# Patient Record
Sex: Female | Born: 1970
Health system: Southern US, Community
[De-identification: ages and names within clinical notes are randomized; demographics above are authoritative.]

## PROBLEM LIST (undated history)

## (undated) DIAGNOSIS — B1910 Unspecified viral hepatitis B without hepatic coma: Secondary | ICD-10-CM

## (undated) DIAGNOSIS — E119 Type 2 diabetes mellitus without complications: Secondary | ICD-10-CM

## (undated) DIAGNOSIS — F419 Anxiety disorder, unspecified: Secondary | ICD-10-CM

## (undated) DIAGNOSIS — C539 Malignant neoplasm of cervix uteri, unspecified: Secondary | ICD-10-CM

## (undated) DIAGNOSIS — F410 Panic disorder [episodic paroxysmal anxiety] without agoraphobia: Secondary | ICD-10-CM

## (undated) DIAGNOSIS — F319 Bipolar disorder, unspecified: Secondary | ICD-10-CM

## (undated) DIAGNOSIS — IMO0002 Reserved for concepts with insufficient information to code with codable children: Secondary | ICD-10-CM

## (undated) DIAGNOSIS — N390 Urinary tract infection, site not specified: Secondary | ICD-10-CM

## (undated) DIAGNOSIS — N83209 Unspecified ovarian cyst, unspecified side: Secondary | ICD-10-CM

## (undated) HISTORY — PX: CERVIX SURGERY: SHX593

## (undated) HISTORY — PX: TUBAL LIGATION: SHX77

## (undated) HISTORY — PX: WISDOM TOOTH EXTRACTION: SHX21

## (undated) HISTORY — PX: MANDIBLE FRACTURE SURGERY: SHX706

---

## 1997-11-08 ENCOUNTER — Emergency Department (HOSPITAL_COMMUNITY): Admission: EM | Admit: 1997-11-08 | Discharge: 1997-11-08 | Payer: Self-pay | Admitting: Emergency Medicine

## 1998-01-11 ENCOUNTER — Encounter: Admission: RE | Admit: 1998-01-11 | Discharge: 1998-01-11 | Payer: Self-pay | Admitting: Family Medicine

## 1998-02-01 ENCOUNTER — Encounter: Admission: RE | Admit: 1998-02-01 | Discharge: 1998-02-01 | Payer: Self-pay | Admitting: Family Medicine

## 1998-05-08 ENCOUNTER — Emergency Department (HOSPITAL_COMMUNITY): Admission: EM | Admit: 1998-05-08 | Discharge: 1998-05-08 | Payer: Self-pay | Admitting: Emergency Medicine

## 1998-05-09 ENCOUNTER — Encounter: Admission: RE | Admit: 1998-05-09 | Discharge: 1998-05-09 | Payer: Self-pay | Admitting: Family Medicine

## 1998-05-17 ENCOUNTER — Encounter: Admission: RE | Admit: 1998-05-17 | Discharge: 1998-05-17 | Payer: Self-pay | Admitting: Family Medicine

## 1998-05-28 ENCOUNTER — Encounter: Admission: RE | Admit: 1998-05-28 | Discharge: 1998-05-28 | Payer: Self-pay | Admitting: Family Medicine

## 1998-07-15 ENCOUNTER — Inpatient Hospital Stay (HOSPITAL_COMMUNITY): Admission: EM | Admit: 1998-07-15 | Discharge: 1998-07-17 | Payer: Self-pay | Admitting: Emergency Medicine

## 1998-07-18 ENCOUNTER — Encounter: Admission: RE | Admit: 1998-07-18 | Discharge: 1998-07-18 | Payer: Self-pay | Admitting: Family Medicine

## 1998-08-23 ENCOUNTER — Encounter: Admission: RE | Admit: 1998-08-23 | Discharge: 1998-08-23 | Payer: Self-pay | Admitting: Family Medicine

## 1999-03-10 ENCOUNTER — Encounter: Admission: RE | Admit: 1999-03-10 | Discharge: 1999-03-10 | Payer: Self-pay | Admitting: Family Medicine

## 1999-12-27 ENCOUNTER — Emergency Department (HOSPITAL_COMMUNITY): Admission: EM | Admit: 1999-12-27 | Discharge: 1999-12-27 | Payer: Self-pay | Admitting: Emergency Medicine

## 1999-12-28 ENCOUNTER — Emergency Department (HOSPITAL_COMMUNITY): Admission: EM | Admit: 1999-12-28 | Discharge: 1999-12-28 | Payer: Self-pay | Admitting: Emergency Medicine

## 1999-12-30 ENCOUNTER — Emergency Department (HOSPITAL_COMMUNITY): Admission: EM | Admit: 1999-12-30 | Discharge: 1999-12-30 | Payer: Self-pay | Admitting: Emergency Medicine

## 1999-12-30 ENCOUNTER — Encounter: Payer: Self-pay | Admitting: Emergency Medicine

## 2000-03-15 ENCOUNTER — Encounter: Admission: RE | Admit: 2000-03-15 | Discharge: 2000-03-15 | Payer: Self-pay | Admitting: Family Medicine

## 2000-04-09 ENCOUNTER — Encounter: Admission: RE | Admit: 2000-04-09 | Discharge: 2000-04-09 | Payer: Self-pay | Admitting: Family Medicine

## 2002-08-04 ENCOUNTER — Encounter: Admission: RE | Admit: 2002-08-04 | Discharge: 2002-08-04 | Payer: Self-pay | Admitting: Family Medicine

## 2002-08-13 ENCOUNTER — Emergency Department (HOSPITAL_COMMUNITY): Admission: EM | Admit: 2002-08-13 | Discharge: 2002-08-13 | Payer: Self-pay | Admitting: Emergency Medicine

## 2002-08-13 ENCOUNTER — Encounter: Payer: Self-pay | Admitting: Emergency Medicine

## 2002-09-18 ENCOUNTER — Encounter: Admission: RE | Admit: 2002-09-18 | Discharge: 2002-09-18 | Payer: Self-pay | Admitting: Family Medicine

## 2002-10-02 ENCOUNTER — Emergency Department (HOSPITAL_COMMUNITY): Admission: EM | Admit: 2002-10-02 | Discharge: 2002-10-02 | Payer: Self-pay | Admitting: Emergency Medicine

## 2002-10-02 ENCOUNTER — Encounter: Payer: Self-pay | Admitting: Emergency Medicine

## 2003-06-18 ENCOUNTER — Encounter: Admission: RE | Admit: 2003-06-18 | Discharge: 2003-06-18 | Payer: Self-pay | Admitting: Sports Medicine

## 2003-06-28 ENCOUNTER — Encounter: Admission: RE | Admit: 2003-06-28 | Discharge: 2003-06-28 | Payer: Self-pay | Admitting: Family Medicine

## 2003-10-30 ENCOUNTER — Emergency Department (HOSPITAL_COMMUNITY): Admission: EM | Admit: 2003-10-30 | Discharge: 2003-10-30 | Payer: Self-pay | Admitting: Emergency Medicine

## 2003-12-06 ENCOUNTER — Encounter: Admission: RE | Admit: 2003-12-06 | Discharge: 2003-12-06 | Payer: Self-pay | Admitting: Family Medicine

## 2003-12-12 ENCOUNTER — Emergency Department (HOSPITAL_COMMUNITY): Admission: EM | Admit: 2003-12-12 | Discharge: 2003-12-12 | Payer: Self-pay | Admitting: *Deleted

## 2004-03-10 ENCOUNTER — Emergency Department (HOSPITAL_COMMUNITY): Admission: EM | Admit: 2004-03-10 | Discharge: 2004-03-10 | Payer: Self-pay | Admitting: Emergency Medicine

## 2005-03-02 ENCOUNTER — Emergency Department (HOSPITAL_COMMUNITY): Admission: EM | Admit: 2005-03-02 | Discharge: 2005-03-02 | Payer: Self-pay | Admitting: Emergency Medicine

## 2005-03-23 ENCOUNTER — Ambulatory Visit: Payer: Self-pay | Admitting: Family Medicine

## 2005-11-14 ENCOUNTER — Emergency Department (HOSPITAL_COMMUNITY): Admission: EM | Admit: 2005-11-14 | Discharge: 2005-11-14 | Payer: Self-pay | Admitting: Emergency Medicine

## 2005-12-11 ENCOUNTER — Emergency Department (HOSPITAL_COMMUNITY): Admission: EM | Admit: 2005-12-11 | Discharge: 2005-12-11 | Payer: Self-pay | Admitting: Emergency Medicine

## 2006-01-22 ENCOUNTER — Ambulatory Visit: Payer: Self-pay | Admitting: Family Medicine

## 2006-03-19 ENCOUNTER — Emergency Department (HOSPITAL_COMMUNITY): Admission: EM | Admit: 2006-03-19 | Discharge: 2006-03-19 | Payer: Self-pay | Admitting: Emergency Medicine

## 2006-03-22 ENCOUNTER — Ambulatory Visit: Payer: Self-pay | Admitting: Family Medicine

## 2006-05-01 ENCOUNTER — Encounter (INDEPENDENT_AMBULATORY_CARE_PROVIDER_SITE_OTHER): Payer: Self-pay | Admitting: *Deleted

## 2006-05-17 ENCOUNTER — Encounter (INDEPENDENT_AMBULATORY_CARE_PROVIDER_SITE_OTHER): Payer: Self-pay | Admitting: *Deleted

## 2006-05-17 ENCOUNTER — Ambulatory Visit: Payer: Self-pay | Admitting: Family Medicine

## 2006-07-29 DIAGNOSIS — M545 Low back pain: Secondary | ICD-10-CM

## 2006-07-29 DIAGNOSIS — F3160 Bipolar disorder, current episode mixed, unspecified: Secondary | ICD-10-CM

## 2006-07-29 DIAGNOSIS — G8929 Other chronic pain: Secondary | ICD-10-CM

## 2006-07-29 DIAGNOSIS — E669 Obesity, unspecified: Secondary | ICD-10-CM | POA: Insufficient documentation

## 2006-07-29 DIAGNOSIS — F411 Generalized anxiety disorder: Secondary | ICD-10-CM | POA: Insufficient documentation

## 2006-07-29 DIAGNOSIS — F17200 Nicotine dependence, unspecified, uncomplicated: Secondary | ICD-10-CM

## 2006-07-30 ENCOUNTER — Encounter (INDEPENDENT_AMBULATORY_CARE_PROVIDER_SITE_OTHER): Payer: Self-pay | Admitting: *Deleted

## 2006-08-21 ENCOUNTER — Emergency Department (HOSPITAL_COMMUNITY): Admission: EM | Admit: 2006-08-21 | Discharge: 2006-08-21 | Payer: Self-pay | Admitting: Emergency Medicine

## 2006-11-01 ENCOUNTER — Telehealth: Payer: Self-pay | Admitting: Family Medicine

## 2006-11-22 ENCOUNTER — Encounter (INDEPENDENT_AMBULATORY_CARE_PROVIDER_SITE_OTHER): Payer: Self-pay | Admitting: *Deleted

## 2006-11-25 ENCOUNTER — Telehealth: Payer: Self-pay | Admitting: Family Medicine

## 2006-12-27 ENCOUNTER — Ambulatory Visit: Payer: Self-pay | Admitting: Family Medicine

## 2007-02-07 ENCOUNTER — Telehealth: Payer: Self-pay | Admitting: Family Medicine

## 2007-03-11 ENCOUNTER — Telehealth (INDEPENDENT_AMBULATORY_CARE_PROVIDER_SITE_OTHER): Payer: Self-pay | Admitting: *Deleted

## 2007-04-04 ENCOUNTER — Ambulatory Visit: Payer: Self-pay | Admitting: Family Medicine

## 2007-04-04 DIAGNOSIS — F1911 Other psychoactive substance abuse, in remission: Secondary | ICD-10-CM

## 2007-05-06 ENCOUNTER — Ambulatory Visit: Payer: Self-pay | Admitting: Family Medicine

## 2007-06-17 ENCOUNTER — Ambulatory Visit: Payer: Self-pay | Admitting: Family Medicine

## 2007-07-26 ENCOUNTER — Telehealth: Payer: Self-pay | Admitting: *Deleted

## 2007-07-27 ENCOUNTER — Ambulatory Visit: Payer: Self-pay | Admitting: Family Medicine

## 2007-08-24 ENCOUNTER — Telehealth (INDEPENDENT_AMBULATORY_CARE_PROVIDER_SITE_OTHER): Payer: Self-pay | Admitting: *Deleted

## 2007-09-14 ENCOUNTER — Telehealth: Payer: Self-pay | Admitting: Family Medicine

## 2007-10-14 ENCOUNTER — Encounter: Payer: Self-pay | Admitting: *Deleted

## 2007-11-11 ENCOUNTER — Telehealth: Payer: Self-pay | Admitting: Family Medicine

## 2007-11-18 ENCOUNTER — Ambulatory Visit: Payer: Self-pay | Admitting: Family Medicine

## 2008-01-04 ENCOUNTER — Encounter: Payer: Self-pay | Admitting: Family Medicine

## 2008-01-09 ENCOUNTER — Telehealth: Payer: Self-pay | Admitting: Family Medicine

## 2008-01-20 ENCOUNTER — Ambulatory Visit: Payer: Self-pay | Admitting: Family Medicine

## 2008-01-20 LAB — CONVERTED CEMR LAB
Nitrite: POSITIVE
Protein, U semiquant: 30
Specific Gravity, Urine: 1.025
Urobilinogen, UA: >=8

## 2008-01-23 ENCOUNTER — Encounter: Payer: Self-pay | Admitting: Family Medicine

## 2008-01-23 LAB — CONVERTED CEMR LAB
ALT: 934 units/L — ABNORMAL HIGH (ref 0–35)
AST: 553 units/L — ABNORMAL HIGH (ref 0–37)
Alkaline Phosphatase: 316 units/L — ABNORMAL HIGH (ref 39–117)
Benzodiazepines.: POSITIVE — AB
Chloride: 105 meq/L (ref 96–112)
Creatinine, Ser: 0.66 mg/dL (ref 0.40–1.20)
Ethyl Alcohol: <10 mg/dL (ref <10)
HDL: 27 mg/dL — ABNORMAL LOW (ref >39)
Methadone: NEGATIVE
Phencyclidine (PCP): NEGATIVE
Propoxyphene: NEGATIVE
Total Bilirubin: 1.4 mg/dL — ABNORMAL HIGH (ref 0.3–1.2)
Total CHOL/HDL Ratio: 3.9
VLDL: 21 mg/dL (ref 0–40)

## 2008-01-24 ENCOUNTER — Telehealth: Payer: Self-pay | Admitting: *Deleted

## 2008-01-26 ENCOUNTER — Ambulatory Visit (HOSPITAL_COMMUNITY): Admission: RE | Admit: 2008-01-26 | Discharge: 2008-01-26 | Payer: Self-pay | Admitting: Family Medicine

## 2008-01-26 ENCOUNTER — Ambulatory Visit: Payer: Self-pay | Admitting: Family Medicine

## 2008-01-26 ENCOUNTER — Encounter: Payer: Self-pay | Admitting: Family Medicine

## 2008-01-26 LAB — CONVERTED CEMR LAB
Hep A Total Ab: NEGATIVE
INR: 1.1 (ref 0.0–1.5)
Prothrombin Time: 14.1 s (ref 11.6–15.2)

## 2008-01-27 ENCOUNTER — Encounter: Payer: Self-pay | Admitting: Family Medicine

## 2008-01-27 ENCOUNTER — Telehealth: Payer: Self-pay | Admitting: *Deleted

## 2008-01-27 LAB — CONVERTED CEMR LAB: Hep B E Ag: POSITIVE — AB

## 2008-01-31 ENCOUNTER — Ambulatory Visit: Payer: Self-pay | Admitting: Family Medicine

## 2008-01-31 DIAGNOSIS — B1910 Unspecified viral hepatitis B without hepatic coma: Secondary | ICD-10-CM | POA: Insufficient documentation

## 2008-02-01 LAB — CONVERTED CEMR LAB
AST: 901 units/L — ABNORMAL HIGH (ref 0–37)
Albumin: 4 g/dL (ref 3.5–5.2)
Alkaline Phosphatase: 271 units/L — ABNORMAL HIGH (ref 39–117)
Bilirubin, Direct: 1.9 mg/dL — ABNORMAL HIGH (ref 0.0–0.3)
Hep B Core Total Ab: POSITIVE — AB
Total Bilirubin: 3.7 mg/dL — ABNORMAL HIGH (ref 0.3–1.2)

## 2008-02-13 ENCOUNTER — Ambulatory Visit: Payer: Self-pay | Admitting: Family Medicine

## 2008-03-21 ENCOUNTER — Telehealth (INDEPENDENT_AMBULATORY_CARE_PROVIDER_SITE_OTHER): Payer: Self-pay | Admitting: *Deleted

## 2008-03-22 ENCOUNTER — Telehealth (INDEPENDENT_AMBULATORY_CARE_PROVIDER_SITE_OTHER): Payer: Self-pay | Admitting: *Deleted

## 2008-03-23 ENCOUNTER — Ambulatory Visit: Payer: Self-pay | Admitting: Family Medicine

## 2008-04-20 ENCOUNTER — Ambulatory Visit: Payer: Self-pay | Admitting: Family Medicine

## 2008-05-18 ENCOUNTER — Telehealth: Payer: Self-pay | Admitting: Family Medicine

## 2008-06-15 ENCOUNTER — Telehealth (INDEPENDENT_AMBULATORY_CARE_PROVIDER_SITE_OTHER): Payer: Self-pay | Admitting: *Deleted

## 2008-06-15 ENCOUNTER — Telehealth: Payer: Self-pay | Admitting: Family Medicine

## 2008-07-13 ENCOUNTER — Ambulatory Visit: Payer: Self-pay | Admitting: Family Medicine

## 2008-07-13 LAB — CONVERTED CEMR LAB
ALT: 10 units/L (ref 0–35)
Albumin: 4.3 g/dL (ref 3.5–5.2)
Alkaline Phosphatase: 49 units/L (ref 39–117)
Glucose, Bld: 88 mg/dL (ref 70–99)
Potassium: 4.6 meq/L (ref 3.5–5.3)
Sodium: 138 meq/L (ref 135–145)
Total Bilirubin: 0.5 mg/dL (ref 0.3–1.2)
Total Protein: 7 g/dL (ref 6.0–8.3)

## 2008-07-16 ENCOUNTER — Encounter: Payer: Self-pay | Admitting: Family Medicine

## 2008-08-09 ENCOUNTER — Telehealth: Payer: Self-pay | Admitting: Family Medicine

## 2008-09-07 ENCOUNTER — Ambulatory Visit: Payer: Self-pay | Admitting: Family Medicine

## 2008-10-05 ENCOUNTER — Telehealth: Payer: Self-pay | Admitting: *Deleted

## 2008-11-02 ENCOUNTER — Telehealth: Payer: Self-pay | Admitting: *Deleted

## 2008-11-29 ENCOUNTER — Telehealth: Payer: Self-pay | Admitting: Family Medicine

## 2008-12-04 ENCOUNTER — Telehealth: Payer: Self-pay | Admitting: *Deleted

## 2009-01-02 ENCOUNTER — Ambulatory Visit: Payer: Self-pay | Admitting: Family Medicine

## 2009-01-29 ENCOUNTER — Telehealth (INDEPENDENT_AMBULATORY_CARE_PROVIDER_SITE_OTHER): Payer: Self-pay | Admitting: *Deleted

## 2009-02-26 ENCOUNTER — Telehealth: Payer: Self-pay | Admitting: Family Medicine

## 2009-02-27 ENCOUNTER — Telehealth: Payer: Self-pay | Admitting: Family Medicine

## 2009-03-22 ENCOUNTER — Encounter: Payer: Self-pay | Admitting: Family Medicine

## 2009-03-22 ENCOUNTER — Ambulatory Visit (HOSPITAL_COMMUNITY): Admission: RE | Admit: 2009-03-22 | Discharge: 2009-03-22 | Payer: Self-pay | Admitting: Family Medicine

## 2009-03-22 ENCOUNTER — Ambulatory Visit: Payer: Self-pay | Admitting: Family Medicine

## 2009-03-22 LAB — CONVERTED CEMR LAB
ALT: 14 units/L (ref 0–35)
AST: 14 units/L (ref 0–37)
Albumin: 4.6 g/dL (ref 3.5–5.2)
Alkaline Phosphatase: 49 units/L (ref 39–117)
Amphetamine Screen, Ur: NEGATIVE
Barbiturate Quant, Ur: NEGATIVE
Benzodiazepines.: POSITIVE — AB
Glucose, Bld: 96 mg/dL (ref 70–99)
MCHC: 33.4 g/dL (ref 30.0–36.0)
MCV: 95.4 fL (ref 78.0–100.0)
Marijuana Metabolite: NEGATIVE
Methadone: NEGATIVE
Opiate Screen, Urine: NEGATIVE
Platelets: 289 10*3/uL (ref 150–400)
Potassium: 3.8 meq/L (ref 3.5–5.3)
Propoxyphene: NEGATIVE
RBC: 4.11 M/uL (ref 3.87–5.11)
RDW: 13.2 % (ref 11.5–15.5)
Sodium: 143 meq/L (ref 135–145)
Total Bilirubin: 0.6 mg/dL (ref 0.3–1.2)
Total Protein: 7.1 g/dL (ref 6.0–8.3)

## 2009-03-25 ENCOUNTER — Encounter: Payer: Self-pay | Admitting: Family Medicine

## 2009-03-26 ENCOUNTER — Encounter: Payer: Self-pay | Admitting: Family Medicine

## 2009-04-03 ENCOUNTER — Telehealth: Payer: Self-pay | Admitting: Family Medicine

## 2009-04-08 ENCOUNTER — Telehealth: Payer: Self-pay | Admitting: Family Medicine

## 2009-04-19 ENCOUNTER — Ambulatory Visit: Payer: Self-pay | Admitting: Family Medicine

## 2009-08-19 ENCOUNTER — Telehealth: Payer: Self-pay | Admitting: Family Medicine

## 2009-10-09 ENCOUNTER — Encounter: Payer: Self-pay | Admitting: *Deleted

## 2010-04-01 ENCOUNTER — Telehealth: Payer: Self-pay | Admitting: Family Medicine

## 2010-04-02 ENCOUNTER — Ambulatory Visit: Payer: Self-pay | Admitting: Family Medicine

## 2010-04-02 LAB — CONVERTED CEMR LAB

## 2010-04-09 ENCOUNTER — Encounter: Payer: Self-pay | Admitting: Family Medicine

## 2010-04-09 DIAGNOSIS — R87611 Atypical squamous cells cannot exclude high grade squamous intraepithelial lesion on cytologic smear of cervix (ASC-H): Secondary | ICD-10-CM

## 2010-04-14 ENCOUNTER — Telehealth (INDEPENDENT_AMBULATORY_CARE_PROVIDER_SITE_OTHER): Payer: Self-pay | Admitting: *Deleted

## 2010-04-18 ENCOUNTER — Telehealth (INDEPENDENT_AMBULATORY_CARE_PROVIDER_SITE_OTHER): Payer: Self-pay | Admitting: *Deleted

## 2010-04-19 ENCOUNTER — Emergency Department (HOSPITAL_BASED_OUTPATIENT_CLINIC_OR_DEPARTMENT_OTHER): Admission: EM | Admit: 2010-04-19 | Discharge: 2010-04-19 | Payer: Self-pay | Admitting: Emergency Medicine

## 2010-04-19 ENCOUNTER — Ambulatory Visit: Payer: Self-pay | Admitting: Interventional Radiology

## 2010-05-01 ENCOUNTER — Encounter: Payer: Self-pay | Admitting: Family Medicine

## 2010-05-01 ENCOUNTER — Ambulatory Visit: Payer: Self-pay | Admitting: Family Medicine

## 2010-05-05 ENCOUNTER — Telehealth: Payer: Self-pay | Admitting: *Deleted

## 2010-05-07 ENCOUNTER — Encounter: Payer: Self-pay | Admitting: Family Medicine

## 2010-06-06 ENCOUNTER — Emergency Department (HOSPITAL_BASED_OUTPATIENT_CLINIC_OR_DEPARTMENT_OTHER)
Admission: EM | Admit: 2010-06-06 | Discharge: 2010-06-07 | Payer: Self-pay | Source: Home / Self Care | Admitting: Emergency Medicine

## 2010-06-16 ENCOUNTER — Telehealth: Payer: Self-pay | Admitting: *Deleted

## 2010-06-16 ENCOUNTER — Telehealth: Payer: Self-pay | Admitting: Family Medicine

## 2010-06-22 ENCOUNTER — Encounter: Payer: Self-pay | Admitting: Family Medicine

## 2010-06-24 ENCOUNTER — Telehealth: Payer: Self-pay | Admitting: *Deleted

## 2010-07-01 ENCOUNTER — Emergency Department (INDEPENDENT_AMBULATORY_CARE_PROVIDER_SITE_OTHER)
Admission: EM | Admit: 2010-07-01 | Discharge: 2010-07-01 | Payer: Self-pay | Source: Home / Self Care | Admitting: Emergency Medicine

## 2010-07-01 DIAGNOSIS — H5710 Ocular pain, unspecified eye: Secondary | ICD-10-CM

## 2010-07-01 DIAGNOSIS — R51 Headache: Secondary | ICD-10-CM

## 2010-07-01 NOTE — Progress Notes (Signed)
Summary: [facility] hospital  Phone Note From Other Clinic   Caller: PA from [facility] Summary of Call: Called to let Dr. Leveda Anna know the pt has been seen at [facility] multiple times in the past few weeks for abdominal pain.  She has been given numerous scripts for percocet and multiple imagining studies have proven to be unremarkable.  Pt told the PA at [facility] that Dr. Leveda Anna was out of town for several weeks and could not get an appt with him, thereofre she needed more percocet from the ED tonight.  Review of Centricity showes appt scheduled already for tomorrow 04-02-10 @ 1:45pm with Dr. Leveda Anna.  Informed PA of this appt.  He appreciated the information and stated he would not give the pt more narcotics, but inform her of her appt tomorrow to follow up with PCP.  Will forward note to PCP. Initial call taken by: Alvia Grove DO,  April 01, 2010 9:59 PM

## 2010-07-01 NOTE — Progress Notes (Signed)
Summary: triage  Phone Note Call from Patient Call back at [phone]   Caller: Patient Summary of Call: navel is red and is leaking brownish/red stuff - "looks like snot" Initial call taken by: De Nurse,  April 18, 2010 11:55 AM  Follow-up for Phone Call        patient states naval area  is sore to touch and red and draining. advised to go to urgent care. states she has no insurance and they will not see her..advised to go to ER to be checked out if urgent care will not see her. Follow-up by: Theresia Lo RN,  April 18, 2010 12:20 PM

## 2010-07-01 NOTE — Letter (Signed)
Summary: Results Follow-up Letter abnormal pap  [facility]  [address]   [place], [place] [address]   Phone: [phone]  Fax: [phone]    04/09/2010  [address] [place], [place]  [address]  Dear Ms. Daus,   The following are the results of your recent test(s):  Test     Result     Pap Smear    Normal_______  Not Normal_x____       Comments: Joyice Faster, I tried the phone # we have in our system and it was disconnected.  Your pap smear was not normal and you need to be seen in our [facility] clinic by Dr. Jennette Kettle for further testing (a colposcopy).  We need to make sure you are not developing cervical cancer.       _________________________________________________________  Please call for an appointment Call [phone] and ask for an appointment with Dr. Jennette Kettle in [facility].  This is important.  Make sure you follow up   Sincerely,     Doralee Albino MD [facility]           Appended Document: Results Follow-up Letter abnormal pap Will send letter via certified mail.  Phone number does not work.   Clinical Lists Changes  Problems: Removed problem of SCREENING FOR MALIGNANT NEOPLASM OF THE CERVIX (ICD-V76.2) Added new problem of PAP SMER W/ATYPCL SQAUMOUS CELL NOT EXCLD HI GRD (ICD-795.02)
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
## 2010-07-01 NOTE — Progress Notes (Signed)
----   Converted from flag ---- ---- 04/14/2010 1:08 PM, Denny Levy MD wrote: Please schedule her in Dec 1st [facility]. Please call her and tell her I have LOOKED at her chart and there is NO HURRY--she is worried about her abnormal pap--she will NOT lose anything by waiting until Decemeber---or even Fevruary for that matter---it does need to be checked buyt it is not emergent. Try to relax her fears Thanks! PS phone number she left at teh admin office is [phone]  {USER.REALNAME}  {DATETIMESTAMP()} ------------------------------

## 2010-07-01 NOTE — Letter (Signed)
Summary: Generic Letter  [facility]  [address]   [place], [place] [address]   Phone: [phone]  Fax: [phone]    05/07/2010  Kylie [name] [address] [place], [place]  [address]  Dear Kylie [name],  The colposcopy biopsy confirmed the presence of abnormal cells / changes in your cervix. This is a PRE-cancerous condition. YOu do NOT have cervical cancer, but we do need to treat this. My nurse will be calling you with an appointment with a gynecologist.   Likely they will consider a procedure such as LEEP or cone biopsy and I have included some information on both of those procedures.  There is NO acute urgency to be treated---so I would not be concerned if you do not see the gynecologist rigt away. I would ideally want you seen in the next 2 months.  Please call me if you have questions.         Sincerely,   Denny Levy MD  Appended Document: Generic Letter [facility] handouts leep / cone
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
## 2010-07-01 NOTE — Assessment & Plan Note (Signed)
Summary: f/u abnormal pap/bmc   Vital Signs:  Patient profile:   40 year old female Weight:      190 pounds Temp:     98.4 degrees F oral Pulse rate:   91 / minute Pulse rhythm:   regular BP sitting:   111 / 73  (left arm) Cuff size:   regular  Vitals Entered By: Loralee Pacas CMA (May 01, 2010 9:02 AM) CC: abnormal pap Is Patient Diabetic? No   CC:  abnormal pap.  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 Take 1 Tablet By Mouth At Bedtime 2)  Ibuprofen 800 Mg Tabs (Ibuprofen) .... Take 1 Tablet By Mouth Four Times A Day  Allergies: 1)  ! Betadine  Review of Systems       no amenorrhea  Physical Exam  Genitalia:  normal introitus, no external lesions, and no vaginal discharge.   Additional Exam:  Patient given informed consent, signed copy in the chart. Placed in lithotomy position. Cervix viewed with speculum and colposcope. Was the entire squamocolumnar junction seen?yes Any acetowhite lesions noted?yes mild at 12:00--most notabley some abnl vessels Any abnormalities seen with green filter?not done Any abnormalities seen with application of Lugol's solution?not done Was the endocervical canal sampled?no Were any cervical biopsies taken?ues at 12:00 Were there any complications?no COMMENTS: Patient was given post procedure instructions. We will notify her of any results.    Impression & Recommendations:  Problem # 1:  PAP SMER W/ATYPCL SQAUMOUS CELL NOT EXCLD HI GRD (ICD-795.02)  path pending  Orders: Colposcopy w/ biopsy - [facility] [phone]  Complete Medication List: 1)  Cyclobenzaprine Hcl 10 Mg Tabs (Cyclobenzaprine hcl) .... Take 1 tablet by  mouth at bedtime 2)  Ibuprofen 800 Mg Tabs (Ibuprofen) .... Take 1 tablet by mouth four times a day   Orders Added: 1)  Colposcopy w/ biopsy - [facility] [[id number]]

## 2010-07-01 NOTE — Letter (Signed)
Summary: Probation Letter  [facility]  [address]   [place], [place] [address]   Phone: [phone]  Fax: [phone]    10/09/2009  ZAREA DIESING [address] [place], [place]  [address]  Dear Ms. Torrens,  With the goal of better serving all our patients the [facility] is following each patient's missed appointments.  You have missed at least 3 appointments with our practice.If you cannot keep your appointment, we expect you to call at least 24 hours before your appointment time.  Missing appointments prevents other patients from seeing [place] and makes it difficult to provide you with the best possible medical care.      1.   If you miss one more appointment, we will only give you limited medical services. This means we will not call in medication refills, complete a form, or make a referral for you except when you are here for a scheduled office visit.    2.   If you miss 2 or more appointments in the next year, we will dismiss you from our practice.    Our office staff can be reached at [phone] Monday through Friday from 8:30 a.m.-5:00 p.m. and will be glad to schedule your appointment as necessary.    Thank you.   [facility]  Appended Document: Probation Letter cert mailed

## 2010-07-03 NOTE — Miscellaneous (Signed)
Summary: Consent: Colposcopy  Consent: Colposcopy   Imported By: Knox Royalty 06/03/2010 16:03:41  _____________________________________________________________________  External Attachment:    Type:   Image     Comment:   External Document

## 2010-07-03 NOTE — Progress Notes (Signed)
Summary: results  Phone Note Call from Patient Call back at Home Phone [phone]   Caller: Patient Summary of Call: pt is wanting results of test from last week Initial call taken by: De Nurse,  May 05, 2010 12:31 PM  Follow-up for Phone Call        DEAR WHITE TEAM please tell her the colposcopy CONFIRMED taht she does have some abnormal changes in her cervix---as we discussed at her ov this will require follow up at [facility] clinic. Please make appt--referral is in. Her changes are PRE cancerous--she does NOT have cervical cancer but we need to get this treated--she will likely need a procedure called LEEP or cone biopsy. I am sendingher a leter to this effect and will include some information on bothof those procedures. If she has questions, plz get a phone number and best time(s0 for me to call her. Thanks!  Denny Levy MD  May 07, 2010 7:24 AM   Additional Follow-up for Phone Call Additional follow up Details #1::        lvm for pt to return call Additional Follow-up by: Loralee Pacas CMA,  May 08, 2010 5:40 PM    Additional Follow-up for Phone Call Additional follow up Details #2::    left message for pt to return call Follow-up by: Loralee Pacas CMA,  May 09, 2010 10:19 AM  Additional Follow-up for Phone Call Additional follow up Details #3:: Details for Additional Follow-up Action Taken: left message to return call called pt today 12.13.2011 @ 1135am and was asked to call pt back.Loralee Pacas CMA  May 13, 2010 11:38 AM  Additional Follow-up by: Jimmy Footman, CMA,  May 12, 2010 11:02 AM  Spoke with patient and gave info. she states that she did receive letter with info and she will be making an appt.Jimmy Footman, CMA  May 13, 2010 11:45 AM

## 2010-07-03 NOTE — Progress Notes (Signed)
Summary: referral/ LEEP eval not done here  Phone Note Call from Patient   Caller: Patient Call For: [phone]  or [phone] Summary of Call: Kylie [name] calling regarding procedure she need to have per letter she received.  Please let her know if this can be done here at [facility].   She is quite anxious about the entire thing.  Want to have done as quickly as possible.   Initial call taken by: Abundio Miu,  June 16, 2010 1:36 PM  Follow-up for Phone Call        DEAR WHITE TEAM did we ever get this appt set up--I see the order but not the appt date please call her and tell her NO we cannot do the eval for LEEP etc---it has to be done at [facility] see if we can get her set up for that.t  Denny Levy MD  June 16, 2010 3:04 PM   Additional Follow-up for Phone Call Additional follow up Details #1::        referral refaxed to [facility] clinic pt notified that wh will have to set up appt and we will call her with appt information Additional Follow-up by: Loralee Pacas CMA,  June 18, 2010 5:38 PM

## 2010-07-03 NOTE — Progress Notes (Addendum)
  Phone Note Call from Patient   Caller: Patient Call For: [phone]  or [phone] Summary of Call: Baxter Hire wanted to know if you will see her fiance for chest pain.  He is not established here yet. Initial call taken by: Abundio Miu,  June 16, 2010 1:36 PM  Follow-up for Phone Call        Called and LM that I would be happy to see - but am leaving the country for 3 weeks and his chest pain should be evaluated more promptly. Follow-up by: Doralee Albino MD,  June 16, 2010 2:16 PM     Appended Document:  called and informed pt of her appt at [facility]

## 2010-07-10 ENCOUNTER — Telehealth: Payer: Self-pay | Admitting: Family Medicine

## 2010-07-10 NOTE — Telephone Encounter (Signed)
Called and discussed.  She will call for appointment.

## 2010-07-17 NOTE — Progress Notes (Signed)
Summary: Procedure  Phone Note Call from Patient Call back at Home Phone [phone]   Reason for Call: Talk to Nurse Summary of Call: pt wants to speak with RN about procedure [facility], pt does not want the procedure unless they can give her pain meds.  Initial call taken by: Knox Royalty,  June 24, 2010 3:54 PM  Follow-up for Phone Call        lvm for pt to return call Follow-up by: Loralee Pacas CMA,  June 26, 2010 12:32 PM  Additional Follow-up for Phone Call Additional follow up Details #1::        lvm for pt to return call Additional Follow-up by: Loralee Pacas CMA,  June 27, 2010 10:20 AM    Additional Follow-up for Phone Call Additional follow up Details #2::    pt not availible lm with boyfriend Follow-up by: Loralee Pacas CMA,  June 27, 2010 5:21 PM  Additional Follow-up for Phone Call Additional follow up Details #3:: Details for Additional Follow-up Action Taken: lvm for pt to return call Additional Follow-up by: Loralee Pacas CMA,  July 01, 2010 10:37 AM

## 2010-07-23 ENCOUNTER — Encounter: Payer: Self-pay | Admitting: Family Medicine

## 2010-07-23 DIAGNOSIS — R87613 High grade squamous intraepithelial lesion on cytologic smear of cervix (HGSIL): Secondary | ICD-10-CM

## 2010-07-23 LAB — CONVERTED CEMR LAB: Chlamydia, DNA Probe: NEGATIVE

## 2010-07-23 NOTE — Telephone Encounter (Signed)
This encounter was created in error - please disregard.

## 2010-07-24 ENCOUNTER — Encounter: Payer: Self-pay | Admitting: Family Medicine

## 2010-07-24 LAB — CONVERTED CEMR LAB: Clue Cells Wet Prep HPF POC: NONE SEEN

## 2010-08-08 ENCOUNTER — Ambulatory Visit: Payer: Self-pay | Admitting: Family Medicine

## 2010-08-12 LAB — BASIC METABOLIC PANEL
CO2: 27 mEq/L (ref 19–32)
Chloride: 106 mEq/L (ref 96–112)
GFR calc Af Amer: >60 mL/min (ref >60)
Potassium: 4.1 mEq/L (ref 3.5–5.1)

## 2010-08-12 LAB — URINALYSIS, ROUTINE W REFLEX MICROSCOPIC
Bilirubin Urine: NEGATIVE
Glucose, UA: NEGATIVE mg/dL
Ketones, ur: NEGATIVE mg/dL
Nitrite: POSITIVE — AB
Protein, ur: NEGATIVE mg/dL
pH: 5.5 (ref 5.0–8.0)

## 2010-08-12 LAB — CBC
MCH: 31.8 pg (ref 26.0–34.0)
MCHC: 34 g/dL (ref 30.0–36.0)
Platelets: 212 10*3/uL (ref 150–400)
RBC: 3.96 MIL/uL (ref 3.87–5.11)
RDW: 12.6 % (ref 11.5–15.5)

## 2010-08-12 LAB — DIFFERENTIAL
Basophils Absolute: 0.1 10*3/uL (ref 0.0–0.1)
Basophils Relative: 1 % (ref 0–1)
Eosinophils Absolute: 0.1 10*3/uL (ref 0.0–0.7)
Lymphs Abs: 2.1 10*3/uL (ref 0.7–4.0)
Neutrophils Relative %: 48 % (ref 43–77)

## 2010-08-12 LAB — URINE MICROSCOPIC-ADD ON

## 2010-08-12 LAB — PREGNANCY, URINE: Preg Test, Ur: NEGATIVE

## 2010-08-15 ENCOUNTER — Other Ambulatory Visit: Payer: Self-pay | Admitting: Obstetrics and Gynecology

## 2010-08-15 ENCOUNTER — Other Ambulatory Visit (HOSPITAL_COMMUNITY)
Admission: RE | Admit: 2010-08-15 | Discharge: 2010-08-15 | Disposition: A | Discharge status: Home / Self Care | Payer: Self-pay | Source: Ambulatory Visit | Attending: Family Medicine | Admitting: Family Medicine

## 2010-08-15 ENCOUNTER — Encounter (INDEPENDENT_AMBULATORY_CARE_PROVIDER_SITE_OTHER): Payer: Self-pay | Admitting: Obstetrics and Gynecology

## 2010-08-15 DIAGNOSIS — R87613 High grade squamous intraepithelial lesion on cytologic smear of cervix (HGSIL): Secondary | ICD-10-CM

## 2010-08-15 DIAGNOSIS — R6889 Other general symptoms and signs: Secondary | ICD-10-CM | POA: Insufficient documentation

## 2010-08-15 LAB — POCT PREGNANCY, URINE: Preg Test, Ur: NEGATIVE

## 2010-08-21 ENCOUNTER — Emergency Department (HOSPITAL_BASED_OUTPATIENT_CLINIC_OR_DEPARTMENT_OTHER)
Admission: EM | Admit: 2010-08-21 | Discharge: 2010-08-21 | Disposition: A | Discharge status: Home / Self Care | Payer: Self-pay | Attending: Emergency Medicine | Admitting: Emergency Medicine

## 2010-08-21 DIAGNOSIS — R509 Fever, unspecified: Secondary | ICD-10-CM | POA: Insufficient documentation

## 2010-08-21 DIAGNOSIS — N12 Tubulo-interstitial nephritis, not specified as acute or chronic: Secondary | ICD-10-CM | POA: Insufficient documentation

## 2010-08-21 DIAGNOSIS — G8929 Other chronic pain: Secondary | ICD-10-CM | POA: Insufficient documentation

## 2010-08-21 LAB — PREGNANCY, URINE: Preg Test, Ur: NEGATIVE

## 2010-08-21 LAB — URINALYSIS, ROUTINE W REFLEX MICROSCOPIC
Ketones, ur: NEGATIVE mg/dL
Nitrite: POSITIVE — AB
Protein, ur: NEGATIVE mg/dL
Urobilinogen, UA: 0.2 mg/dL (ref 0.0–1.0)
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
## 2010-08-25 ENCOUNTER — Emergency Department (HOSPITAL_BASED_OUTPATIENT_CLINIC_OR_DEPARTMENT_OTHER)
Admission: EM | Admit: 2010-08-25 | Discharge: 2010-08-25 | Disposition: A | Discharge status: Home / Self Care | Payer: Self-pay | Attending: Emergency Medicine | Admitting: Emergency Medicine

## 2010-08-25 ENCOUNTER — Emergency Department (INDEPENDENT_AMBULATORY_CARE_PROVIDER_SITE_OTHER): Payer: Self-pay

## 2010-08-25 ENCOUNTER — Emergency Department (HOSPITAL_BASED_OUTPATIENT_CLINIC_OR_DEPARTMENT_OTHER): Payer: Self-pay

## 2010-08-25 DIAGNOSIS — Z79899 Other long term (current) drug therapy: Secondary | ICD-10-CM | POA: Insufficient documentation

## 2010-08-25 DIAGNOSIS — F341 Dysthymic disorder: Secondary | ICD-10-CM | POA: Insufficient documentation

## 2010-08-25 DIAGNOSIS — N12 Tubulo-interstitial nephritis, not specified as acute or chronic: Secondary | ICD-10-CM | POA: Insufficient documentation

## 2010-08-25 DIAGNOSIS — R319 Hematuria, unspecified: Secondary | ICD-10-CM | POA: Insufficient documentation

## 2010-08-25 DIAGNOSIS — IMO0002 Reserved for concepts with insufficient information to code with codable children: Secondary | ICD-10-CM | POA: Insufficient documentation

## 2010-08-25 DIAGNOSIS — R109 Unspecified abdominal pain: Secondary | ICD-10-CM

## 2010-08-25 DIAGNOSIS — G8929 Other chronic pain: Secondary | ICD-10-CM | POA: Insufficient documentation

## 2010-08-25 LAB — URINE MICROSCOPIC-ADD ON

## 2010-08-25 LAB — PREGNANCY, URINE: Preg Test, Ur: NEGATIVE

## 2010-08-25 LAB — URINALYSIS, ROUTINE W REFLEX MICROSCOPIC
Glucose, UA: NEGATIVE mg/dL
Nitrite: NEGATIVE
pH: 6 (ref 5.0–8.0)

## 2010-09-03 ENCOUNTER — Other Ambulatory Visit: Payer: Self-pay | Admitting: Obstetrics and Gynecology

## 2010-09-03 ENCOUNTER — Ambulatory Visit: Payer: Self-pay | Admitting: Obstetrics and Gynecology

## 2010-09-03 DIAGNOSIS — R87613 High grade squamous intraepithelial lesion on cytologic smear of cervix (HGSIL): Secondary | ICD-10-CM

## 2010-09-03 LAB — POCT URINALYSIS DIP (DEVICE)
Ketones, ur: NEGATIVE mg/dL
Protein, ur: NEGATIVE mg/dL
Specific Gravity, Urine: >=1.03 (ref 1.005–1.030)
Urobilinogen, UA: 0.2 mg/dL (ref 0.0–1.0)
pH: 5.5 (ref 5.0–8.0)

## 2010-09-04 NOTE — Progress Notes (Signed)
NAMEJAYLIANNA, Kylie [name] NO.:  [id number]  MEDICAL RECORD NO.:  [id number]           PATIENT TYPE:  A  [facility]ATION[facility]                   FACILITY:  [facility]  PHYSICIAN:  Catalina Antigua, MD     DATE OF BIRTH:  09/07/1970  DATE OF SERVICE:  09/03/2010                                 CLINIC NOTE  A 40 year old G4, P2-0-2-2 with history of SH on Pap on April 02, 2010, followed by HGSIL and colposcopic biopsies performed in December 2011, who presents today for results of her LEEP performed on August 15, 2010, which demonstrated HGSIL with negative surgical margins.  All results were explained to the patient.  The patient verbalized understanding.  All questions were answered.  The patient also reports being recently treated for right pyelonephritis and requested a clean catch urine which was negative for nitrates and negative for leukocyte esterase today.  The patient also gives a history of inability to lose weight despite changing in diet and exercising.  The patient requests thyroid to be tested.  TSH was ordered.  I was also explained to the patient that with age, metabolism changes and it may be a lot harder to lose weight than it was in her younger years that combination of good diet and exercise in a more rigid form may be necessary.  The patient verbalized understanding.  All questions were answered.  Plan is to return in 6 months for a repeat Pap smear and also the patient will be contacted with any abnormal results.          ______________________________ Catalina Antigua, MD    PC/MEDQ  D:  09/03/2010  T:  09/04/2010  Job:  [id number]

## 2010-09-10 ENCOUNTER — Ambulatory Visit: Payer: Self-pay | Admitting: Family Medicine

## 2010-09-12 ENCOUNTER — Emergency Department (INDEPENDENT_AMBULATORY_CARE_PROVIDER_SITE_OTHER): Payer: Self-pay

## 2010-09-12 ENCOUNTER — Ambulatory Visit: Payer: Self-pay | Admitting: Family Medicine

## 2010-09-12 ENCOUNTER — Emergency Department (HOSPITAL_BASED_OUTPATIENT_CLINIC_OR_DEPARTMENT_OTHER)
Admission: EM | Admit: 2010-09-12 | Discharge: 2010-09-12 | Disposition: A | Discharge status: Home / Self Care | Payer: Self-pay | Attending: Emergency Medicine | Admitting: Emergency Medicine

## 2010-09-12 ENCOUNTER — Telehealth: Payer: Self-pay | Admitting: Family Medicine

## 2010-09-12 DIAGNOSIS — W19XXXA Unspecified fall, initial encounter: Secondary | ICD-10-CM | POA: Insufficient documentation

## 2010-09-12 DIAGNOSIS — G8929 Other chronic pain: Secondary | ICD-10-CM | POA: Insufficient documentation

## 2010-09-12 DIAGNOSIS — S5000XA Contusion of unspecified elbow, initial encounter: Secondary | ICD-10-CM | POA: Insufficient documentation

## 2010-09-12 DIAGNOSIS — Y92009 Unspecified place in unspecified non-institutional (private) residence as the place of occurrence of the external cause: Secondary | ICD-10-CM | POA: Insufficient documentation

## 2010-09-12 DIAGNOSIS — IMO0002 Reserved for concepts with insufficient information to code with codable children: Secondary | ICD-10-CM | POA: Insufficient documentation

## 2010-09-12 NOTE — Telephone Encounter (Signed)
Pt couldn't keep her appt because her ride didn't show.  Need you to call her at your earliest convenience

## 2010-09-12 NOTE — Telephone Encounter (Signed)
Called and discussed.  Provided letter for her upcoming Medicaid application.

## 2010-09-26 ENCOUNTER — Ambulatory Visit: Payer: Self-pay | Admitting: Family Medicine

## 2010-10-03 ENCOUNTER — Ambulatory Visit: Payer: Self-pay | Admitting: Family Medicine

## 2010-10-31 ENCOUNTER — Telehealth: Payer: Self-pay | Admitting: Family Medicine

## 2010-10-31 NOTE — Telephone Encounter (Signed)
Ms Landry need to see you Tuesday morning to discuss her depression issues.  She have a court hearing that afternoon and want to see you first before going.  Don't have her own transportation, so need to make one trip to [place].  She want you to call her when you receive message

## 2010-11-03 NOTE — Telephone Encounter (Signed)
Attempted to return call.  No answer.  LM.

## 2010-11-04 ENCOUNTER — Telehealth: Payer: Self-pay | Admitting: Family Medicine

## 2010-11-04 NOTE — Telephone Encounter (Signed)
Patient is having discharge from breasts and wants to have an appt with you.  I told her that you don't have any openings for this month, but she insists on only seeing you.  She wanted [place] to check and see if you could squeeze her in sometime soon.

## 2010-11-05 NOTE — Telephone Encounter (Signed)
Called and discussed.  She has been having non bloody breast discharge for years.  Also overdue for mammogram.  Told to come in on 6/27 and I would see.

## 2010-11-08 ENCOUNTER — Emergency Department (HOSPITAL_BASED_OUTPATIENT_CLINIC_OR_DEPARTMENT_OTHER)
Admission: EM | Admit: 2010-11-08 | Discharge: 2010-11-08 | Disposition: A | Discharge status: Home / Self Care | Payer: Self-pay | Attending: Emergency Medicine | Admitting: Emergency Medicine

## 2010-11-08 ENCOUNTER — Emergency Department (INDEPENDENT_AMBULATORY_CARE_PROVIDER_SITE_OTHER): Payer: Self-pay

## 2010-11-08 DIAGNOSIS — R109 Unspecified abdominal pain: Secondary | ICD-10-CM | POA: Insufficient documentation

## 2010-11-08 DIAGNOSIS — G8929 Other chronic pain: Secondary | ICD-10-CM | POA: Insufficient documentation

## 2010-11-08 DIAGNOSIS — N200 Calculus of kidney: Secondary | ICD-10-CM

## 2010-11-08 DIAGNOSIS — M549 Dorsalgia, unspecified: Secondary | ICD-10-CM | POA: Insufficient documentation

## 2010-11-08 DIAGNOSIS — IMO0002 Reserved for concepts with insufficient information to code with codable children: Secondary | ICD-10-CM | POA: Insufficient documentation

## 2010-11-08 LAB — URINE MICROSCOPIC-ADD ON

## 2010-11-08 LAB — URINALYSIS, ROUTINE W REFLEX MICROSCOPIC
Bilirubin Urine: NEGATIVE
Glucose, UA: NEGATIVE mg/dL
Ketones, ur: NEGATIVE mg/dL
Leukocytes, UA: NEGATIVE
Protein, ur: NEGATIVE mg/dL
pH: 6 (ref 5.0–8.0)

## 2010-11-26 ENCOUNTER — Encounter: Payer: Self-pay | Admitting: Family Medicine

## 2010-11-26 ENCOUNTER — Ambulatory Visit (INDEPENDENT_AMBULATORY_CARE_PROVIDER_SITE_OTHER): Payer: Self-pay | Admitting: Family Medicine

## 2010-11-26 VITALS — BP 125/80 | HR 94 | Wt 201.1 lb

## 2010-11-26 DIAGNOSIS — M545 Low back pain: Secondary | ICD-10-CM

## 2010-11-26 DIAGNOSIS — F411 Generalized anxiety disorder: Secondary | ICD-10-CM

## 2010-11-26 DIAGNOSIS — B1910 Unspecified viral hepatitis B without hepatic coma: Secondary | ICD-10-CM

## 2010-11-26 DIAGNOSIS — Z9189 Other specified personal risk factors, not elsewhere classified: Secondary | ICD-10-CM

## 2010-11-26 DIAGNOSIS — N643 Galactorrhea not associated with childbirth: Secondary | ICD-10-CM | POA: Insufficient documentation

## 2010-11-26 LAB — COMPREHENSIVE METABOLIC PANEL
ALT: 15 U/L (ref 0–35)
AST: 13 U/L (ref 0–37)
Albumin: 4.3 g/dL (ref 3.5–5.2)
CO2: 26 mEq/L (ref 19–32)
Calcium: 9.1 mg/dL (ref 8.4–10.5)
Chloride: 109 mEq/L (ref 96–112)
Creat: 0.59 mg/dL (ref 0.50–1.10)
Potassium: 4 mEq/L (ref 3.5–5.3)
Sodium: 139 mEq/L (ref 135–145)
Total Protein: 6.6 g/dL (ref 6.0–8.3)

## 2010-11-26 LAB — PROLACTIN: Prolactin: 5.5 ng/mL

## 2010-11-26 MED ORDER — ALPRAZOLAM 0.5 MG PO TABS: 0.5000 mg | ORAL_TABLET | Freq: Two times a day (BID) | ORAL | Status: DC

## 2010-11-26 MED ORDER — CITALOPRAM HYDROBROMIDE 20 MG PO TABS: 20.0000 mg | ORAL_TABLET | Freq: Every day | ORAL | Status: DC
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
## 2010-11-27 ENCOUNTER — Encounter: Payer: Self-pay | Admitting: Family Medicine

## 2010-11-27 NOTE — Assessment & Plan Note (Addendum)
Stable, continue no narcotic policy.  Will check [place] data base before next visit.

## 2010-11-27 NOTE — Assessment & Plan Note (Signed)
Chronic and poor control.  Restart SSRI.  Also benzo in low dose.  Monitor carefully given hx of drug abuse.

## 2010-11-27 NOTE — Assessment & Plan Note (Signed)
Needs LFT check

## 2010-11-27 NOTE — Assessment & Plan Note (Signed)
Bilateral makes neoplasm unlikely.  Check prolactin.  Will order mammogram when [facility] qualified.

## 2010-11-27 NOTE — Assessment & Plan Note (Signed)
States clean and sober for almost 2 years.

## 2010-12-17 ENCOUNTER — Ambulatory Visit (INDEPENDENT_AMBULATORY_CARE_PROVIDER_SITE_OTHER): Payer: Self-pay | Admitting: Family Medicine

## 2010-12-17 ENCOUNTER — Encounter: Payer: Self-pay | Admitting: Family Medicine

## 2010-12-17 VITALS — BP 123/83 | HR 88 | Temp 97.9°F | Ht 66.0 in | Wt 201.0 lb

## 2010-12-17 DIAGNOSIS — F329 Major depressive disorder, single episode, unspecified: Secondary | ICD-10-CM

## 2010-12-17 DIAGNOSIS — M545 Low back pain, unspecified: Secondary | ICD-10-CM

## 2010-12-17 DIAGNOSIS — F17200 Nicotine dependence, unspecified, uncomplicated: Secondary | ICD-10-CM

## 2010-12-17 DIAGNOSIS — F3289 Other specified depressive episodes: Secondary | ICD-10-CM

## 2010-12-17 DIAGNOSIS — F411 Generalized anxiety disorder: Secondary | ICD-10-CM

## 2010-12-17 MED ORDER — ALPRAZOLAM 1 MG PO TABS: 1.0000 mg | ORAL_TABLET | Freq: Two times a day (BID) | ORAL | Status: DC

## 2010-12-17 MED ORDER — DULOXETINE HCL 60 MG PO CPEP: 60.0000 mg | ORAL_CAPSULE | Freq: Every day | ORAL | Status: DC

## 2010-12-18 ENCOUNTER — Telehealth: Payer: Self-pay | Admitting: Family Medicine

## 2010-12-18 NOTE — Telephone Encounter (Signed)
Pt was seen yesterday and forgot to tell Dr Leveda Anna something and is requesting to speak to him.  Says it's personal.

## 2010-12-19 ENCOUNTER — Encounter (HOSPITAL_BASED_OUTPATIENT_CLINIC_OR_DEPARTMENT_OTHER): Payer: Self-pay | Admitting: *Deleted

## 2010-12-19 ENCOUNTER — Emergency Department (HOSPITAL_BASED_OUTPATIENT_CLINIC_OR_DEPARTMENT_OTHER)
Admission: EM | Admit: 2010-12-19 | Discharge: 2010-12-19 | Disposition: A | Discharge status: Home / Self Care | Payer: Self-pay | Attending: Emergency Medicine | Admitting: Emergency Medicine

## 2010-12-19 DIAGNOSIS — K0889 Other specified disorders of teeth and supporting structures: Secondary | ICD-10-CM

## 2010-12-19 DIAGNOSIS — K089 Disorder of teeth and supporting structures, unspecified: Secondary | ICD-10-CM | POA: Insufficient documentation

## 2010-12-19 HISTORY — DX: Malignant neoplasm of cervix uteri, unspecified: C53.9

## 2010-12-19 HISTORY — DX: Unspecified viral hepatitis B without hepatic coma: B19.10

## 2010-12-19 MED ORDER — HYDROCODONE-ACETAMINOPHEN 5-325 MG PO TABS: 2.0000 | ORAL_TABLET | ORAL | Status: AC | PRN

## 2010-12-19 MED ORDER — PERMETHRIN 5 % EX CREA: TOPICAL_CREAM | CUTANEOUS | Status: DC

## 2010-12-19 MED ORDER — AMOXICILLIN 500 MG PO CAPS: 500.0000 mg | ORAL_CAPSULE | Freq: Three times a day (TID) | ORAL | Status: AC

## 2010-12-19 MED ORDER — HYDROCODONE-ACETAMINOPHEN 5-325 MG PO TABS
2.0000 | ORAL_TABLET | Freq: Once | ORAL | Status: AC
  Administered 2010-12-19: 2 via ORAL
  Filled 2010-12-19: qty 2

## 2010-12-19 NOTE — Telephone Encounter (Signed)
Child with lice. She needs treatment.  Also needs letter re: inability to work for child support court.  States Rx too expensive.  In addition to the letter she requested, I mailed her a handwritten Rx for permethrin to take to the health department.

## 2010-12-19 NOTE — Assessment & Plan Note (Signed)
Chronic, stable add cymbalta

## 2010-12-19 NOTE — ED Provider Notes (Addendum)
History     Chief Complaint  Patient presents with  . Dental Pain   Patient is a 40 y.o. female presenting with tooth pain. The history is provided by the patient.  Dental PainThe primary symptoms include mouth pain, dental injury and oral lesions. The symptoms began 1 to 2 hours ago. The symptoms are worsening. The symptoms occur constantly.  Mouth pain occurs constantly. Mouth pain is worsening. Affected locations include: teeth.  Additional symptoms include: gum tenderness.    Past Medical History  Diagnosis Date  . Hepatitis B infection   . Cervical cancer     Stage 2    Past Surgical History  Procedure Date  . Tubal ligation   . Mandible fracture surgery     History reviewed. No pertinent family history.  History  Substance Use Topics  . Smoking status: Current Everyday Smoker -- 0.5 packs/day    Types: Cigarettes  . Smokeless tobacco: Never Used  . Alcohol Use: No    OB History    Grav Para Term Preterm Abortions TAB SAB Ect Mult Living                  Review of Systems  HENT: Positive for dental problem.   All other systems reviewed and are negative.    Physical Exam  BP 124/82  Pulse 70  Temp(Src) 98 F (36.7 C) (Oral)  Resp 22  Ht 5\' 6"  (1.676 m)  Wt 201 lb (91.173 kg)  BMI 32.44 kg/m2  SpO2 100%  LMP 12/15/2010  Physical Exam  Constitutional: She appears well-developed and well-nourished.  HENT:  Head: Normocephalic and atraumatic.       No sign of infection,  No swelling oral   Eyes: Conjunctivae are normal. Pupils are equal, round, and reactive to light.  Neck: Normal range of motion. Neck supple.  Cardiovascular: Normal rate.   Pulmonary/Chest: Effort normal.  Musculoskeletal: Normal range of motion.  Neurological: She is alert.  Skin: Skin is warm and dry.  Psychiatric: She has a normal mood and affect.    ED Course  Procedures  MDM Pt advised of need to see dentist.  Pt advised she needs to discuss any further narcotic  management with Dr. Leveda Anna.      Langston Masker, [place] 12/19/10 1642  [place], [place] 01/21/11 [phone]

## 2010-12-19 NOTE — Telephone Encounter (Signed)
Kylie [name] called back and said she could not afford the meds called to pharmacy for head lice.  Cost $60.00.  Need to have something else called in.  Let her know when ready.

## 2010-12-19 NOTE — Progress Notes (Signed)
  Subjective:    Patient ID: Kylie [name], female    DOB: [birth year], 40 y.o.   MRN: [id number]  HPI Joyice Faster continues to have what she considers disabling anxiety and back pain.  She has not done well on SSRIs in the past.  She has responded to cymbalta.  She did see Rudell Cobb and now has the orange card. Continues to ask for narcotics and benzos and I remain reluctant due to previous history of cocaine abuse and breaking narcotic contract.      Review of Systems     Objective:   Physical Exam Anxious in office. Low back paraspinus muscle tenderness Nl DTRs       Assessment & Plan:

## 2010-12-19 NOTE — ED Notes (Signed)
Pt states she has had a toothache for the last 4 days from an infected wisdom tooth.  States she took Ibuprofen and percocet 10mg  today with no relief.  States she has an appointment with Dr. Luz Brazen in 2 weeks.

## 2010-12-19 NOTE — Assessment & Plan Note (Signed)
Chronic and poorly controled.  Cymbalta plus increase alprazolam.

## 2010-12-19 NOTE — Assessment & Plan Note (Signed)
States she is too stressed to consider quitting.

## 2010-12-22 ENCOUNTER — Telehealth: Payer: Self-pay | Admitting: Family Medicine

## 2010-12-22 NOTE — Telephone Encounter (Signed)
Informed pt that I will send in a dental referral for her however she will be placed on a waiting list and I am unsure as to how long it will be before she is seen. Pt understood.Marland KitchenMarland KitchenLoralee Pacas [place]

## 2010-12-22 NOTE — Telephone Encounter (Signed)
Was told by Jaynee Eagles that with the [facility] she will need a referral from Dr. Leveda Anna to [facility] # [phone] in order to be seen about a painful Wisdom Tooth.  The ER has put her on Amoxicillan.

## 2010-12-24 ENCOUNTER — Telehealth: Payer: Self-pay | Admitting: Family Medicine

## 2010-12-24 NOTE — Telephone Encounter (Addendum)
Kylie [name] wanted to have something for pain for her tooth.  Already have been taking the antibiotic.  Only have 2 left, but need at least 10 hydrocodone tabs called to pharmacy.  Have an appt @ 1:00 with [facility].  Call and let her know when rx is ready.  Also want to know if papers were sent off for child support.

## 2010-12-25 NOTE — Telephone Encounter (Signed)
Letter is up front for pt to p/u. pcp is not here to obtain more pain meds. Pt informed and understood.Laureen Ochs, Viann Shove

## 2011-01-01 ENCOUNTER — Telehealth: Payer: Self-pay | Admitting: Family Medicine

## 2011-01-01 ENCOUNTER — Emergency Department (HOSPITAL_BASED_OUTPATIENT_CLINIC_OR_DEPARTMENT_OTHER)
Admission: EM | Admit: 2011-01-01 | Discharge: 2011-01-01 | Disposition: A | Discharge status: Home / Self Care | Payer: Self-pay | Attending: Emergency Medicine | Admitting: Emergency Medicine

## 2011-01-01 ENCOUNTER — Encounter (HOSPITAL_BASED_OUTPATIENT_CLINIC_OR_DEPARTMENT_OTHER): Payer: Self-pay | Admitting: *Deleted

## 2011-01-01 DIAGNOSIS — K029 Dental caries, unspecified: Secondary | ICD-10-CM | POA: Insufficient documentation

## 2011-01-01 DIAGNOSIS — F17200 Nicotine dependence, unspecified, uncomplicated: Secondary | ICD-10-CM | POA: Insufficient documentation

## 2011-01-01 DIAGNOSIS — K089 Disorder of teeth and supporting structures, unspecified: Secondary | ICD-10-CM | POA: Insufficient documentation

## 2011-01-01 MED ORDER — HYDROCODONE-ACETAMINOPHEN 5-325 MG PO TABS: 2.0000 | ORAL_TABLET | ORAL | Status: AC | PRN

## 2011-01-01 NOTE — ED Provider Notes (Signed)
Medical screening examination/treatment/procedure(s) were conducted as a shared visit with non-physician practitioner(s) and myself.  I personally evaluated the patient during the encounter  Celene Kras, MD 01/01/11 1445

## 2011-01-01 NOTE — ED Provider Notes (Signed)
History     CSN: [id number] Arrival date & time: 01/01/2011  2:16 PM  Chief Complaint  Patient presents with  . Dental Pain   Patient is a 40 y.o. female presenting with tooth pain. The history is provided by the patient.  Dental PainThe primary symptoms include mouth pain. The symptoms began yesterday. The symptoms are worsening.  Mouth pain began 3 - 5 days ago. Mouth pain occurs constantly. Affected locations include: teeth. At its highest the mouth pain was at 8/10. The mouth pain is currently at 8/10.  Additional symptoms include: gum swelling, gum tenderness and jaw pain.    Past Medical History  Diagnosis Date  . Hepatitis B infection   . Cervical cancer     Stage 2    Past Surgical History  Procedure Date  . Tubal ligation   . Mandible fracture surgery     No family history on file.  History  Substance Use Topics  . Smoking status: Current Everyday Smoker -- 0.5 packs/day    Types: Cigarettes  . Smokeless tobacco: Never Used  . Alcohol Use: No    OB History    Grav Para Term Preterm Abortions TAB SAB Ect Mult Living                  Review of Systems  HENT: Positive for dental problem.   All other systems reviewed and are negative.    Physical Exam  BP 114/80  Pulse 83  Temp(Src) 98.7 F (37.1 C) (Oral)  Resp 20  SpO2 98%  LMP 12/15/2010  Physical Exam  Nursing note and vitals reviewed. Constitutional: She appears well-developed and well-nourished.  HENT:  Head: Normocephalic.       Impacted wisdom tooth,  Eyes: Pupils are equal, round, and reactive to light.  Neck: Normal range of motion.  Cardiovascular: Normal rate.   Pulmonary/Chest: Effort normal.  Abdominal: Soft.  Musculoskeletal: Normal range of motion.  Neurological: She is alert.  Skin: Skin is warm.    ED Course  Procedures  MDM Pt advised she needs to discuss on going pain management with Dr. Leveda Anna.       Langston Masker, [place] 01/01/11 1434

## 2011-01-01 NOTE — Telephone Encounter (Signed)
Dr. Leveda Anna, I know what you probably are gonna say. She is going to have to come in for a visit correct? Plus if she is wanting a referral she will have to come in anyway. So should i call her and tell her that she does? ---[place]

## 2011-01-01 NOTE — ED Notes (Signed)
3 weeks toothache. Referral to oral surgeon on her dental visit last week. Here for pain control

## 2011-01-01 NOTE — Telephone Encounter (Signed)
Has an impacted Wisdom tooth and has to go to the Transport planner.  She is wanting some pain medicine for the tooth until she gets her appt.  I attempted to make an apptointment, but she did not want to make one.  I told her that the doctor would not prescribe pain medicine without an appt.  She wants Dr. Leveda Anna to call her tomorrow morning.

## 2011-01-02 NOTE — Telephone Encounter (Signed)
Did [place] drug database search.  Kylie [name] has gotten narcotics from ER and other providers 4 or 5 times in the last 6 months.  Called and left message.  No narcotics.  I would be willing to prescribe ketoralac and/or pen VK for her dental problems.  She should return call if needs these meds.

## 2011-01-31 NOTE — ED Provider Notes (Signed)
Medical screening examination/treatment/procedure(s) were performed by non-physician practitioner and as supervising physician I was immediately available for consultation/collaboration.  Nat Christen, MD 01/31/11 [phone]

## 2011-02-13 ENCOUNTER — Ambulatory Visit (INDEPENDENT_AMBULATORY_CARE_PROVIDER_SITE_OTHER): Payer: Self-pay | Admitting: Family Medicine

## 2011-02-13 ENCOUNTER — Telehealth: Payer: Self-pay | Admitting: Family Medicine

## 2011-02-13 ENCOUNTER — Encounter: Payer: Self-pay | Admitting: Family Medicine

## 2011-02-13 VITALS — BP 142/73 | HR 91 | Temp 97.4°F | Ht 66.0 in | Wt 202.0 lb

## 2011-02-13 DIAGNOSIS — Z23 Encounter for immunization: Secondary | ICD-10-CM

## 2011-02-13 DIAGNOSIS — R87611 Atypical squamous cells cannot exclude high grade squamous intraepithelial lesion on cytologic smear of cervix (ASC-H): Secondary | ICD-10-CM

## 2011-02-13 DIAGNOSIS — M545 Low back pain: Secondary | ICD-10-CM

## 2011-02-13 MED ORDER — CYCLOBENZAPRINE HCL 10 MG PO TABS: 20.0000 mg | ORAL_TABLET | Freq: Every day | ORAL | Status: DC

## 2011-02-13 NOTE — Progress Notes (Signed)
Addended by: Deno Etienne on: 02/13/2011 05:46 PM   Modules accepted: Orders

## 2011-02-13 NOTE — Progress Notes (Signed)
  Subjective:    Patient ID: Kylie [name], female    DOB: 07-12-1970, 40 y.o.   MRN: [id number]  HPI Due for Pap.  Had one on 04/02/10 which was abnormal.  Had colpo hear on 12/11 then LEEP at [facility] 1/12.  Has not had follow up pap and is overdue.  Unfortunately on menstrual period now.  Promises to come back.  Chronic pain - low back.  She is active and stable.  She still desires me to restart narcotics.      Review of Systems     Objective:   Physical Exam Gen- alert and mildly anxious.  Visably upset when I told no narcotics Lungs clear Abd benign.        Assessment & Plan:

## 2011-02-13 NOTE — Telephone Encounter (Signed)
Pt is asking to speak with Dr Leveda Anna - wants to talk about Tramodol
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
## 2011-02-13 NOTE — Assessment & Plan Note (Signed)
Due for repeat PAP after LEEP but on menses.  She promises to return.

## 2011-02-16 NOTE — Telephone Encounter (Signed)
Called and lvm for pt to call back concerning Tramadol.Loralee Pacas [place]

## 2011-02-19 MED ORDER — TRAMADOL HCL 50 MG PO TABS: 50.0000 mg | ORAL_TABLET | Freq: Two times a day (BID) | ORAL | Status: AC | PRN

## 2011-02-19 NOTE — Telephone Encounter (Signed)
Interested in getting tramadol for her pain. Pt stated that she was "very sorry for being a butt hole and I was acting that way because I couldn't get what I wanted."  She told me that she was in a a lot of pain and could not tolerate it any longer. I told her that I would forward this to Dr. Leveda Anna and give her a call back.Loralee Pacas [place]

## 2011-02-19 NOTE — Telephone Encounter (Signed)
As we discussed at last visit, I am willing to prescribe tramadol but no narcotics.  Rx sent

## 2011-03-16 ENCOUNTER — Telehealth: Payer: Self-pay | Admitting: Family Medicine

## 2011-03-16 NOTE — Telephone Encounter (Signed)
Will forward to dr Leveda Anna.

## 2011-03-16 NOTE — Telephone Encounter (Signed)
Ms. Wiltsie says she needs Dr. Leveda Anna to call her back as soon as possible about her filing for disability being denied.  She wants to talk to him about her [employer] situation, the lawyer will be back in touch with her within 24 hours.

## 2011-03-16 NOTE — Telephone Encounter (Signed)
Called and discussed.  She also has an appointment with me on 10/31.

## 2011-03-17 ENCOUNTER — Emergency Department (HOSPITAL_BASED_OUTPATIENT_CLINIC_OR_DEPARTMENT_OTHER)
Admission: EM | Admit: 2011-03-17 | Discharge: 2011-03-17 | Disposition: A | Discharge status: Home / Self Care | Payer: Self-pay | Attending: Emergency Medicine | Admitting: Emergency Medicine

## 2011-03-17 ENCOUNTER — Encounter (HOSPITAL_BASED_OUTPATIENT_CLINIC_OR_DEPARTMENT_OTHER): Payer: Self-pay | Admitting: *Deleted

## 2011-03-17 DIAGNOSIS — IMO0002 Reserved for concepts with insufficient information to code with codable children: Secondary | ICD-10-CM | POA: Insufficient documentation

## 2011-03-17 DIAGNOSIS — N390 Urinary tract infection, site not specified: Secondary | ICD-10-CM | POA: Insufficient documentation

## 2011-03-17 DIAGNOSIS — M549 Dorsalgia, unspecified: Secondary | ICD-10-CM | POA: Insufficient documentation

## 2011-03-17 DIAGNOSIS — G8929 Other chronic pain: Secondary | ICD-10-CM | POA: Insufficient documentation

## 2011-03-17 DIAGNOSIS — R1032 Left lower quadrant pain: Secondary | ICD-10-CM | POA: Insufficient documentation

## 2011-03-17 HISTORY — DX: Anxiety disorder, unspecified: F41.9

## 2011-03-17 HISTORY — DX: Reserved for concepts with insufficient information to code with codable children: IMO0002

## 2011-03-17 HISTORY — DX: Unspecified ovarian cyst, unspecified side: N83.209

## 2011-03-17 HISTORY — DX: Urinary tract infection, site not specified: N39.0

## 2011-03-17 LAB — BASIC METABOLIC PANEL
BUN: 8 mg/dL (ref 6–23)
GFR calc Af Amer: >90 mL/min (ref >90)
GFR calc non Af Amer: >90 mL/min (ref >90)
Potassium: 3.8 mEq/L (ref 3.5–5.1)

## 2011-03-17 LAB — URINALYSIS, ROUTINE W REFLEX MICROSCOPIC
Bilirubin Urine: NEGATIVE
Glucose, UA: NEGATIVE mg/dL
Ketones, ur: NEGATIVE mg/dL
pH: 6.5 (ref 5.0–8.0)

## 2011-03-17 LAB — URINE MICROSCOPIC-ADD ON

## 2011-03-17 MED ORDER — HYDROCODONE-ACETAMINOPHEN 5-325 MG PO TABS: 1.0000 | ORAL_TABLET | ORAL | Status: AC | PRN

## 2011-03-17 MED ORDER — PHENAZOPYRIDINE HCL 200 MG PO TABS: 200.0000 mg | ORAL_TABLET | Freq: Three times a day (TID) | ORAL | Status: AC

## 2011-03-17 MED ORDER — HYDROCODONE-ACETAMINOPHEN 5-500 MG PO TABS: 1.0000 | ORAL_TABLET | Freq: Four times a day (QID) | ORAL | Status: DC | PRN

## 2011-03-17 MED ORDER — OXYCODONE-ACETAMINOPHEN 5-325 MG PO TABS
1.0000 | ORAL_TABLET | Freq: Once | ORAL | Status: AC
  Administered 2011-03-17: 1 via ORAL
  Filled 2011-03-17: qty 1

## 2011-03-17 MED ORDER — CIPROFLOXACIN HCL 500 MG PO TABS: 500.0000 mg | ORAL_TABLET | Freq: Two times a day (BID) | ORAL | Status: AC

## 2011-03-17 NOTE — ED Notes (Signed)
Patient states she has had uti symptoms for the last 3 weeks, with burning and back pain.  States she fell last night in the shower and now her back pain is worse and left hip pain.

## 2011-03-17 NOTE — ED Provider Notes (Signed)
History     CSN: [id number] Arrival date & time: 03/17/2011  9:47 AM  Chief Complaint  Patient presents with  . Back Pain    s/p fall 03/16/11     (Consider location/radiation/quality/duration/timing/severity/associated sxs/prior treatment) HPI Comments: Pt with chronic low back pain from degenerative disc disease that has been exacerbated by 3 weeks of dysuria and slip and fall last night in the shower.  Reports 2-3 episodes of fever over the past 3 weeks.  Has not previously been seen for this complaint.  Denies abdominal pain, abnormal vaginal discharge or bleeding, change in bowel habits, weakness or numbness of the extremities.  Has taken ibuprofen and occasional percocet without improvement.    Patient is a 40 y.o. female presenting with back pain. The history is provided by the patient. No language interpreter was used.  Back Pain  The current episode started more than 1 week ago. The problem occurs constantly. The pain is present in the lumbar spine. The quality of the pain is described as aching. Associated symptoms include dysuria (Patient has had burning with urination x 3 weeks with decreased urinary output). Pertinent negatives include no abdominal pain, no bowel incontinence, no bladder incontinence, no pelvic pain, no leg pain, no paresthesias, no tingling and no weakness.    Past Medical History  Diagnosis Date  . Hepatitis B infection   . Cervical cancer     Stage 2  . Degenerative disk disease   . Ovarian cyst   . Chronic UTI   . Anxiety     Past Surgical History  Procedure Date  . Tubal ligation   . Mandible fracture surgery   . Wisdom tooth extraction     No family history on file.  History  Substance Use Topics  . Smoking status: Current Everyday Smoker -- 0.5 packs/day for 25 years    Types: Cigarettes  . Smokeless tobacco: Never Used  . Alcohol Use: No    OB History    Grav Para Term Preterm Abortions TAB SAB Ect Mult Living                   Review of Systems  Gastrointestinal: Negative for abdominal pain and bowel incontinence.  Genitourinary: Positive for dysuria (Patient has had burning with urination x 3 weeks with decreased urinary output). Negative for bladder incontinence and pelvic pain.  Musculoskeletal: Positive for back pain. Negative for gait problem.  Neurological: Negative for tingling, weakness and paresthesias.  All other systems reviewed and are negative.    Allergies  Citalopram and Povidone-iodine  Home Medications   Current Outpatient Rx  Name Route Sig Dispense Refill  . ALPRAZOLAM 1 MG PO TABS Oral Take 1 mg by mouth 2 (two) times daily. No early refills     . CYCLOBENZAPRINE HCL 10 MG PO TABS Oral Take 20 mg by mouth at bedtime.      . IBUPROFEN 200 MG PO TABS Oral Take 200 mg by mouth daily. For back pain     . DULOXETINE HCL 60 MG PO CPEP Oral Take 60 mg by mouth daily.        BP 120/85  Pulse 104  Temp(Src) 98.1 F (36.7 C) (Oral)  Resp 22  Ht 5\' 5"  (1.651 m)  Wt 199 lb (90.266 kg)  BMI 33.12 kg/m2  SpO2 100%  LMP 03/03/2011  Physical Exam  Constitutional: She is oriented to person, place, and time. She appears well-developed and well-nourished.  HENT:  Head: Normocephalic and  atraumatic.  Neck: Normal range of motion. Neck supple.  Cardiovascular: Normal rate and regular rhythm.   Pulmonary/Chest: Effort normal and breath sounds normal.  Abdominal: Soft. She exhibits no distension and no mass. There is tenderness in the right lower quadrant, suprapubic area and left lower quadrant. There is CVA tenderness (left). There is no rebound and no guarding.  Musculoskeletal: Normal range of motion. She exhibits no tenderness.       Cervical back: Normal.       Thoracic back: Normal.       Lumbar back: Normal.  Neurological: She is alert and oriented to person, place, and time. She has normal strength. No sensory deficit.    ED Course  Procedures (including critical care  time)  Labs Reviewed  URINALYSIS, ROUTINE W REFLEX MICROSCOPIC - Abnormal; Notable for the following:    Color, Urine AMBER (*) BIOCHEMICALS MAY BE AFFECTED BY COLOR   Appearance CLOUDY (*)    Hgb urine dipstick LARGE (*)    Nitrite POSITIVE (*)    Leukocytes, UA MODERATE (*)    All other components within normal limits  URINE MICROSCOPIC-ADD ON - Abnormal; Notable for the following:    Squamous Epithelial / LPF MANY (*)    Bacteria, UA MANY (*)    All other components within normal limits  BASIC METABOLIC PANEL - Abnormal; Notable for the following:    Glucose, Bld 107 (*)    All other components within normal limits  PREGNANCY, URINE   No results found.   1. Urinary tract infection   2. Chronic back pain       MDM  Pt with chronic back pain and urinary tract infection.  Denies any vaginal symptoms.  Lower abdominal tenderness is diffuse, no localized tenderness.  Spine is nontender, hip is nontender.  Pain is described as aching and unchanged from normal character of chronic back pain.  Pt with UTI symptoms x 3 weeks and recurrent UTIs this year.  Pt has follow up scheduled with PCP.  Have discussed with patient that she may discuss the recurrence of UTIs with PCP and consider urology follow up for further investigation.  Pt verbalizes understanding.          Rise Patience, [place] 03/17/11 [phone]

## 2011-03-17 NOTE — ED Provider Notes (Signed)
History/physical exam/procedure(s) were performed by non-physician practitioner and as supervising physician I was immediately available for consultation/collaboration. I have reviewed all notes and am in agreement with care and plan.   Hilario Quarry, MD 03/17/11 [phone]

## 2011-04-01 ENCOUNTER — Ambulatory Visit (INDEPENDENT_AMBULATORY_CARE_PROVIDER_SITE_OTHER): Payer: Self-pay | Admitting: Family Medicine

## 2011-04-01 ENCOUNTER — Other Ambulatory Visit: Payer: Self-pay | Admitting: Family Medicine

## 2011-04-01 ENCOUNTER — Other Ambulatory Visit (HOSPITAL_COMMUNITY)
Admission: RE | Admit: 2011-04-01 | Discharge: 2011-04-01 | Disposition: A | Discharge status: Home / Self Care | Payer: Self-pay | Source: Ambulatory Visit | Attending: Family Medicine | Admitting: Family Medicine

## 2011-04-01 ENCOUNTER — Encounter: Payer: Self-pay | Admitting: Family Medicine

## 2011-04-01 VITALS — BP 113/68 | HR 80 | Ht 65.0 in | Wt 199.0 lb

## 2011-04-01 DIAGNOSIS — F411 Generalized anxiety disorder: Secondary | ICD-10-CM

## 2011-04-01 DIAGNOSIS — Z124 Encounter for screening for malignant neoplasm of cervix: Secondary | ICD-10-CM

## 2011-04-01 DIAGNOSIS — Z01419 Encounter for gynecological examination (general) (routine) without abnormal findings: Secondary | ICD-10-CM | POA: Insufficient documentation

## 2011-04-01 MED ORDER — ALPRAZOLAM 1 MG PO TABS: 1.0000 mg | ORAL_TABLET | Freq: Three times a day (TID) | ORAL | Status: DC | PRN

## 2011-04-01 NOTE — Telephone Encounter (Signed)
Will forward to Dr Hensel 

## 2011-04-01 NOTE — Telephone Encounter (Signed)
Authorized early refill. 

## 2011-04-01 NOTE — Telephone Encounter (Signed)
Kylie [name] is calling because the Pharmacy won't refill her medication early.  She uses [facility] and they are requesting that Dr. Leveda Anna call to give authorization for early refill.

## 2011-04-02 DIAGNOSIS — Z01419 Encounter for gynecological examination (general) (routine) without abnormal findings: Secondary | ICD-10-CM | POA: Insufficient documentation

## 2011-04-02 NOTE — Assessment & Plan Note (Signed)
Done, does have hx of abnormal pap

## 2011-04-02 NOTE — Assessment & Plan Note (Addendum)
Worsened, after much discussion, will increase alprazolam to 80 per month.  Also encouraged to try cymbalta both for pain and anxiety

## 2011-04-02 NOTE — Patient Instructions (Signed)
Please try to find a way to afford your cymbalta I will call with pap results.

## 2011-04-02 NOTE — Progress Notes (Signed)
  Subjective:    Patient ID: Kylie [name], female    DOB: [birth year], 40 y.o.   MRN: [id number]  HPI Several issues Primarily hear for her annual exam.  Monogamous.  No gyn complaints Remains clean and sober  Anxiety level still very high.  States agoraphobia - doesn't go out much due to panic Has not filled cymbalta due to cost.  Would still like to try if she can find a way to afford. Back pain is constant.  Tramadol is of little help.  As previously stated, no narcotics for back pain.    Review of Systems     Objective:   Physical Exam Lungs, clear Cardiac RRR Breast benign Abd benign Pelvic WNL, Pap done        Assessment & Plan:

## 2011-04-06 ENCOUNTER — Encounter: Payer: Self-pay | Admitting: Family Medicine

## 2011-04-14 ENCOUNTER — Telehealth: Payer: Self-pay | Admitting: Family Medicine

## 2011-04-14 NOTE — Telephone Encounter (Signed)
Samaiyah need to s peak with you about seeing Brandon's dad.  Need to see asap due to having lump to breast

## 2011-04-14 NOTE — Telephone Encounter (Signed)
Called.  This adult female who I have seen in the remote past has a growing axillary lump present for 8 months.  Absolutely needs to be evaluated for possible cancer.  I will see.

## 2011-05-09 ENCOUNTER — Emergency Department (HOSPITAL_BASED_OUTPATIENT_CLINIC_OR_DEPARTMENT_OTHER)
Admission: EM | Admit: 2011-05-09 | Discharge: 2011-05-09 | Disposition: A | Discharge status: Home / Self Care | Payer: Self-pay | Attending: Emergency Medicine | Admitting: Emergency Medicine

## 2011-05-09 ENCOUNTER — Emergency Department (INDEPENDENT_AMBULATORY_CARE_PROVIDER_SITE_OTHER): Payer: Self-pay

## 2011-05-09 ENCOUNTER — Encounter (HOSPITAL_BASED_OUTPATIENT_CLINIC_OR_DEPARTMENT_OTHER): Payer: Self-pay | Admitting: *Deleted

## 2011-05-09 DIAGNOSIS — M545 Low back pain, unspecified: Secondary | ICD-10-CM

## 2011-05-09 DIAGNOSIS — G8929 Other chronic pain: Secondary | ICD-10-CM | POA: Insufficient documentation

## 2011-05-09 DIAGNOSIS — M25559 Pain in unspecified hip: Secondary | ICD-10-CM | POA: Insufficient documentation

## 2011-05-09 DIAGNOSIS — F17200 Nicotine dependence, unspecified, uncomplicated: Secondary | ICD-10-CM | POA: Insufficient documentation

## 2011-05-09 DIAGNOSIS — IMO0002 Reserved for concepts with insufficient information to code with codable children: Secondary | ICD-10-CM | POA: Insufficient documentation

## 2011-05-09 MED ORDER — OXYCODONE-ACETAMINOPHEN 5-325 MG PO TABS
2.0000 | ORAL_TABLET | Freq: Once | ORAL | Status: AC
  Administered 2011-05-09: 2 via ORAL
  Filled 2011-05-09: qty 2

## 2011-05-09 MED ORDER — DICLOFENAC SODIUM 75 MG PO TBEC: 75.0000 mg | DELAYED_RELEASE_TABLET | Freq: Two times a day (BID) | ORAL | Status: DC

## 2011-05-09 NOTE — ED Provider Notes (Signed)
Medical screening examination/treatment/procedure(s) were performed by non-physician practitioner and as supervising physician I was immediately available for consultation/collaboration.   Forbes Cellar, MD 05/09/11 [phone]
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
## 2011-05-09 NOTE — ED Provider Notes (Signed)
History     CSN: [id number] Arrival date & time: 05/09/2011  7:49 PM   First MD Initiated Contact with Patient 05/09/11 2131      Chief Complaint  Patient presents with  . Hip Pain    (Consider location/radiation/quality/duration/timing/severity/associated sxs/prior treatment) Patient is a 40 y.o. female presenting with hip pain. The history is provided by the patient. No language interpreter was used.  Hip Pain This is a new problem. The current episode started 1 to 4 weeks ago. The problem occurs constantly. The problem has been gradually worsening. Pertinent negatives include no numbness or weakness. The symptoms are aggravated by twisting and walking. She has tried acetaminophen and relaxation for the symptoms. The treatment provided no relief.  Pt reports she took her last percocet today.  Pt reports she has chronic back pain.  Pt reports she is trying to get disability.  Pt is followed by Dr. Leveda Anna at [facility].    Past Medical History  Diagnosis Date  . Hepatitis B infection   . Cervical cancer     Stage 2  . Degenerative disk disease   . Ovarian cyst   . Chronic UTI   . Anxiety     Past Surgical History  Procedure Date  . Tubal ligation   . Mandible fracture surgery   . Wisdom tooth extraction     No family history on file.  History  Substance Use Topics  . Smoking status: Current Everyday Smoker -- 0.5 packs/day for 25 years    Types: Cigarettes  . Smokeless tobacco: Never Used  . Alcohol Use: No    OB History    Grav Para Term Preterm Abortions TAB SAB Ect Mult Living                  Review of Systems  Neurological: Negative for weakness and numbness.  All other systems reviewed and are negative.    Allergies  Citalopram and Povidone-iodine  Home Medications   Current Outpatient Rx  Name Route Sig Dispense Refill  . ALPRAZOLAM 1 MG PO TABS Oral Take 1 tablet (1 mg total) by mouth 3 (three) times daily as needed for sleep.  No early refills 80 tablet 4  . CYCLOBENZAPRINE HCL 10 MG PO TABS Oral Take 20 mg by mouth at bedtime.      . OXYCODONE-ACETAMINOPHEN 10-325 MG PO TABS Oral Take 1 tablet by mouth every 4 (four) hours as needed. For pain     . DULOXETINE HCL 60 MG PO CPEP Oral Take 60 mg by mouth daily.        BP 124/75  Pulse 114  Temp(Src) 98.2 F (36.8 C) (Oral)  Resp 20  Ht 5\' 6"  (1.676 m)  Wt 200 lb (90.719 kg)  BMI 32.28 kg/m2  SpO2 100%  Physical Exam  Nursing note and vitals reviewed. Constitutional: She appears well-developed and well-nourished.  HENT:  Head: Normocephalic.  Neck: Normal range of motion.  Cardiovascular: Normal rate.   Pulmonary/Chest: Effort normal.  Abdominal: Soft.  Musculoskeletal: She exhibits tenderness.       Tender left hip,  Pain with movement.    Neurological: She is alert.  Skin: Skin is warm and dry.  Psychiatric: She has a normal mood and affect.    ED Course  Procedures (including critical care time)  Labs Reviewed - No data to display No results found.   No diagnosis found.    MDM  Hip xrays normal.  Pt advised  she needs to see Dr. Leveda Anna for pain mangement and recheck. I offered pt nonnarcotics pain management.  I did not prescribe percocet.  Pt left ED, ambulating briskly and cussing ED and staff.          Langston Masker, [place] 05/09/11 2243  Langston Masker, [place] 05/09/11 2257

## 2011-06-30 ENCOUNTER — Other Ambulatory Visit: Payer: Self-pay | Admitting: Family Medicine

## 2011-06-30 ENCOUNTER — Encounter: Payer: Self-pay | Admitting: Family Medicine

## 2011-08-12 ENCOUNTER — Ambulatory Visit (INDEPENDENT_AMBULATORY_CARE_PROVIDER_SITE_OTHER): Payer: Self-pay | Admitting: Family Medicine

## 2011-08-12 ENCOUNTER — Encounter: Payer: Self-pay | Admitting: Family Medicine

## 2011-08-12 VITALS — BP 110/80 | HR 88 | Temp 98.2°F | Ht 66.0 in | Wt 220.0 lb

## 2011-08-12 DIAGNOSIS — M25552 Pain in left hip: Secondary | ICD-10-CM

## 2011-08-12 DIAGNOSIS — E669 Obesity, unspecified: Secondary | ICD-10-CM

## 2011-08-12 DIAGNOSIS — F329 Major depressive disorder, single episode, unspecified: Secondary | ICD-10-CM

## 2011-08-12 DIAGNOSIS — F411 Generalized anxiety disorder: Secondary | ICD-10-CM

## 2011-08-12 DIAGNOSIS — M25559 Pain in unspecified hip: Secondary | ICD-10-CM

## 2011-08-12 DIAGNOSIS — M545 Low back pain: Secondary | ICD-10-CM

## 2011-08-12 MED ORDER — ALPRAZOLAM 1 MG PO TABS: 1.0000 mg | ORAL_TABLET | Freq: Three times a day (TID) | ORAL | Status: DC | PRN

## 2011-08-12 MED ORDER — GABAPENTIN 300 MG PO CAPS: 300.0000 mg | ORAL_CAPSULE | Freq: Three times a day (TID) | ORAL | Status: DC

## 2011-08-12 NOTE — Progress Notes (Signed)
  Subjective:    Patient ID: Kylie [name], female    DOB: 10-29-1970, 41 y.o.   MRN: [id number]  HPI Left hip pain.  Some catching.  Also complains of chronic severe back pain.  Asking for narcotics again.  States unable to work.  Cannot stand except for short periods.  Turned down for disability.  She is appealing.      Review of Systems     Objective:   Physical Exam Pain on external rotation of left hip.  Pain in paraspinous muscles of back.  Nl DTRs        Assessment & Plan:

## 2011-08-14 DIAGNOSIS — M25552 Pain in left hip: Secondary | ICD-10-CM | POA: Insufficient documentation

## 2011-08-14 NOTE — Assessment & Plan Note (Signed)
New complaint.  Likely some arthritic componant.  Also worse with weight and with her back pain.  Rec stretching exercises.

## 2011-08-14 NOTE — Assessment & Plan Note (Signed)
Worsening which will make both back and hip pain worse.

## 2011-08-14 NOTE — Assessment & Plan Note (Signed)
Worse pain. Reiterated unwilling to give narcotics.  Did start gabapentin for her chronic pain, which likely has a neuropathic componant.

## 2011-08-14 NOTE — Patient Instructions (Signed)
The neurotin will hopefully help your back and hip Please work on stretching exercises for your hip.

## 2011-08-27 ENCOUNTER — Encounter (HOSPITAL_BASED_OUTPATIENT_CLINIC_OR_DEPARTMENT_OTHER): Payer: Self-pay | Admitting: *Deleted

## 2011-08-27 ENCOUNTER — Emergency Department (HOSPITAL_BASED_OUTPATIENT_CLINIC_OR_DEPARTMENT_OTHER)
Admission: EM | Admit: 2011-08-27 | Discharge: 2011-08-28 | Disposition: A | Discharge status: Home / Self Care | Payer: Self-pay | Attending: Emergency Medicine | Admitting: Emergency Medicine

## 2011-08-27 DIAGNOSIS — S1093XA Contusion of unspecified part of neck, initial encounter: Secondary | ICD-10-CM | POA: Insufficient documentation

## 2011-08-27 DIAGNOSIS — Z8619 Personal history of other infectious and parasitic diseases: Secondary | ICD-10-CM | POA: Insufficient documentation

## 2011-08-27 DIAGNOSIS — S0083XA Contusion of other part of head, initial encounter: Secondary | ICD-10-CM

## 2011-08-27 DIAGNOSIS — F17200 Nicotine dependence, unspecified, uncomplicated: Secondary | ICD-10-CM | POA: Insufficient documentation

## 2011-08-27 DIAGNOSIS — S0003XA Contusion of scalp, initial encounter: Secondary | ICD-10-CM | POA: Insufficient documentation
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
## 2011-08-28 ENCOUNTER — Emergency Department (INDEPENDENT_AMBULATORY_CARE_PROVIDER_SITE_OTHER): Payer: Self-pay

## 2011-08-28 DIAGNOSIS — Z0489 Encounter for examination and observation for other specified reasons: Secondary | ICD-10-CM

## 2011-08-28 MED ORDER — OXYCODONE-ACETAMINOPHEN 5-325 MG PO TABS
2.0000 | ORAL_TABLET | Freq: Once | ORAL | Status: AC
  Administered 2011-08-28: 2 via ORAL
  Filled 2011-08-28: qty 2

## 2011-08-28 MED ORDER — NAPROXEN 500 MG PO TABS: 500.0000 mg | ORAL_TABLET | Freq: Two times a day (BID) | ORAL | Status: DC

## 2011-08-28 NOTE — ED Notes (Signed)
Bobbie RN in charge knows Pt. History and will be with Pt. When RN Earlene Plater signs off on pt.
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
## 2011-09-08 ENCOUNTER — Telehealth: Payer: Self-pay | Admitting: Family Medicine

## 2011-09-08 DIAGNOSIS — M25552 Pain in left hip: Secondary | ICD-10-CM

## 2011-09-08 NOTE — Telephone Encounter (Signed)
Kylie [name] is calling to say she's having extreme pain to her left hip.  Need to speak to you asap about referral to orthopedic.  Having a lot of difficulty walking.

## 2011-09-09 NOTE — Assessment & Plan Note (Signed)
See documentation.  Will get sports med referral

## 2011-09-09 NOTE — Telephone Encounter (Signed)
Spoke with [place].  She complains of severe Left hip pain - so bad that she has trouble walking.  Reviewed normal left hip X-rays done 12/12.  She states that she is not seeking pain medications, just a diagnosis.  Offered to see her or to refer her to our sports med clinic.  She desires sports med referral.  Order entered.

## 2011-09-15 ENCOUNTER — Telehealth: Payer: Self-pay | Admitting: *Deleted

## 2011-09-15 NOTE — Telephone Encounter (Signed)
Received a fax today from [facility] stating that patient had no showed her appointment on 09/07/2011. The fax states: "The patient broke their appointment. No additional appointments will be given"

## 2011-09-21 ENCOUNTER — Encounter: Payer: Self-pay | Admitting: Family Medicine

## 2011-09-21 ENCOUNTER — Ambulatory Visit (INDEPENDENT_AMBULATORY_CARE_PROVIDER_SITE_OTHER): Payer: Self-pay | Admitting: Family Medicine

## 2011-09-21 VITALS — BP 104/71 | HR 90

## 2011-09-21 DIAGNOSIS — M25559 Pain in unspecified hip: Secondary | ICD-10-CM

## 2011-09-21 DIAGNOSIS — M25552 Pain in left hip: Secondary | ICD-10-CM
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
## 2011-09-21 NOTE — Patient Instructions (Addendum)
You have been scheduled for an appointment for a MRI at [facility] in [facility] on Saturday 09/25/11 at 1pm.

## 2011-09-24 ENCOUNTER — Other Ambulatory Visit (HOSPITAL_COMMUNITY): Payer: Self-pay

## 2011-09-26 ENCOUNTER — Other Ambulatory Visit (HOSPITAL_BASED_OUTPATIENT_CLINIC_OR_DEPARTMENT_OTHER): Payer: Self-pay

## 2011-10-03 ENCOUNTER — Ambulatory Visit (HOSPITAL_BASED_OUTPATIENT_CLINIC_OR_DEPARTMENT_OTHER)
Admission: RE | Admit: 2011-10-03 | Discharge: 2011-10-03 | Disposition: A | Discharge status: Home / Self Care | Payer: Self-pay | Source: Ambulatory Visit | Attending: Family Medicine | Admitting: Family Medicine

## 2011-10-03 DIAGNOSIS — M25559 Pain in unspecified hip: Secondary | ICD-10-CM

## 2011-10-03 DIAGNOSIS — M25519 Pain in unspecified shoulder: Secondary | ICD-10-CM | POA: Insufficient documentation

## 2011-10-03 DIAGNOSIS — M25552 Pain in left hip: Secondary | ICD-10-CM

## 2011-10-03 DIAGNOSIS — N72 Inflammatory disease of cervix uteri: Secondary | ICD-10-CM | POA: Insufficient documentation

## 2011-10-06 ENCOUNTER — Encounter: Payer: Self-pay | Admitting: Family Medicine

## 2011-10-15 ENCOUNTER — Telehealth: Payer: Self-pay | Admitting: Family Medicine

## 2011-10-15 NOTE — Telephone Encounter (Signed)
Patient wants to speak to either Dr. Leveda Anna or Jennette Kettle about the results of her MRI.

## 2011-10-16 ENCOUNTER — Encounter: Payer: Self-pay | Admitting: Family Medicine

## 2011-10-20 ENCOUNTER — Telehealth: Payer: Self-pay | Admitting: Family Medicine

## 2011-10-20 NOTE — Telephone Encounter (Signed)
Kylie [name] need you to call her to give better interpretation of MRI results and to advise what she can do about it to help alleviate the pain.  Please contact her asap.  Also need to found out about the cyst that was shown as well.  Call her at [phone].

## 2011-10-27 NOTE — Telephone Encounter (Signed)
Spoke with pt- gave her MRI results per Dr. Jennette Kettle.  Advised her to f/u with Dr. Leveda Anna.

## 2011-10-27 NOTE — Telephone Encounter (Signed)
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 The best treatment for that is acetaminophen---2 ES Tylenol 3 times a day. The cyst on her ovary appears benign and is also common and nomral in pre meniopausal women. Please let her know I have reviewed her results with her PCp Dr Leveda Anna and she should follow up with him. THANKS! Denny Levy

## 2011-10-27 NOTE — Telephone Encounter (Signed)
Spoke with pt- advised her of results per Dr. Jennette Kettle- advised her to f/u with Dr. Leveda Anna.

## 2012-01-04 ENCOUNTER — Other Ambulatory Visit: Payer: Self-pay | Admitting: Family Medicine

## 2012-01-04 DIAGNOSIS — F411 Generalized anxiety disorder: Secondary | ICD-10-CM

## 2012-01-04 MED ORDER — ALPRAZOLAM 1 MG PO TABS: 1.0000 mg | ORAL_TABLET | Freq: Three times a day (TID) | ORAL | Status: DC

## 2012-01-13 ENCOUNTER — Emergency Department (HOSPITAL_BASED_OUTPATIENT_CLINIC_OR_DEPARTMENT_OTHER)
Admission: EM | Admit: 2012-01-13 | Discharge: 2012-01-13 | Disposition: A | Discharge status: Home / Self Care | Payer: Self-pay | Attending: Emergency Medicine | Admitting: Emergency Medicine

## 2012-01-13 ENCOUNTER — Emergency Department (HOSPITAL_BASED_OUTPATIENT_CLINIC_OR_DEPARTMENT_OTHER): Payer: Self-pay

## 2012-01-13 ENCOUNTER — Encounter (HOSPITAL_BASED_OUTPATIENT_CLINIC_OR_DEPARTMENT_OTHER): Payer: Self-pay | Admitting: Emergency Medicine

## 2012-01-13 DIAGNOSIS — S93402A Sprain of unspecified ligament of left ankle, initial encounter: Secondary | ICD-10-CM

## 2012-01-13 DIAGNOSIS — S93409A Sprain of unspecified ligament of unspecified ankle, initial encounter: Secondary | ICD-10-CM | POA: Insufficient documentation

## 2012-01-13 DIAGNOSIS — W19XXXA Unspecified fall, initial encounter: Secondary | ICD-10-CM | POA: Insufficient documentation

## 2012-01-13 MED ORDER — HYDROCODONE-ACETAMINOPHEN 5-325 MG PO TABS: 2.0000 | ORAL_TABLET | Freq: Once | ORAL | Status: DC

## 2012-01-13 MED ORDER — ONDANSETRON 4 MG PO TBDP
ORAL_TABLET | ORAL | Status: AC
  Administered 2012-01-13: 4 mg
  Filled 2012-01-13: qty 1

## 2012-01-13 MED ORDER — PROMETHAZINE HCL 25 MG PO TABS
25.0000 mg | ORAL_TABLET | Freq: Once | ORAL | Status: AC
  Administered 2012-01-13: 25 mg via ORAL
  Filled 2012-01-13: qty 1

## 2012-01-13 MED ORDER — HYDROMORPHONE HCL PF 1 MG/ML IJ SOLN
1.0000 mg | Freq: Once | INTRAMUSCULAR | Status: AC
  Administered 2012-01-13: 1 mg via INTRAMUSCULAR
  Filled 2012-01-13: qty 1

## 2012-01-13 MED ORDER — HYDROCODONE-ACETAMINOPHEN 5-325 MG PO TABS
ORAL_TABLET | ORAL | Status: AC
  Administered 2012-01-13: 2
  Filled 2012-01-13: qty 2

## 2012-01-13 MED ORDER — HYDROCODONE-ACETAMINOPHEN 7.5-325 MG PO TABS: 1.0000 | ORAL_TABLET | ORAL | Status: AC | PRN
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
## 2012-01-18 ENCOUNTER — Encounter (HOSPITAL_BASED_OUTPATIENT_CLINIC_OR_DEPARTMENT_OTHER): Payer: Self-pay | Admitting: Family Medicine

## 2012-01-18 ENCOUNTER — Emergency Department (HOSPITAL_BASED_OUTPATIENT_CLINIC_OR_DEPARTMENT_OTHER)
Admission: EM | Admit: 2012-01-18 | Discharge: 2012-01-18 | Disposition: A | Discharge status: Home / Self Care | Payer: Self-pay | Attending: Emergency Medicine | Admitting: Emergency Medicine

## 2012-01-18 DIAGNOSIS — Z09 Encounter for follow-up examination after completed treatment for conditions other than malignant neoplasm: Secondary | ICD-10-CM | POA: Insufficient documentation

## 2012-01-18 DIAGNOSIS — IMO0002 Reserved for concepts with insufficient information to code with codable children: Secondary | ICD-10-CM | POA: Insufficient documentation

## 2012-01-18 DIAGNOSIS — C539 Malignant neoplasm of cervix uteri, unspecified: Secondary | ICD-10-CM | POA: Insufficient documentation

## 2012-01-18 DIAGNOSIS — S93409A Sprain of unspecified ligament of unspecified ankle, initial encounter: Secondary | ICD-10-CM | POA: Insufficient documentation

## 2012-01-18 DIAGNOSIS — Z888 Allergy status to other drugs, medicaments and biological substances status: Secondary | ICD-10-CM | POA: Insufficient documentation

## 2012-01-18 DIAGNOSIS — F411 Generalized anxiety disorder: Secondary | ICD-10-CM | POA: Insufficient documentation

## 2012-01-18 DIAGNOSIS — B1910 Unspecified viral hepatitis B without hepatic coma: Secondary | ICD-10-CM | POA: Insufficient documentation

## 2012-01-18 DIAGNOSIS — F17200 Nicotine dependence, unspecified, uncomplicated: Secondary | ICD-10-CM | POA: Insufficient documentation

## 2012-01-18 DIAGNOSIS — X58XXXA Exposure to other specified factors, initial encounter: Secondary | ICD-10-CM | POA: Insufficient documentation

## 2012-01-18 NOTE — ED Provider Notes (Signed)
History     CSN: [id number]  Arrival date & time 01/18/12  1022   First MD Initiated Contact with Patient 01/18/12 1037      Chief Complaint  Patient presents with  . Follow-up    (Consider location/radiation/quality/duration/timing/severity/associated sxs/prior treatment) HPI Twisted ankle five days ago. Seen here with negative x-Nishika Parkhurst.  Splint placed and patient on crutches.  Patient told to follow up with ortho but states she was told to return here if continued pain and unable to follow up with ortho.  Swelling continues but improved.  Pain 8/10, patient using ibuprofen for pain.  Past Medical History  Diagnosis Date  . Hepatitis B infection   . Cervical cancer     Stage 2  . Degenerative disk disease   . Ovarian cyst   . Chronic UTI   . Anxiety     Past Surgical History  Procedure Date  . Tubal ligation   . Mandible fracture surgery   . Wisdom tooth extraction     No family history on file.  History  Substance Use Topics  . Smoking status: Current Everyday Smoker -- 0.5 packs/day for 25 years    Types: Cigarettes  . Smokeless tobacco: Never Used  . Alcohol Use: No    OB History    Grav Para Term Preterm Abortions TAB SAB Ect Mult Living                  Review of Systems  All other systems reviewed and are negative.    Allergies  Citalopram and Povidone  Home Medications   Current Outpatient Rx  Name Route Sig Dispense Refill  . ALPRAZOLAM 1 MG PO TABS Oral Take 1 tablet (1 mg total) by mouth 3 (three) times daily. No early refills 90 tablet 5  . HYDROCODONE-ACETAMINOPHEN 7.5-325 MG PO TABS Oral Take 1 tablet by mouth every 4 (four) hours as needed for pain. 20 tablet 0    BP 125/61  Pulse 84  Temp 98.1 F (36.7 C) (Oral)  Resp 16  SpO2 98%  LMP 12/13/2011  Physical Exam  Nursing note and vitals reviewed. Constitutional: She is oriented to person, place, and time. She appears well-developed.  Musculoskeletal:       lle with lateral ssw  and tenderness to palpation.  dp and sensation intact. Toes pink with good cap refill, knee nontender.  No ligament laxity noted.   Neurological: She is alert and oriented to person, place, and time.  Skin: Skin is warm and dry.  Psychiatric: She has a normal mood and affect.    ED Course  Procedures (including critical care time)  Labs Reviewed - No data to display No results found.   No diagnosis found.    MDM  Will d/c splint.  Continue weight bearing as tolerated.  F/u ortho.        Hilario Quarry, MD 01/18/12 1045

## 2012-01-18 NOTE — ED Notes (Addendum)
Pt sts she is here for f/u for left ankle injury. Pt had posterior splint intact upon arrival and using crutches. Pt sts swelling has improved but pain remains 8/10. Pt sts she is out of pain medication. Swelling and bruising noted to left lateral ankle, cms intact.

## 2012-01-29 NOTE — ED Provider Notes (Signed)
History     CSN: [id number]  Arrival date & time 01/13/12  1904   First MD Initiated Contact with Patient 01/13/12 2120      Chief Complaint  Patient presents with  . Fall  . Ankle Pain    (Consider location/radiation/quality/duration/timing/severity/associated sxs/prior treatment) Patient is a 41 y.o. female presenting with ankle pain. The history is provided by the patient.  Ankle Pain  The incident occurred 1 to 2 hours ago. The incident occurred in the street. The injury mechanism was a fall. The pain is present in the left ankle. The quality of the pain is described as aching. The pain is moderate. The pain has been constant since onset. Pertinent negatives include no inability to bear weight and no loss of sensation. She reports no foreign bodies present. The symptoms are aggravated by bearing weight. She has tried ice for the symptoms. The treatment provided no relief.    Past Medical History  Diagnosis Date  . Hepatitis B infection   . Cervical cancer     Stage 2  . Degenerative disk disease   . Ovarian cyst   . Chronic UTI   . Anxiety     Past Surgical History  Procedure Date  . Tubal ligation   . Mandible fracture surgery   . Wisdom tooth extraction     No family history on file.  History  Substance Use Topics  . Smoking status: Current Everyday Smoker -- 0.5 packs/day for 25 years    Types: Cigarettes  . Smokeless tobacco: Never Used  . Alcohol Use: No    OB History    Grav Para Term Preterm Abortions TAB SAB Ect Mult Living                  Review of Systems  Constitutional: Negative for activity change.       All ROS Neg except as noted in HPI  HENT: Negative for nosebleeds and neck pain.   Eyes: Negative for photophobia and discharge.  Respiratory: Negative for cough, shortness of breath and wheezing.   Cardiovascular: Negative for chest pain and palpitations.  Gastrointestinal: Negative for abdominal pain and blood in stool.    Genitourinary: Negative for dysuria, frequency and hematuria.  Musculoskeletal: Positive for back pain. Negative for arthralgias.  Skin: Negative.   Neurological: Negative for dizziness, seizures and speech difficulty.  Psychiatric/Behavioral: Negative for hallucinations and confusion. The patient is nervous/anxious.     Allergies  Citalopram and Povidone  Home Medications   Current Outpatient Rx  Name Route Sig Dispense Refill  . ALPRAZOLAM 1 MG PO TABS Oral Take 1 tablet (1 mg total) by mouth 3 (three) times daily. No early refills 90 tablet 5    BP 133/68  Pulse 94  Temp 98.3 F (36.8 C) (Oral)  Resp 20  Ht 5\' 6"  (1.676 m)  Wt 185 lb (83.915 kg)  BMI 29.86 kg/m2  SpO2 99%  LMP 12/13/2011  Physical Exam  Nursing note and vitals reviewed. Constitutional: She is oriented to person, place, and time. She appears well-developed and well-nourished.  Non-toxic appearance.  HENT:  Head: Normocephalic.  Right Ear: Tympanic membrane and external ear normal.  Left Ear: Tympanic membrane and external ear normal.  Eyes: EOM and lids are normal. Pupils are equal, round, and reactive to light.  Neck: Normal range of motion. Neck supple. Carotid bruit is not present.  Cardiovascular: Normal rate, regular rhythm, normal heart sounds, intact distal pulses and normal pulses.  Pulmonary/Chest: Breath sounds normal. No respiratory distress.  Abdominal: Soft. Bowel sounds are normal. There is no tenderness. There is no guarding.  Musculoskeletal: Normal range of motion.       There is swelling of the left ankle with bruising at the base of the ankle. Toes are moderately swollen. DP pulse is 2+. Good cap refill and sensory. Achilles intact.  Lymphadenopathy:       Head (right side): No submandibular adenopathy present.       Head (left side): No submandibular adenopathy present.    She has no cervical adenopathy.  Neurological: She is alert and oriented to person, place, and time. She has  normal strength. No cranial nerve deficit or sensory deficit.  Skin: Skin is warm and dry.  Psychiatric: She has a normal mood and affect. Her speech is normal.    ED Course  Procedures (including critical care time)  Labs Reviewed - No data to display No results found.   1. Left ankle sprain       MDM  I have reviewed nursing notes, vital signs, and all appropriate lab and imaging results for this patient. Pt fell and injured the left foot and ankle. Xrays are negative. Pt placed in splint and crutches. ICE applied. Pt advised to set up appointment with orthopedics. She is to return to the Emergency Dept in 2 days for reheck.       Kathie Dike, PA 01/29/12 [address] [place], [place] 02/03/12 1625  Kathie Dike, PA 02/12/12 1835  Kathie Dike, [place] 02/18/12 [phone]

## 2012-02-24 NOTE — ED Provider Notes (Signed)
Medical screening examination/treatment/procedure(s) were performed by non-physician practitioner and as supervising physician I was immediately available for consultation/collaboration.   Shelda Jakes, MD 02/24/12 [phone]

## 2012-03-11 ENCOUNTER — Ambulatory Visit: Payer: Self-pay | Admitting: Family Medicine

## 2012-04-07 ENCOUNTER — Ambulatory Visit (INDEPENDENT_AMBULATORY_CARE_PROVIDER_SITE_OTHER): Payer: Self-pay | Admitting: Family Medicine

## 2012-04-07 ENCOUNTER — Encounter: Payer: Self-pay | Admitting: Family Medicine

## 2012-04-07 VITALS — BP 122/74 | HR 86 | Ht 66.0 in | Wt 185.0 lb

## 2012-04-07 DIAGNOSIS — F411 Generalized anxiety disorder: Secondary | ICD-10-CM

## 2012-04-07 DIAGNOSIS — F329 Major depressive disorder, single episode, unspecified: Secondary | ICD-10-CM

## 2012-04-07 DIAGNOSIS — M545 Low back pain: Secondary | ICD-10-CM

## 2012-04-07 DIAGNOSIS — Z23 Encounter for immunization: Secondary | ICD-10-CM

## 2012-04-11 NOTE — Assessment & Plan Note (Signed)
Seems worse today.  I suspect there is an element of financial desperation as finances and life become more constricted by her mental illness.  Unfortunately, she is unwilling/unable to take the steps needed to improve her situation.  ("Antidepressants don't work on me.) Unwilling to commit to counseling.
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
## 2012-04-11 NOTE — Assessment & Plan Note (Signed)
Chronic and stable.   

## 2012-04-18 ENCOUNTER — Encounter (HOSPITAL_BASED_OUTPATIENT_CLINIC_OR_DEPARTMENT_OTHER): Payer: Self-pay | Admitting: Emergency Medicine

## 2012-04-18 ENCOUNTER — Emergency Department (HOSPITAL_BASED_OUTPATIENT_CLINIC_OR_DEPARTMENT_OTHER)
Admission: EM | Admit: 2012-04-18 | Discharge: 2012-04-18 | Disposition: A | Discharge status: Home / Self Care | Payer: Self-pay | Attending: Emergency Medicine | Admitting: Emergency Medicine

## 2012-04-18 DIAGNOSIS — Z8619 Personal history of other infectious and parasitic diseases: Secondary | ICD-10-CM | POA: Insufficient documentation

## 2012-04-18 DIAGNOSIS — Z87448 Personal history of other diseases of urinary system: Secondary | ICD-10-CM | POA: Insufficient documentation

## 2012-04-18 DIAGNOSIS — Z79899 Other long term (current) drug therapy: Secondary | ICD-10-CM | POA: Insufficient documentation

## 2012-04-18 DIAGNOSIS — Y9239 Other specified sports and athletic area as the place of occurrence of the external cause: Secondary | ICD-10-CM | POA: Insufficient documentation

## 2012-04-18 DIAGNOSIS — Z8742 Personal history of other diseases of the female genital tract: Secondary | ICD-10-CM | POA: Insufficient documentation

## 2012-04-18 DIAGNOSIS — S01112A Laceration without foreign body of left eyelid and periocular area, initial encounter: Secondary | ICD-10-CM

## 2012-04-18 DIAGNOSIS — F411 Generalized anxiety disorder: Secondary | ICD-10-CM | POA: Insufficient documentation

## 2012-04-18 DIAGNOSIS — F17200 Nicotine dependence, unspecified, uncomplicated: Secondary | ICD-10-CM | POA: Insufficient documentation

## 2012-04-18 DIAGNOSIS — W2209XA Striking against other stationary object, initial encounter: Secondary | ICD-10-CM | POA: Insufficient documentation

## 2012-04-18 DIAGNOSIS — Z8541 Personal history of malignant neoplasm of cervix uteri: Secondary | ICD-10-CM | POA: Insufficient documentation

## 2012-04-18 DIAGNOSIS — S058X9A Other injuries of unspecified eye and orbit, initial encounter: Secondary | ICD-10-CM | POA: Insufficient documentation

## 2012-04-18 DIAGNOSIS — Y9389 Activity, other specified: Secondary | ICD-10-CM | POA: Insufficient documentation

## 2012-04-18 MED ORDER — LIDOCAINE-EPINEPHRINE-TETRACAINE (LET) SOLUTION: 3.0000 mL | Freq: Once | NASAL | Status: DC

## 2012-04-18 MED ORDER — HYDROCODONE-ACETAMINOPHEN 5-325 MG PO TABS
2.0000 | ORAL_TABLET | Freq: Once | ORAL | Status: AC
  Administered 2012-04-18: 2 via ORAL
  Filled 2012-04-18: qty 2

## 2012-04-18 MED ORDER — HYDROCODONE-ACETAMINOPHEN 5-325 MG PO TABS: 2.0000 | ORAL_TABLET | ORAL | Status: AC | PRN
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
## 2012-04-18 NOTE — ED Provider Notes (Signed)
Medical screening examination/treatment/procedure(s) were performed by non-physician practitioner and as supervising physician I was immediately available for consultation/collaboration.  Jasmine Awe, MD 04/18/12 [phone]

## 2012-04-18 NOTE — ED Notes (Signed)
Pt has laceration to left eye brow from a pool ball.

## 2012-05-06 ENCOUNTER — Ambulatory Visit: Payer: Self-pay | Admitting: Family Medicine

## 2012-06-22 ENCOUNTER — Other Ambulatory Visit: Payer: Self-pay | Admitting: *Deleted

## 2012-06-22 DIAGNOSIS — F411 Generalized anxiety disorder: Secondary | ICD-10-CM

## 2012-06-22 NOTE — Telephone Encounter (Signed)
Returned call to pharmacy and informed that patient cannot have early refill per Dr. Cyndia Skeeters notes.  Earliest pharmacy can refill med is Friday (06/24/12).  Patient does not have any refills.  Will check with Dr. Jennette Kettle for authorization to refill.  Gaylene Brooks, RN

## 2012-06-22 NOTE — Telephone Encounter (Signed)
Kylie [name] at [facility].  Patient calling repeatedly to request refill of Alprazolam.  Requesting refill 3 days early--last filled on 05/27/13.  Dr. Leveda Anna not in office this week.  Refill  Request was faxed to our office and is in Dr. Donnetta Hail box.  Will route request to Dr. Jennette Kettle and call pharmacy back.  Gaylene Brooks, RN

## 2012-06-22 NOTE — Telephone Encounter (Signed)
Kylie [name] On her med list it says : NO EARLY REFILLS". THANKS! Denny Levy

## 2012-06-24 MED ORDER — ALPRAZOLAM 1 MG PO TABS: 1.0000 mg | ORAL_TABLET | Freq: Three times a day (TID) | ORAL | Status: DC

## 2012-06-24 NOTE — Telephone Encounter (Signed)
Spoke with pharmacy and gave below rx information

## 2012-06-24 NOTE — Telephone Encounter (Signed)
Dear Kylie [name] she can have a (one) refill as of today 06/24/2012. THANKS! Denny Levy

## 2012-07-15 ENCOUNTER — Ambulatory Visit: Payer: Self-pay | Admitting: Family Medicine

## 2012-07-22 ENCOUNTER — Ambulatory Visit (INDEPENDENT_AMBULATORY_CARE_PROVIDER_SITE_OTHER): Payer: Self-pay | Admitting: Family Medicine

## 2012-07-22 ENCOUNTER — Encounter: Payer: Self-pay | Admitting: Family Medicine

## 2012-07-22 VITALS — BP 116/72 | HR 85 | Temp 97.7°F | Wt 190.7 lb

## 2012-07-22 DIAGNOSIS — F3289 Other specified depressive episodes: Secondary | ICD-10-CM

## 2012-07-22 DIAGNOSIS — F411 Generalized anxiety disorder: Secondary | ICD-10-CM

## 2012-07-22 DIAGNOSIS — F329 Major depressive disorder, single episode, unspecified: Secondary | ICD-10-CM

## 2012-07-22 MED ORDER — ALPRAZOLAM 1 MG PO TABS: 1.0000 mg | ORAL_TABLET | Freq: Three times a day (TID) | ORAL | Status: DC

## 2012-07-22 NOTE — Patient Instructions (Signed)
I will have your paperwork by the middle of next week. Watch your weight, it is creeping up.   See me in the next 2 months for a pap smear

## 2012-07-22 NOTE — Assessment & Plan Note (Signed)
Refill meds

## 2012-07-22 NOTE — Progress Notes (Signed)
  Subjective:    Patient ID: Kylie [name], female    DOB: [birth year], 42 y.o.   MRN: [id number]  HPI In for refill of meds. Needs disability paperwork completed.  Main reason is psych/anxiety/agoraphobia and depression/bipolar. Minor reason is chronic back pain. Wants to consider bipolar med i.e. Mood stabilizer has not responded well to straight antidepressants in the past.  Mom had bipolar and did well on lithium.  No SI or HI.  States clean and sober    Review of Systems     Objective:   Physical Exam Affect labile.  Teary at one point.  No cognitive impairment.        Assessment & Plan:

## 2012-07-22 NOTE — Assessment & Plan Note (Signed)
FU one month.  Consider lithium

## 2012-07-27 ENCOUNTER — Telehealth: Payer: Self-pay | Admitting: Family Medicine

## 2012-07-27 NOTE — Telephone Encounter (Signed)
Patient is asking to speak to Dr. Leveda Anna about a doctors note for yesterday and today for her boyfriend, Claudie Fisherman.  Her boyfriend isn't a patient here, but she says that Dr. Leveda Anna has written a note for him before and she doesn't want her boyfriend to lose his job.

## 2012-07-28 NOTE — Telephone Encounter (Signed)
Called and LM that I cannot provide such a note for someone not in this practice.

## 2012-08-12 ENCOUNTER — Ambulatory Visit (INDEPENDENT_AMBULATORY_CARE_PROVIDER_SITE_OTHER): Payer: Self-pay | Admitting: Family Medicine

## 2012-08-12 DIAGNOSIS — Z23 Encounter for immunization: Secondary | ICD-10-CM

## 2012-08-15 NOTE — Progress Notes (Signed)
Patient ID: Kylie [name], female   DOB: Feb 23, 1971, 42 y.o.   MRN: [id number] Encounter created in error

## 2012-08-26 ENCOUNTER — Encounter (HOSPITAL_BASED_OUTPATIENT_CLINIC_OR_DEPARTMENT_OTHER): Payer: Self-pay | Admitting: Emergency Medicine

## 2012-08-26 ENCOUNTER — Emergency Department (HOSPITAL_BASED_OUTPATIENT_CLINIC_OR_DEPARTMENT_OTHER): Payer: Medicaid Other

## 2012-08-26 ENCOUNTER — Emergency Department (HOSPITAL_BASED_OUTPATIENT_CLINIC_OR_DEPARTMENT_OTHER)
Admission: EM | Admit: 2012-08-26 | Discharge: 2012-08-26 | Disposition: A | Discharge status: Home / Self Care | Payer: Medicaid Other | Attending: Emergency Medicine | Admitting: Emergency Medicine

## 2012-08-26 DIAGNOSIS — Z87440 Personal history of urinary (tract) infections: Secondary | ICD-10-CM | POA: Insufficient documentation

## 2012-08-26 DIAGNOSIS — Z8541 Personal history of malignant neoplasm of cervix uteri: Secondary | ICD-10-CM | POA: Insufficient documentation

## 2012-08-26 DIAGNOSIS — J449 Chronic obstructive pulmonary disease, unspecified: Secondary | ICD-10-CM | POA: Insufficient documentation

## 2012-08-26 DIAGNOSIS — J4489 Other specified chronic obstructive pulmonary disease: Secondary | ICD-10-CM | POA: Insufficient documentation

## 2012-08-26 DIAGNOSIS — Z79899 Other long term (current) drug therapy: Secondary | ICD-10-CM | POA: Insufficient documentation

## 2012-08-26 DIAGNOSIS — J189 Pneumonia, unspecified organism: Secondary | ICD-10-CM

## 2012-08-26 DIAGNOSIS — R079 Chest pain, unspecified: Secondary | ICD-10-CM | POA: Insufficient documentation

## 2012-08-26 DIAGNOSIS — F17200 Nicotine dependence, unspecified, uncomplicated: Secondary | ICD-10-CM | POA: Insufficient documentation

## 2012-08-26 DIAGNOSIS — Z8742 Personal history of other diseases of the female genital tract: Secondary | ICD-10-CM | POA: Insufficient documentation

## 2012-08-26 DIAGNOSIS — Z8739 Personal history of other diseases of the musculoskeletal system and connective tissue: Secondary | ICD-10-CM | POA: Insufficient documentation

## 2012-08-26 DIAGNOSIS — F411 Generalized anxiety disorder: Secondary | ICD-10-CM | POA: Insufficient documentation

## 2012-08-26 DIAGNOSIS — Z8619 Personal history of other infectious and parasitic diseases: Secondary | ICD-10-CM | POA: Insufficient documentation

## 2012-08-26 LAB — COMPREHENSIVE METABOLIC PANEL
ALT: 80 U/L — ABNORMAL HIGH (ref 0–35)
AST: 29 U/L (ref 0–37)
Albumin: 3.3 g/dL — ABNORMAL LOW (ref 3.5–5.2)
Alkaline Phosphatase: 59 U/L (ref 39–117)
Calcium: 9.3 mg/dL (ref 8.4–10.5)
GFR calc Af Amer: >90 mL/min (ref >90)
Glucose, Bld: 126 mg/dL — ABNORMAL HIGH (ref 70–99)
Potassium: 3.5 mEq/L (ref 3.5–5.1)
Sodium: 137 mEq/L (ref 135–145)
Total Protein: 7.3 g/dL (ref 6.0–8.3)

## 2012-08-26 LAB — CBC WITH DIFFERENTIAL/PLATELET
Basophils Absolute: 0 10*3/uL (ref 0.0–0.1)
Eosinophils Absolute: 0.2 10*3/uL (ref 0.0–0.7)
Lymphocytes Relative: 26 % (ref 12–46)
Lymphs Abs: 1.8 10*3/uL (ref 0.7–4.0)
MCH: 31.4 pg (ref 26.0–34.0)
Neutrophils Relative %: 65 % (ref 43–77)
Platelets: 172 10*3/uL (ref 150–400)
RBC: 3.89 MIL/uL (ref 3.87–5.11)
RDW: 13.7 % (ref 11.5–15.5)
WBC: 6.9 10*3/uL (ref 4.0–10.5)

## 2012-08-26 LAB — POCT I-STAT 3, ART BLOOD GAS (G3+)
Acid-base deficit: 4 mmol/L — ABNORMAL HIGH (ref 0.0–2.0)
Bicarbonate: 23 mEq/L (ref 20.0–24.0)
O2 Saturation: 94 %
TCO2: 24 mmol/L (ref 0–100)
pCO2 arterial: 47.3 mmHg — ABNORMAL HIGH (ref 35.0–45.0)
pO2, Arterial: 80 mmHg (ref 80.0–100.0)

## 2012-08-26 MED ORDER — ONDANSETRON HCL 4 MG/2ML IJ SOLN
4.0000 mg | Freq: Once | INTRAMUSCULAR | Status: AC
  Administered 2012-08-26: 4 mg via INTRAVENOUS
  Filled 2012-08-26: qty 2

## 2012-08-26 MED ORDER — ALBUTEROL SULFATE (5 MG/ML) 0.5% IN NEBU
INHALATION_SOLUTION | RESPIRATORY_TRACT | Status: AC
  Filled 2012-08-26: qty 0.5

## 2012-08-26 MED ORDER — ALBUTEROL SULFATE (5 MG/ML) 0.5% IN NEBU
5.0000 mg | INHALATION_SOLUTION | Freq: Once | RESPIRATORY_TRACT | Status: AC
  Administered 2012-08-26: 5 mg via RESPIRATORY_TRACT
  Filled 2012-08-26: qty 0.5

## 2012-08-26 MED ORDER — HYDROCODONE-ACETAMINOPHEN 5-325 MG PO TABS: 2.0000 | ORAL_TABLET | ORAL | Status: DC | PRN

## 2012-08-26 MED ORDER — ALBUTEROL SULFATE HFA 108 (90 BASE) MCG/ACT IN AERS: 1.0000 | INHALATION_SPRAY | Freq: Four times a day (QID) | RESPIRATORY_TRACT | Status: DC | PRN

## 2012-08-26 MED ORDER — DIPHENHYDRAMINE HCL 50 MG/ML IJ SOLN
12.5000 mg | Freq: Once | INTRAMUSCULAR | Status: AC
  Administered 2012-08-26: 12.5 mg via INTRAVENOUS
  Filled 2012-08-26: qty 1

## 2012-08-26 MED ORDER — SODIUM CHLORIDE 0.9 % IV SOLN
Freq: Once | INTRAVENOUS | Status: AC
  Administered 2012-08-26: 13:00:00 via INTRAVENOUS

## 2012-08-26 MED ORDER — METOCLOPRAMIDE HCL 5 MG/ML IJ SOLN
10.0000 mg | Freq: Once | INTRAMUSCULAR | Status: AC
  Administered 2012-08-26: 10 mg via INTRAVENOUS
  Filled 2012-08-26: qty 2

## 2012-08-26 MED ORDER — AZITHROMYCIN 250 MG PO TABS: ORAL_TABLET | ORAL | Status: DC

## 2012-08-26 MED ORDER — KETOROLAC TROMETHAMINE 30 MG/ML IJ SOLN
30.0000 mg | Freq: Once | INTRAMUSCULAR | Status: AC
  Administered 2012-08-26: 30 mg via INTRAVENOUS
  Filled 2012-08-26: qty 1

## 2012-08-26 MED ORDER — HYDROMORPHONE HCL PF 1 MG/ML IJ SOLN
1.0000 mg | Freq: Once | INTRAMUSCULAR | Status: AC
  Administered 2012-08-26: 1 mg via INTRAVENOUS
  Filled 2012-08-26: qty 1

## 2012-08-26 MED ORDER — DEXTROSE 5 % IV SOLN
1.0000 g | INTRAVENOUS | Status: DC
  Administered 2012-08-26: 1 g via INTRAVENOUS
  Filled 2012-08-26: qty 10

## 2012-08-26 MED ORDER — AZITHROMYCIN 250 MG PO TABS
500.0000 mg | ORAL_TABLET | Freq: Once | ORAL | Status: AC
  Administered 2012-08-26: 500 mg via ORAL
  Filled 2012-08-26: qty 2

## 2012-08-26 NOTE — ED Notes (Signed)
Patient transported to X-ray 

## 2012-08-26 NOTE — ED Notes (Addendum)
Cough and central CP without radiation x2-3 days.  Dizziness, lightheadedness, weakness, vomiting.  Also c/o "migraine" - has never been diagnosed with migraines.

## 2012-08-26 NOTE — ED Notes (Signed)
Pt repeatedly calling out for pain med for her HA.  Pt aware that she must wait for the MD to receive pain med.

## 2012-08-26 NOTE — ED Provider Notes (Signed)
History     CSN: [id number]  Arrival date & time 08/26/12  1138   First MD Initiated Contact with Patient 08/26/12 1256      Chief Complaint  Patient presents with  . Cough  . Chest Pain    (Consider location/radiation/quality/duration/timing/severity/associated sxs/prior treatment) Patient is a 42 y.o. female presenting with cough. The history is provided by the patient. No language interpreter was used.  Cough Cough characteristics:  Productive Sputum characteristics:  Nondescript Severity:  Moderate Timing:  Constant Progression:  Worsening Chronicity:  New Relieved by:  Nothing Worsened by:  Nothing tried   Past Medical History  Diagnosis Date  . Hepatitis B infection   . Cervical cancer     Stage 2  . Degenerative disk disease   . Ovarian cyst   . Chronic UTI   . Anxiety     Past Surgical History  Procedure Laterality Date  . Tubal ligation    . Mandible fracture surgery    . Wisdom tooth extraction      No family history on file.  History  Substance Use Topics  . Smoking status: Current Every Day Smoker -- 0.50 packs/day for 25 years    Types: Cigarettes  . Smokeless tobacco: Never Used  . Alcohol Use: No    OB History   Grav Para Term Preterm Abortions TAB SAB Ect Mult Living                  Review of Systems  Respiratory: Positive for cough.   All other systems reviewed and are negative.    Allergies  Citalopram and Povidone  Home Medications   Current Outpatient Rx  Name  Route  Sig  Dispense  Refill  . ALPRAZolam (XANAX) 1 MG tablet   Oral   Take 1 tablet (1 mg total) by mouth 3 (three) times daily. No early refills   90 tablet   2   . cyclobenzaprine (FLEXERIL) 10 MG tablet   Oral   Take 10 mg by mouth 3 (three) times daily as needed for muscle spasms.         Marland Kitchen gabapentin (NEURONTIN) 300 MG capsule   Oral   Take 300 mg by mouth 3 (three) times daily.         . traMADol (ULTRAM) 50 MG tablet   Oral   Take 50 mg  by mouth every 6 (six) hours as needed for pain.           BP 126/72  Pulse 74  Temp(Src) 98.5 F (36.9 C) (Oral)  Resp 24  Ht 5\' 6"  (1.676 m)  Wt 175 lb (79.379 kg)  BMI 28.26 kg/m2  SpO2 91%  LMP 08/05/2012  Physical Exam  Nursing note and vitals reviewed. Constitutional: She appears well-developed and well-nourished.  HENT:  Head: Normocephalic.  Right Ear: External ear normal.  Left Ear: External ear normal.  Nose: Nose normal.  Mouth/Throat: Oropharynx is clear and moist.  Eyes: Conjunctivae are normal. Pupils are equal, round, and reactive to light.  Neck: Normal range of motion. Neck supple.  Cardiovascular: Normal rate.   Pulmonary/Chest: Effort normal and breath sounds normal.  Abdominal: Soft.  Neurological: She is alert.  Skin: Skin is warm.  Psychiatric: She has a normal mood and affect.    ED Course  Procedures (including critical care time)  Labs Reviewed  CBC WITH DIFFERENTIAL - Abnormal; Notable for the following:    HCT 35.9 (*)    All other  components within normal limits  COMPREHENSIVE METABOLIC PANEL - Abnormal; Notable for the following:    Glucose, Bld 126 (*)    Albumin 3.3 (*)    ALT 80 (*)    All other components within normal limits  TROPONIN I   Dg Chest 2 View  08/26/2012  *RADIOLOGY REPORT*  Clinical Data: Cough, chest pain, dizziness  CHEST - 2 VIEW  Comparison: 11/08/2010  Findings: Perihilar and bibasilar patchy airspace process compatible with pneumonia, including atypical infections, as well as asymmetric edema. Prominent heart size.  No effusion or pneumothorax.  Trachea is midline.  IMPRESSION: Central perihilar and bibasilar patchy airspace process compatible with pneumonia or asymmetric edema.   Original Report Authenticated By: Judie Petit. Shick, M.D.      1. Community acquired pneumonia       MDM  Pt given rocephin, zithromax and dilaudid for pain.  Pt reports he feels better after albuterol treatment.  abg obtained.   Pt  does not want to be admitted to hospital.  Pt agrees to return for evaluation if symptoms worsen or change.        Lonia Skinner [place], PA-C 08/26/12 [phone]

## 2012-08-28 NOTE — ED Provider Notes (Signed)
History/physical exam/procedure(s) were performed by non-physician practitioner and as supervising physician I was immediately available for consultation/collaboration. I have reviewed all notes and am in agreement with care and plan.   Hilario Quarry, MD 08/28/12 1003

## 2012-09-02 ENCOUNTER — Inpatient Hospital Stay: Payer: Self-pay | Admitting: Family Medicine

## 2012-10-04 ENCOUNTER — Other Ambulatory Visit: Payer: Self-pay | Admitting: *Deleted

## 2012-10-04 DIAGNOSIS — M545 Low back pain: Secondary | ICD-10-CM

## 2012-10-05 ENCOUNTER — Other Ambulatory Visit: Payer: Self-pay | Admitting: *Deleted

## 2012-10-05 DIAGNOSIS — M545 Low back pain: Secondary | ICD-10-CM

## 2012-10-05 MED ORDER — CYCLOBENZAPRINE HCL 10 MG PO TABS: 10.0000 mg | ORAL_TABLET | Freq: Three times a day (TID) | ORAL | Status: DC | PRN

## 2012-10-05 NOTE — Assessment & Plan Note (Signed)
Refilled muscle relaxant. 

## 2012-10-10 ENCOUNTER — Other Ambulatory Visit: Payer: Self-pay | Admitting: Family Medicine

## 2012-10-10 DIAGNOSIS — F411 Generalized anxiety disorder: Secondary | ICD-10-CM

## 2012-10-10 MED ORDER — ALPRAZOLAM 1 MG PO TABS: 1.0000 mg | ORAL_TABLET | Freq: Three times a day (TID) | ORAL | Status: DC

## 2012-10-10 NOTE — Telephone Encounter (Signed)
Refilled based on fax request

## 2012-10-29 ENCOUNTER — Emergency Department (HOSPITAL_COMMUNITY)
Admission: EM | Admit: 2012-10-29 | Discharge: 2012-10-29 | Disposition: A | Discharge status: Home / Self Care | Payer: Medicaid Other | Attending: Emergency Medicine | Admitting: Emergency Medicine

## 2012-10-29 ENCOUNTER — Other Ambulatory Visit: Payer: Self-pay | Admitting: Family Medicine

## 2012-10-29 DIAGNOSIS — Z8742 Personal history of other diseases of the female genital tract: Secondary | ICD-10-CM | POA: Insufficient documentation

## 2012-10-29 DIAGNOSIS — T43502A Poisoning by unspecified antipsychotics and neuroleptics, intentional self-harm, initial encounter: Secondary | ICD-10-CM | POA: Insufficient documentation

## 2012-10-29 DIAGNOSIS — Z8739 Personal history of other diseases of the musculoskeletal system and connective tissue: Secondary | ICD-10-CM | POA: Insufficient documentation

## 2012-10-29 DIAGNOSIS — Z8541 Personal history of malignant neoplasm of cervix uteri: Secondary | ICD-10-CM | POA: Insufficient documentation

## 2012-10-29 DIAGNOSIS — F329 Major depressive disorder, single episode, unspecified: Secondary | ICD-10-CM | POA: Insufficient documentation

## 2012-10-29 DIAGNOSIS — F3289 Other specified depressive episodes: Secondary | ICD-10-CM | POA: Insufficient documentation

## 2012-10-29 DIAGNOSIS — T50992A Poisoning by other drugs, medicaments and biological substances, intentional self-harm, initial encounter: Secondary | ICD-10-CM | POA: Insufficient documentation

## 2012-10-29 DIAGNOSIS — Z87440 Personal history of urinary (tract) infections: Secondary | ICD-10-CM | POA: Insufficient documentation

## 2012-10-29 DIAGNOSIS — F17200 Nicotine dependence, unspecified, uncomplicated: Secondary | ICD-10-CM | POA: Insufficient documentation

## 2012-10-29 DIAGNOSIS — T426X1A Poisoning by other antiepileptic and sedative-hypnotic drugs, accidental (unintentional), initial encounter: Secondary | ICD-10-CM | POA: Insufficient documentation

## 2012-10-29 DIAGNOSIS — Z79899 Other long term (current) drug therapy: Secondary | ICD-10-CM | POA: Insufficient documentation

## 2012-10-29 DIAGNOSIS — Z9189 Other specified personal risk factors, not elsewhere classified: Secondary | ICD-10-CM

## 2012-10-29 DIAGNOSIS — Z8619 Personal history of other infectious and parasitic diseases: Secondary | ICD-10-CM | POA: Insufficient documentation

## 2012-10-29 DIAGNOSIS — F411 Generalized anxiety disorder: Secondary | ICD-10-CM | POA: Insufficient documentation

## 2012-10-29 DIAGNOSIS — T438X2A Poisoning by other psychotropic drugs, intentional self-harm, initial encounter: Secondary | ICD-10-CM | POA: Insufficient documentation

## 2012-10-29 DIAGNOSIS — T424X4A Poisoning by benzodiazepines, undetermined, initial encounter: Secondary | ICD-10-CM | POA: Insufficient documentation

## 2012-10-29 LAB — COMPREHENSIVE METABOLIC PANEL
ALT: 17 U/L (ref 0–35)
AST: 20 U/L (ref 0–37)
Albumin: 3.8 g/dL (ref 3.5–5.2)
Alkaline Phosphatase: 51 U/L (ref 39–117)
Chloride: 104 mEq/L (ref 96–112)
Potassium: 3.8 mEq/L (ref 3.5–5.1)
Sodium: 141 mEq/L (ref 135–145)
Total Bilirubin: 0.2 mg/dL — ABNORMAL LOW (ref 0.3–1.2)
Total Protein: 7 g/dL (ref 6.0–8.3)

## 2012-10-29 LAB — ACETAMINOPHEN LEVEL: Acetaminophen (Tylenol), Serum: <15 ug/mL (ref 10–30)

## 2012-10-29 LAB — CBC WITH DIFFERENTIAL/PLATELET
Basophils Absolute: 0 10*3/uL (ref 0.0–0.1)
Basophils Relative: 0 % (ref 0–1)
Eosinophils Absolute: 0.1 10*3/uL (ref 0.0–0.7)
Hemoglobin: 13.3 g/dL (ref 12.0–15.0)
MCH: 30.4 pg (ref 26.0–34.0)
MCHC: 33.4 g/dL (ref 30.0–36.0)
Monocytes Relative: 8 % (ref 3–12)
Neutro Abs: 3 10*3/uL (ref 1.7–7.7)
Neutrophils Relative %: 50 % (ref 43–77)
Platelets: 242 10*3/uL (ref 150–400)
RBC: 4.37 MIL/uL (ref 3.87–5.11)

## 2012-10-29 LAB — RAPID URINE DRUG SCREEN, HOSP PERFORMED
Amphetamines: NOT DETECTED
Barbiturates: NOT DETECTED
Benzodiazepines: POSITIVE — AB
Tetrahydrocannabinol: NOT DETECTED

## 2012-10-29 LAB — ETHANOL: Alcohol, Ethyl (B): <11 mg/dL (ref 0–11)

## 2012-10-29 LAB — SALICYLATE LEVEL: Salicylate Lvl: <2 mg/dL — ABNORMAL LOW (ref 2.8–20.0)

## 2012-10-29 MED ORDER — LORAZEPAM 1 MG PO TABS: 1.0000 mg | ORAL_TABLET | Freq: Three times a day (TID) | ORAL | Status: DC | PRN

## 2012-10-29 MED ORDER — ALPRAZOLAM 1 MG PO TABS: 1.0000 mg | ORAL_TABLET | Freq: Three times a day (TID) | ORAL | Status: DC

## 2012-10-29 MED ORDER — SODIUM CHLORIDE 0.9 % IV BOLUS (SEPSIS)
1000.0000 mL | Freq: Once | INTRAVENOUS | Status: AC
  Administered 2012-10-29: 1000 mL via INTRAVENOUS

## 2012-10-29 MED ORDER — ALBUTEROL SULFATE HFA 108 (90 BASE) MCG/ACT IN AERS: 1.0000 | INHALATION_SPRAY | Freq: Four times a day (QID) | RESPIRATORY_TRACT | Status: DC | PRN

## 2012-10-29 MED ORDER — GABAPENTIN 300 MG PO CAPS
300.0000 mg | ORAL_CAPSULE | Freq: Three times a day (TID) | ORAL | Status: DC
  Filled 2012-10-29 (×2): qty 1

## 2012-10-29 NOTE — ED Notes (Signed)
Pt has shorts, belt with large buckle, tank top, bra, shoes and socks with her. Shorts soaked with urine.

## 2012-10-29 NOTE — ED Notes (Signed)
Poison control called, already spoke with Dr. Estell Harpin for recommendations. Will continue to monitor and offer supportive care
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
## 2012-10-29 NOTE — ED Provider Notes (Addendum)
History     CSN: [id number]  Arrival date & time 10/29/12  0927   First MD Initiated Contact with Patient 10/29/12 0930      Chief Complaint  Patient presents with  . Drug Overdose    (Consider location/radiation/quality/duration/timing/severity/associated sxs/prior treatment) Patient is a 42 y.o. female presenting with Overdose. The history is provided by the patient (pt took 10 xanax and 20-30 neurotin at 8am to hurt herself).  Drug Overdose This is a new problem. The current episode started 1 to 2 hours ago. The problem occurs constantly. The problem has not changed since onset.Pertinent negatives include no chest pain, no abdominal pain and no headaches. Nothing aggravates the symptoms. Nothing relieves the symptoms.    Past Medical History  Diagnosis Date  . Hepatitis B infection   . Cervical cancer     Stage 2  . Degenerative disk disease   . Ovarian cyst   . Chronic UTI   . Anxiety     Past Surgical History  Procedure Laterality Date  . Tubal ligation    . Mandible fracture surgery    . Wisdom tooth extraction      No family history on file.  History  Substance Use Topics  . Smoking status: Current Every Day Smoker -- 0.50 packs/day for 25 years    Types: Cigarettes  . Smokeless tobacco: Never Used  . Alcohol Use: No    OB History   Grav Para Term Preterm Abortions TAB SAB Ect Mult Living                  Review of Systems  Constitutional: Negative for appetite change and fatigue.  HENT: Negative for congestion, sinus pressure and ear discharge.   Eyes: Negative for discharge.  Respiratory: Negative for cough.   Cardiovascular: Negative for chest pain.  Gastrointestinal: Negative for abdominal pain and diarrhea.  Genitourinary: Negative for frequency and hematuria.  Musculoskeletal: Negative for back pain.  Skin: Negative for rash.  Neurological: Negative for seizures and headaches.  Psychiatric/Behavioral: Negative for hallucinations.   Depressed and suicidal    Allergies  Citalopram and Povidone  Home Medications   Current Outpatient Rx  Name  Route  Sig  Dispense  Refill  . albuterol (PROVENTIL HFA;VENTOLIN HFA) 108 (90 BASE) MCG/ACT inhaler   Inhalation   Inhale 1-2 puffs into the lungs every 6 (six) hours as needed for wheezing.   1 Inhaler   0   . ALPRAZolam (XANAX) 1 MG tablet   Oral   Take 1 tablet (1 mg total) by mouth 3 (three) times daily. No early refills   90 tablet   2   . gabapentin (NEURONTIN) 300 MG capsule   Oral   Take 300 mg by mouth 3 (three) times daily.           BP 105/74  Pulse 73  Temp(Src) 97.5 F (36.4 C) (Oral)  Resp 17  SpO2 96%  Physical Exam  Constitutional: She is oriented to person, place, and time. She appears well-developed.  HENT:  Head: Normocephalic.  Eyes: Conjunctivae and EOM are normal. No scleral icterus.  Neck: Neck supple. No thyromegaly present.  Cardiovascular: Normal rate and regular rhythm.  Exam reveals no gallop and no friction rub.   No murmur heard. Pulmonary/Chest: No stridor. She has no wheezes. She has no rales. She exhibits no tenderness.  Abdominal: She exhibits no distension. There is no tenderness. There is no rebound.  Musculoskeletal: Normal range of motion. She  exhibits no edema.  Lymphadenopathy:    She has no cervical adenopathy.  Neurological: She is oriented to person, place, and time. Coordination normal.  Skin: No rash noted. No erythema.  Psychiatric: Her behavior is normal.  Depressed and suicidal    ED Course  Procedures (including critical care time)  Labs Reviewed  COMPREHENSIVE METABOLIC PANEL - Abnormal; Notable for the following:    Total Bilirubin 0.2 (*)    All other components within normal limits  SALICYLATE LEVEL - Abnormal; Notable for the following:    Salicylate Lvl <2.0 (*)    All other components within normal limits  CBC WITH DIFFERENTIAL  ACETAMINOPHEN LEVEL  ETHANOL  URINE RAPID DRUG SCREEN  (HOSP PERFORMED)   No results found.   No diagnosis found.    MDM     Will move to pysc and get pt admitted     Benny Lennert, MD 10/29/12 1246  Benny Lennert, MD 10/29/12 1246  Benny Lennert, MD 10/29/12 [phone]

## 2012-10-29 NOTE — ED Notes (Signed)
D/C instructions and resource info given. Escorted with security to front of hospital. Belongings returned. Ambulatory without difficulty.
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
## 2012-12-28 ENCOUNTER — Telehealth: Payer: Self-pay | Admitting: Family Medicine

## 2012-12-28 DIAGNOSIS — M545 Low back pain, unspecified: Secondary | ICD-10-CM

## 2012-12-28 DIAGNOSIS — F411 Generalized anxiety disorder: Secondary | ICD-10-CM

## 2012-12-28 NOTE — Telephone Encounter (Signed)
Pt called to lets [place] know that the pharmacy is faxing a refill request to [place] [facility]

## 2012-12-28 NOTE — Telephone Encounter (Signed)
I have no intention of refilling her alprazolam based on OD and ER visit of 10/28/12.

## 2012-12-28 NOTE — Telephone Encounter (Signed)
Requesting refill on Alprazolam - not on MAR. Wyatt Haste, RN-BSN

## 2013-01-04 ENCOUNTER — Telehealth: Payer: Self-pay | Admitting: Family Medicine

## 2013-01-04 ENCOUNTER — Emergency Department (HOSPITAL_BASED_OUTPATIENT_CLINIC_OR_DEPARTMENT_OTHER)
Admission: EM | Admit: 2013-01-04 | Discharge: 2013-01-04 | Disposition: A | Discharge status: Home / Self Care | Payer: Medicaid Other | Attending: Emergency Medicine | Admitting: Emergency Medicine

## 2013-01-04 ENCOUNTER — Encounter (HOSPITAL_BASED_OUTPATIENT_CLINIC_OR_DEPARTMENT_OTHER): Payer: Self-pay | Admitting: Family Medicine

## 2013-01-04 DIAGNOSIS — N6459 Other signs and symptoms in breast: Secondary | ICD-10-CM | POA: Insufficient documentation

## 2013-01-04 DIAGNOSIS — Z8739 Personal history of other diseases of the musculoskeletal system and connective tissue: Secondary | ICD-10-CM | POA: Insufficient documentation

## 2013-01-04 DIAGNOSIS — F411 Generalized anxiety disorder: Secondary | ICD-10-CM | POA: Insufficient documentation

## 2013-01-04 DIAGNOSIS — Z8541 Personal history of malignant neoplasm of cervix uteri: Secondary | ICD-10-CM | POA: Insufficient documentation

## 2013-01-04 DIAGNOSIS — F319 Bipolar disorder, unspecified: Secondary | ICD-10-CM | POA: Insufficient documentation

## 2013-01-04 DIAGNOSIS — R21 Rash and other nonspecific skin eruption: Secondary | ICD-10-CM | POA: Insufficient documentation

## 2013-01-04 DIAGNOSIS — L539 Erythematous condition, unspecified: Secondary | ICD-10-CM | POA: Insufficient documentation

## 2013-01-04 DIAGNOSIS — D493 Neoplasm of unspecified behavior of breast: Secondary | ICD-10-CM

## 2013-01-04 DIAGNOSIS — N61 Mastitis without abscess: Secondary | ICD-10-CM | POA: Insufficient documentation

## 2013-01-04 DIAGNOSIS — R509 Fever, unspecified: Secondary | ICD-10-CM | POA: Insufficient documentation

## 2013-01-04 DIAGNOSIS — Z8619 Personal history of other infectious and parasitic diseases: Secondary | ICD-10-CM | POA: Insufficient documentation

## 2013-01-04 DIAGNOSIS — F17200 Nicotine dependence, unspecified, uncomplicated: Secondary | ICD-10-CM | POA: Insufficient documentation

## 2013-01-04 DIAGNOSIS — Z8742 Personal history of other diseases of the female genital tract: Secondary | ICD-10-CM | POA: Insufficient documentation

## 2013-01-04 DIAGNOSIS — N6320 Unspecified lump in the left breast, unspecified quadrant: Secondary | ICD-10-CM

## 2013-01-04 DIAGNOSIS — Z87440 Personal history of urinary (tract) infections: Secondary | ICD-10-CM | POA: Insufficient documentation

## 2013-01-04 HISTORY — DX: Bipolar disorder, unspecified: F31.9

## 2013-01-04 LAB — CBC WITH DIFFERENTIAL/PLATELET
Basophils Absolute: 0 10*3/uL (ref 0.0–0.1)
Basophils Relative: 0 % (ref 0–1)
Eosinophils Absolute: 0.2 10*3/uL (ref 0.0–0.7)
Hemoglobin: 12.8 g/dL (ref 12.0–15.0)
MCH: 31.7 pg (ref 26.0–34.0)
MCHC: 34 g/dL (ref 30.0–36.0)
Monocytes Relative: 8 % (ref 3–12)
Neutrophils Relative %: 56 % (ref 43–77)
RDW: 13.5 % (ref 11.5–15.5)

## 2013-01-04 LAB — BASIC METABOLIC PANEL
BUN: 15 mg/dL (ref 6–23)
Creatinine, Ser: 0.7 mg/dL (ref 0.50–1.10)
GFR calc Af Amer: >90 mL/min (ref >90)
GFR calc non Af Amer: >90 mL/min (ref >90)
Potassium: 3.7 mEq/L (ref 3.5–5.1)

## 2013-01-04 MED ORDER — IBUPROFEN 600 MG PO TABS: 600.0000 mg | ORAL_TABLET | Freq: Four times a day (QID) | ORAL | Status: DC | PRN

## 2013-01-04 MED ORDER — CEPHALEXIN 500 MG PO CAPS: 500.0000 mg | ORAL_CAPSULE | Freq: Four times a day (QID) | ORAL | Status: DC

## 2013-01-04 MED ORDER — HYDROCODONE-ACETAMINOPHEN 5-325 MG PO TABS
1.0000 | ORAL_TABLET | Freq: Once | ORAL | Status: AC
  Administered 2013-01-04: 1 via ORAL
  Filled 2013-01-04: qty 1

## 2013-01-04 MED ORDER — SULFAMETHOXAZOLE-TRIMETHOPRIM 800-160 MG PO TABS: 1.0000 | ORAL_TABLET | Freq: Two times a day (BID) | ORAL | Status: DC

## 2013-01-04 MED ORDER — HYDROCODONE-ACETAMINOPHEN 5-325 MG PO TABS: 1.0000 | ORAL_TABLET | Freq: Four times a day (QID) | ORAL | Status: DC | PRN

## 2013-01-04 NOTE — ED Notes (Signed)
Pt c/o mass and pain to left breast for "8 years" but worse this past week.

## 2013-01-04 NOTE — ED Provider Notes (Signed)
CSN: [id number]     Arrival date & time 01/04/13  1050 History     First MD Initiated Contact with Patient 01/04/13 1122     Chief Complaint  Patient presents with  . Breast Mass   (Consider location/radiation/quality/duration/timing/severity/associated sxs/prior Treatment) HPI Comments: Pt is a 42 year old female who presents to the ED with pain and swelling in the left breast. She noticed a mass in her L breast several months ago but has not had it evaluated due to lack of insurance. The mass is painful and she has recently noted some erythema overlying the painful area. The pain is dull and throbbing, no radiation. She has noted a light yellow discharge from the L nipple.  Patient admits to nausea, vomiting, fever, and chills during the last week. LMP one week ago, she denies any changes to the mass during her menstrual cycles. Menstrual cycles are every 30 days and regular. She had a mammogram 10 years ago but no recent imaging. FH of breast cancer in patient's mother. Pt does not know at what age mother was diagnosed or the stage of cancer.   The history is provided by the patient.    Past Medical History  Diagnosis Date  . Hepatitis B infection   . Cervical cancer     Stage 2  . Degenerative disk disease   . Ovarian cyst   . Chronic UTI   . Anxiety   . Bipolar 1 disorder    Past Surgical History  Procedure Laterality Date  . Tubal ligation    . Mandible fracture surgery    . Wisdom tooth extraction     No family history on file. History  Substance Use Topics  . Smoking status: Current Every Day Smoker -- 0.50 packs/day for 25 years    Types: Cigarettes  . Smokeless tobacco: Never Used  . Alcohol Use: No   OB History   Grav Para Term Preterm Abortions TAB SAB Ect Mult Living                 Review of Systems  Constitutional: Positive for fever and chills. Negative for activity change.  HENT: Negative for neck pain.   Respiratory: Negative for shortness of breath.    Cardiovascular: Negative for chest pain.  Gastrointestinal: Negative for nausea, vomiting and abdominal pain.  Genitourinary: Negative for dysuria.  Skin: Positive for rash.  Neurological: Negative for headaches.    Allergies  Citalopram and Povidone  Home Medications   Current Outpatient Rx  Name  Route  Sig  Dispense  Refill  . ALPRAZolam (XANAX) 1 MG tablet   Oral   Take 1 mg by mouth 3 (three) times daily as needed for sleep.         Marland Kitchen albuterol (PROVENTIL HFA;VENTOLIN HFA) 108 (90 BASE) MCG/ACT inhaler   Inhalation   Inhale 1-2 puffs into the lungs every 6 (six) hours as needed for wheezing.   1 Inhaler   0   . gabapentin (NEURONTIN) 300 MG capsule   Oral   Take 300 mg by mouth 3 (three) times daily.          BP 124/81  Pulse 97  Temp(Src) 97.3 F (36.3 C) (Oral)  Resp 20  SpO2 97%  LMP 12/26/2012 Physical Exam  Nursing note and vitals reviewed. Constitutional: She is oriented to person, place, and time. She appears well-developed and well-nourished.  HENT:  Head: Normocephalic and atraumatic.  Eyes: EOM are normal. Pupils are equal,  round, and reactive to light.  Neck: Neck supple.  Cardiovascular: Normal rate, regular rhythm and normal heart sounds.   No murmur heard. Pulmonary/Chest: Effort normal. No respiratory distress.  Abdominal: Soft. She exhibits no distension. There is no tenderness. There is no rebound and no guarding.  Neurological: She is alert and oriented to person, place, and time.  Skin: Skin is warm and dry.  Left breast - pt has a 10 cm area of erythema just at 3'0 clock area at the areola, and there is 6 cm are of palpable, firm nodule, appears fixed. + tenderness    ED Course   Procedures (including critical care time)  Labs Reviewed  CBC WITH DIFFERENTIAL  BASIC METABOLIC PANEL   No results found. 1. Left breast mass     MDM  Pt comes in with cc of left breast mass. Pt's exam is concerning for infection - cellulitis,  and also possibly tumor - which could be malignant. [facility] and [facility] an appt for 21st August. Pt's PCP will be forwarded the results.   Derwood Kaplan, MD 01/04/13 1258

## 2013-01-04 NOTE — ED Notes (Signed)
Dr. Rhunette Croft in room discussing discharge instructions with the patient.  The patient verbalized understanding and requested pain medications along with her antibiotics. Pt became very upset, crying and anxious after the doctor left the room. Pt began talking in a very loud voice stating that she came here today to get everything done and she does not understand why she can not get her ultrasound or biopsy done while she is here. Pt was asked to stop yelling at this nurse.  Informed pt that I would find the doctor to inquire why she was not getting her ultrasound.  S/W Dr. Rhunette Croft and was told that breast ultrasound is not available in this facility.  Upon returning to the room, info was given to the pt.  Pt stated that she did not yell and demanded my name.  Pt refused to be walked out, stating, she knows her own way out.

## 2013-01-06 NOTE — Telephone Encounter (Signed)
error 

## 2013-01-09 ENCOUNTER — Other Ambulatory Visit: Payer: Self-pay | Admitting: *Deleted

## 2013-01-09 DIAGNOSIS — N6320 Unspecified lump in the left breast, unspecified quadrant: Secondary | ICD-10-CM

## 2013-01-10 ENCOUNTER — Ambulatory Visit (HOSPITAL_COMMUNITY): Payer: Self-pay

## 2013-01-19 ENCOUNTER — Other Ambulatory Visit: Payer: Self-pay

## 2013-01-19 ENCOUNTER — Inpatient Hospital Stay: Admit: 2013-01-19 | Payer: Self-pay

## 2013-01-24 ENCOUNTER — Other Ambulatory Visit: Payer: Self-pay

## 2013-01-31 ENCOUNTER — Other Ambulatory Visit: Payer: Self-pay | Admitting: *Deleted

## 2013-01-31 MED ORDER — GABAPENTIN 300 MG PO CAPS: 300.0000 mg | ORAL_CAPSULE | Freq: Three times a day (TID) | ORAL | Status: DC

## 2013-02-01 ENCOUNTER — Telehealth: Payer: Self-pay | Admitting: *Deleted

## 2013-02-01 NOTE — Telephone Encounter (Signed)
Requesting refill for tramadol - refills are out of date Wyatt Haste, RN-BSN

## 2013-02-02 ENCOUNTER — Other Ambulatory Visit: Payer: Self-pay | Admitting: Family Medicine

## 2013-02-02 NOTE — Telephone Encounter (Signed)
Encounter created in error.  No meds prescribed.

## 2013-02-02 NOTE — Telephone Encounter (Signed)
Attempted to call pt at both numbers - both numbers are invalid. Wyatt Haste, RN-BSN

## 2013-02-02 NOTE — Telephone Encounter (Signed)
Tramadol is a controled substance now.  No controled substances through this office.  No alprazolam, no narcotics no tramadol.  OK if she wants ibuprofen, I will refill.

## 2013-02-15 ENCOUNTER — Ambulatory Visit: Payer: Self-pay | Admitting: Family Medicine

## 2013-02-22 ENCOUNTER — Ambulatory Visit: Payer: Self-pay | Admitting: Family Medicine

## 2013-03-10 ENCOUNTER — Ambulatory Visit (INDEPENDENT_AMBULATORY_CARE_PROVIDER_SITE_OTHER): Payer: Medicaid Other | Admitting: Family Medicine

## 2013-03-10 DIAGNOSIS — M545 Low back pain: Secondary | ICD-10-CM

## 2013-03-13 NOTE — Progress Notes (Signed)
  Subjective:    Patient ID: Kylie [name], female    DOB: [birth year], 42 y.o.   MRN: [id number]  HPI Encounter created in error.  Patient not seen    Review of Systems     Objective:   Physical Exam        Assessment & Plan:

## 2013-03-13 NOTE — Assessment & Plan Note (Signed)
Encounter created in error

## 2013-03-16 ENCOUNTER — Emergency Department (HOSPITAL_BASED_OUTPATIENT_CLINIC_OR_DEPARTMENT_OTHER)
Admission: EM | Admit: 2013-03-16 | Discharge: 2013-03-16 | Disposition: A | Discharge status: Home / Self Care | Payer: Medicaid Other | Attending: Emergency Medicine | Admitting: Emergency Medicine

## 2013-03-16 ENCOUNTER — Encounter (HOSPITAL_BASED_OUTPATIENT_CLINIC_OR_DEPARTMENT_OTHER): Payer: Self-pay | Admitting: Emergency Medicine

## 2013-03-16 ENCOUNTER — Emergency Department (HOSPITAL_BASED_OUTPATIENT_CLINIC_OR_DEPARTMENT_OTHER): Payer: Medicaid Other

## 2013-03-16 DIAGNOSIS — J40 Bronchitis, not specified as acute or chronic: Secondary | ICD-10-CM

## 2013-03-16 DIAGNOSIS — M549 Dorsalgia, unspecified: Secondary | ICD-10-CM | POA: Insufficient documentation

## 2013-03-16 DIAGNOSIS — F17200 Nicotine dependence, unspecified, uncomplicated: Secondary | ICD-10-CM | POA: Insufficient documentation

## 2013-03-16 DIAGNOSIS — M2550 Pain in unspecified joint: Secondary | ICD-10-CM

## 2013-03-16 DIAGNOSIS — Z8742 Personal history of other diseases of the female genital tract: Secondary | ICD-10-CM | POA: Insufficient documentation

## 2013-03-16 DIAGNOSIS — Z8541 Personal history of malignant neoplasm of cervix uteri: Secondary | ICD-10-CM | POA: Insufficient documentation

## 2013-03-16 DIAGNOSIS — F410 Panic disorder [episodic paroxysmal anxiety] without agoraphobia: Secondary | ICD-10-CM | POA: Insufficient documentation

## 2013-03-16 DIAGNOSIS — M25559 Pain in unspecified hip: Secondary | ICD-10-CM | POA: Insufficient documentation

## 2013-03-16 DIAGNOSIS — J209 Acute bronchitis, unspecified: Secondary | ICD-10-CM | POA: Insufficient documentation

## 2013-03-16 DIAGNOSIS — Z8619 Personal history of other infectious and parasitic diseases: Secondary | ICD-10-CM | POA: Insufficient documentation

## 2013-03-16 DIAGNOSIS — Z87440 Personal history of urinary (tract) infections: Secondary | ICD-10-CM | POA: Insufficient documentation

## 2013-03-16 DIAGNOSIS — Z79899 Other long term (current) drug therapy: Secondary | ICD-10-CM | POA: Insufficient documentation

## 2013-03-16 DIAGNOSIS — F319 Bipolar disorder, unspecified: Secondary | ICD-10-CM | POA: Insufficient documentation

## 2013-03-16 HISTORY — DX: Panic disorder (episodic paroxysmal anxiety): F41.0

## 2013-03-16 MED ORDER — HYDROCOD POLST-CHLORPHEN POLST 10-8 MG/5ML PO LQCR: 5.0000 mL | Freq: Two times a day (BID) | ORAL | Status: DC | PRN

## 2013-03-16 MED ORDER — ALBUTEROL SULFATE HFA 108 (90 BASE) MCG/ACT IN AERS: 2.0000 | INHALATION_SPRAY | RESPIRATORY_TRACT | Status: DC | PRN

## 2013-03-16 MED ORDER — PREDNISONE 20 MG PO TABS: ORAL_TABLET | ORAL | Status: DC

## 2013-03-16 MED ORDER — AZITHROMYCIN 250 MG PO TABS: 250.0000 mg | ORAL_TABLET | Freq: Every day | ORAL | Status: DC

## 2013-03-16 NOTE — ED Provider Notes (Signed)
CSN: [id number]     Arrival date & time 03/16/13  1133 History   First MD Initiated Contact with Patient 03/16/13 1204     Chief Complaint  Patient presents with  . Cough  . Back Pain   (Consider location/radiation/quality/duration/timing/severity/associated sxs/prior Treatment) HPI Comments: Patient presents to the ER for evaluation of cough. This report was given Cipro approximately a month. She has had progressively worsening upper respiratory infection symptoms. Patient reports cough, congestion, shortness of breath. She has been using over-the-counter medications without improvement.  Patient also complains about left hip pain. 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 She was supposed to see orthopedics in the past, but did not have insurance. Patient reports that she is now having severe pain in the hip, going on for months. She is having trouble walking because of increased pain with ambulation.  Patient is a 42 y.o. female presenting with cough and back pain.  Cough Associated symptoms: shortness of breath   Back Pain   Past Medical History  Diagnosis Date  . Hepatitis B infection   . Cervical cancer     Stage 2  . Degenerative disk disease   . Ovarian cyst   . Chronic UTI   . Anxiety   . Bipolar 1 disorder   . Panic attack    Past Surgical History  Procedure Laterality Date  . Tubal ligation    . Mandible fracture surgery    . Wisdom tooth extraction     No family history on file. History  Substance Use Topics  . Smoking status: Current Every Day Smoker -- 1.00 packs/day for 25 years    Types: Cigarettes  . Smokeless tobacco: Never Used  . Alcohol Use: No   OB History   Grav Para Term Preterm Abortions TAB SAB Ect Mult Living                 Review of Systems  Respiratory: Positive for cough, chest tightness and shortness of breath.   Musculoskeletal: Positive for arthralgias and back pain.  All other systems  reviewed and are negative.    Allergies  Citalopram and Povidone  Home Medications   Current Outpatient Rx  Name  Route  Sig  Dispense  Refill  . albuterol (PROVENTIL HFA;VENTOLIN HFA) 108 (90 BASE) MCG/ACT inhaler   Inhalation   Inhale 1-2 puffs into the lungs every 6 (six) hours as needed for wheezing.   1 Inhaler   0   . gabapentin (NEURONTIN) 300 MG capsule   Oral   Take 1 capsule (300 mg total) by mouth 3 (three) times daily.   90 capsule   5    BP 134/87  Pulse 90  Temp(Src) 98.6 F (37 C) (Oral)  Resp 20  Ht 5\' 6"  (1.676 m)  Wt 170 lb (77.111 kg)  BMI 27.45 kg/m2  SpO2 99%  LMP 03/15/2013 Physical Exam  Constitutional: She is oriented to person, place, and time. She appears well-developed and well-nourished. No distress.  HENT:  Head: Normocephalic and atraumatic.  Right Ear: Hearing normal.  Left Ear: Hearing normal.  Nose: Nose normal.  Mouth/Throat: Oropharynx is clear and moist and mucous membranes are normal.  Eyes: Conjunctivae and EOM are normal. Pupils are equal, round, and reactive to light.  Neck: Normal range of motion. Neck supple.  Cardiovascular: Regular rhythm, S1 normal and S2 normal.  Exam reveals no gallop and no friction rub.   No murmur heard. Pulmonary/Chest:  Effort normal. No respiratory distress. She has decreased breath sounds. She exhibits no tenderness.  Abdominal: Soft. Normal appearance and bowel sounds are normal. There is no hepatosplenomegaly. There is no tenderness. There is no rebound, no guarding, no tenderness at McBurney's point and negative Murphy's sign. No hernia.  Musculoskeletal: Normal range of motion.  Neurological: She is alert and oriented to person, place, and time. She has normal strength. No cranial nerve deficit or sensory deficit. Coordination normal. GCS eye subscore is 4. GCS verbal subscore is 5. GCS motor subscore is 6.  Skin: Skin is warm, dry and intact. No rash noted. No cyanosis.  Psychiatric: She has  a normal mood and affect. Her speech is normal and behavior is normal. Thought content normal.    ED Course  Procedures (including critical care time) Labs Review Labs Reviewed - No data to display Imaging Review Dg Chest 2 View  03/16/2013   CLINICAL DATA:  Cough and congestion for 2 months  EXAM: CHEST  2 VIEW  COMPARISON:  08/26/2012  FINDINGS: The cardiac shadow is within normal limits. The lungs are well aerated bilaterally. No focal infiltrate or sizable effusion is seen.  IMPRESSION: No active cardiopulmonary disease.   Electronically Signed   By: Alcide Clever M.D.   On: 03/16/2013 11:54    EKG Interpretation   None       MDM  Diagnosis: 1. Bronchitis 2. Joint pain, left hip  Patient presents to ER for one month with a progressively worsening cough congestion. She is a smoker. She does not have any clinical signs of pneumonia. She is not actively wheezing, but likely has some bronchospasm. She reports worsening of the cough at night. Patient will be treated with Zithromax, albuterol, prednisone, Tussionex.  Patient also complaining of pain in the left hip. This is somewhat chronic, has been previously diagnosed as degenerative changes. She is asking for referral to orthopedics.    Gilda Crease, MD 03/16/13 [phone]

## 2013-03-16 NOTE — ED Notes (Signed)
Pt reports cough, back pain and chest wall pain x 1 month unrelieved after taking OTC medications.

## 2013-03-22 ENCOUNTER — Ambulatory Visit: Payer: Medicaid Other | Admitting: Family Medicine

## 2013-03-29 ENCOUNTER — Ambulatory Visit: Payer: Medicaid Other | Admitting: Family Medicine

## 2013-04-07 ENCOUNTER — Ambulatory Visit: Payer: Medicaid Other | Admitting: Family Medicine

## 2013-04-12 ENCOUNTER — Encounter: Payer: Self-pay | Admitting: Family Medicine

## 2013-04-12 ENCOUNTER — Other Ambulatory Visit (HOSPITAL_COMMUNITY)
Admission: RE | Admit: 2013-04-12 | Discharge: 2013-04-12 | Disposition: A | Discharge status: Home / Self Care | Payer: Medicaid Other | Source: Ambulatory Visit | Attending: Family Medicine | Admitting: Family Medicine

## 2013-04-12 ENCOUNTER — Telehealth: Payer: Self-pay | Admitting: Family Medicine

## 2013-04-12 ENCOUNTER — Ambulatory Visit (INDEPENDENT_AMBULATORY_CARE_PROVIDER_SITE_OTHER): Payer: Medicaid Other | Admitting: Family Medicine

## 2013-04-12 VITALS — BP 125/73 | HR 91 | Temp 98.0°F | Ht 66.0 in | Wt 176.2 lb

## 2013-04-12 DIAGNOSIS — Z01419 Encounter for gynecological examination (general) (routine) without abnormal findings: Secondary | ICD-10-CM | POA: Insufficient documentation

## 2013-04-12 DIAGNOSIS — M25552 Pain in left hip: Secondary | ICD-10-CM

## 2013-04-12 DIAGNOSIS — R87611 Atypical squamous cells cannot exclude high grade squamous intraepithelial lesion on cytologic smear of cervix (ASC-H): Secondary | ICD-10-CM

## 2013-04-12 DIAGNOSIS — Z1239 Encounter for other screening for malignant neoplasm of breast: Secondary | ICD-10-CM | POA: Insufficient documentation

## 2013-04-12 DIAGNOSIS — M545 Low back pain: Secondary | ICD-10-CM

## 2013-04-12 DIAGNOSIS — Z23 Encounter for immunization: Secondary | ICD-10-CM

## 2013-04-12 DIAGNOSIS — M25559 Pain in unspecified hip: Secondary | ICD-10-CM

## 2013-04-12 DIAGNOSIS — Z124 Encounter for screening for malignant neoplasm of cervix: Secondary | ICD-10-CM

## 2013-04-12 DIAGNOSIS — F314 Bipolar disorder, current episode depressed, severe, without psychotic features: Secondary | ICD-10-CM

## 2013-04-12 DIAGNOSIS — F411 Generalized anxiety disorder: Secondary | ICD-10-CM

## 2013-04-12 DIAGNOSIS — Z9189 Other specified personal risk factors, not elsewhere classified: Secondary | ICD-10-CM

## 2013-04-12 DIAGNOSIS — N63 Unspecified lump in unspecified breast: Secondary | ICD-10-CM

## 2013-04-12 MED ORDER — DULOXETINE HCL 30 MG PO CPEP: 30.0000 mg | ORAL_CAPSULE | Freq: Every day | ORAL | Status: DC

## 2013-04-12 NOTE — Patient Instructions (Signed)
For your hip, my nurse, Huntley Dec will work with you on the referral You will need to go to [facility] radiology to get the copy of the disc for the 09/21/2011 MRI. You got a flu shot, tetanus shot and PAP smear today I will call with results.

## 2013-04-13 ENCOUNTER — Encounter: Payer: Self-pay | Admitting: Family Medicine

## 2013-04-13 MED ORDER — BUSPIRONE HCL 30 MG PO TABS: 30.0000 mg | ORAL_TABLET | Freq: Two times a day (BID) | ORAL | Status: DC

## 2013-04-13 NOTE — Assessment & Plan Note (Signed)
No controled substances through this office.  States she will change PCP because of this attitude.

## 2013-04-13 NOTE — Assessment & Plan Note (Signed)
Possible impingement versus chronic pain syndrome versus both. OK ortho referral to further evaluate impingement.

## 2013-04-13 NOTE — Assessment & Plan Note (Signed)
Repeat pap done - normal gross visual of cervix.  S/P leep

## 2013-04-13 NOTE — Progress Notes (Signed)
  Subjective:    Patient ID: Kylie [name], female    DOB: Oct 14, 1970, 42 y.o.   MRN: [id number]  HPI  Joyice Faster comes in her typical bipolar fashion with both anger and requests: 1. She has chronic back and hip pain.  She had an MRI of the hip 1.5 years ago suggesting impingement  Syndrome and now that she is on Medicaid, wants a surgical fix.  She has the name of a surgeon in [facility] and I am happy to give that referral. 2. Social/financial - a bit better.  Approved for disability so on Medicaid.  Only gets $400+ dollars a month so she is chronically poor. 3. Anxiety per her is disabling.  She requests refill on alprazolam.  I refused all controled substances. 4. History of drug abuse.  No controled substances from this office due to multiple well-documented relapses.  She argues that I am being unfair.  I held firm.  She states she will likely seek another PCP. 5. Dysmenorrhea. States she is doubled over in pain for three days with her menses.  Wants hysterectomy.  I am concerned that she once again is looking to surgery to cure what really is a chronic pain syndrome. 6. Had an episode of left breast cellulitis.  Resolved but states she still has a mass in that breast.    Review of Systems     Objective:   Physical Exam Breasts - right normal.  Left some vague fullness in the upper outer quadrent which is where she had the cellulitis.  No discrete mass. Abd benign Pelvic: No vag discharge.  Nl cervix, pap taken,  Bimanual uterus is mildly tender,  Does not feel enlarged although obesity and discomfort limit exam.       Assessment & Plan:

## 2013-04-13 NOTE — Assessment & Plan Note (Signed)
Vague fullness in left breast..  Will get diagnostic mammo.

## 2013-04-13 NOTE — Assessment & Plan Note (Signed)
Part of her chronic pain syndrome

## 2013-04-13 NOTE — Assessment & Plan Note (Addendum)
No alprazolam.  I am willing to prescribe Buspar, which has very little abuse potential.  Refill cymbalta.

## 2013-04-13 NOTE — Assessment & Plan Note (Signed)
Seems stable at her baseline of marginally functional.  No SI or HI

## 2013-04-14 ENCOUNTER — Telehealth: Payer: Self-pay | Admitting: Family Medicine

## 2013-04-14 MED ORDER — DULOXETINE HCL 60 MG PO CPEP: 60.0000 mg | ORAL_CAPSULE | Freq: Every day | ORAL | Status: DC

## 2013-04-14 NOTE — Telephone Encounter (Signed)
Done

## 2013-04-14 NOTE — Telephone Encounter (Signed)
Pt called and wants 60 mg cymbalta. JW

## 2013-04-26 ENCOUNTER — Other Ambulatory Visit: Payer: Medicaid Other

## 2013-04-28 ENCOUNTER — Other Ambulatory Visit: Payer: Medicaid Other

## 2013-06-13 ENCOUNTER — Telehealth: Payer: Self-pay | Admitting: Family Medicine
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
## 2013-06-13 NOTE — Telephone Encounter (Signed)
Pt called and would like Dr. Hensel to call her concerning a personal matter. jw °

## 2013-06-13 NOTE — Telephone Encounter (Signed)
Patient is crying very heavily and when I ask her what is going on she states that "she just really needs to speak to Dr. Andria Frames"

## 2013-07-09 ENCOUNTER — Encounter (HOSPITAL_BASED_OUTPATIENT_CLINIC_OR_DEPARTMENT_OTHER): Payer: Self-pay | Admitting: Emergency Medicine

## 2013-07-09 ENCOUNTER — Emergency Department (HOSPITAL_BASED_OUTPATIENT_CLINIC_OR_DEPARTMENT_OTHER)
Admission: EM | Admit: 2013-07-09 | Discharge: 2013-07-09 | Disposition: A | Discharge status: Home / Self Care | Payer: Medicaid Other | Attending: Emergency Medicine | Admitting: Emergency Medicine

## 2013-07-09 DIAGNOSIS — Z79899 Other long term (current) drug therapy: Secondary | ICD-10-CM | POA: Insufficient documentation

## 2013-07-09 DIAGNOSIS — Z8619 Personal history of other infectious and parasitic diseases: Secondary | ICD-10-CM | POA: Insufficient documentation

## 2013-07-09 DIAGNOSIS — Z87440 Personal history of urinary (tract) infections: Secondary | ICD-10-CM | POA: Insufficient documentation

## 2013-07-09 DIAGNOSIS — Z8541 Personal history of malignant neoplasm of cervix uteri: Secondary | ICD-10-CM | POA: Insufficient documentation

## 2013-07-09 DIAGNOSIS — Z8742 Personal history of other diseases of the female genital tract: Secondary | ICD-10-CM | POA: Insufficient documentation

## 2013-07-09 DIAGNOSIS — G43909 Migraine, unspecified, not intractable, without status migrainosus: Secondary | ICD-10-CM | POA: Insufficient documentation

## 2013-07-09 DIAGNOSIS — F319 Bipolar disorder, unspecified: Secondary | ICD-10-CM | POA: Insufficient documentation

## 2013-07-09 DIAGNOSIS — F17200 Nicotine dependence, unspecified, uncomplicated: Secondary | ICD-10-CM | POA: Insufficient documentation

## 2013-07-09 DIAGNOSIS — F410 Panic disorder [episodic paroxysmal anxiety] without agoraphobia: Secondary | ICD-10-CM | POA: Insufficient documentation

## 2013-07-09 DIAGNOSIS — Z8739 Personal history of other diseases of the musculoskeletal system and connective tissue: Secondary | ICD-10-CM | POA: Insufficient documentation

## 2013-07-09 MED ORDER — ONDANSETRON HCL 4 MG/2ML IJ SOLN: 4.0000 mg | Freq: Once | INTRAMUSCULAR | Status: AC

## 2013-07-09 MED ORDER — ONDANSETRON HCL 4 MG/2ML IJ SOLN
INTRAMUSCULAR | Status: AC
  Administered 2013-07-09: 4 mg via INTRAVENOUS
  Filled 2013-07-09: qty 2

## 2013-07-09 MED ORDER — SODIUM CHLORIDE 0.9 % IV BOLUS (SEPSIS)
1000.0000 mL | Freq: Once | INTRAVENOUS | Status: AC
  Administered 2013-07-09: 500 mL via INTRAVENOUS

## 2013-07-09 MED ORDER — SODIUM CHLORIDE 0.9 % IV BOLUS (SEPSIS)
1000.0000 mL | Freq: Once | INTRAVENOUS | Status: DC
  Administered 2013-07-09: 1000 mL via INTRAVENOUS

## 2013-07-09 MED ORDER — METOCLOPRAMIDE HCL 5 MG/ML IJ SOLN: 10.0000 mg | Freq: Once | INTRAMUSCULAR | Status: DC

## 2013-07-09 MED ORDER — METOCLOPRAMIDE HCL 5 MG/ML IJ SOLN
10.0000 mg | Freq: Once | INTRAMUSCULAR | Status: AC
  Administered 2013-07-09: 10 mg via INTRAVENOUS
  Filled 2013-07-09: qty 2

## 2013-07-09 MED ORDER — HYDROMORPHONE HCL PF 1 MG/ML IJ SOLN
1.0000 mg | Freq: Once | INTRAMUSCULAR | Status: AC
  Administered 2013-07-09: 1 mg via INTRAVENOUS
  Filled 2013-07-09: qty 1

## 2013-07-09 NOTE — ED Notes (Signed)
Patient here with headache x 3 days, reports that the pain is behind both eyes, remote hx of migraines. Nausea, vomiting and photophobia for same. Denies trauma. Alert and oriented

## 2013-07-09 NOTE — ED Notes (Signed)
Dr. Winfred Leeds notified of continued headache, N/V.  He will see pt momentarily.  Pt informed.

## 2013-07-09 NOTE — Discharge Instructions (Signed)
Migraine Headache Call Dr. Andria Frames, see an urgent care center or return if symptoms not well controlled. A migraine headache is an intense, throbbing pain on one or both sides of your head. A migraine can last for 30 minutes to several hours. CAUSES  The exact cause of a migraine headache is not always known. However, a migraine may be caused when nerves in the brain become irritated and release chemicals that cause inflammation. This causes pain. Certain things may also trigger migraines, such as:  Alcohol.  Smoking.  Stress.  Menstruation.  Aged cheeses.  Foods or drinks that contain nitrates, glutamate, aspartame, or tyramine.  Lack of sleep.  Chocolate.  Caffeine.  Hunger.  Physical exertion.  Fatigue.  Medicines used to treat chest pain (nitroglycerine), birth control pills, estrogen, and some blood pressure medicines. SIGNS AND SYMPTOMS  Pain on one or both sides of your head.  Pulsating or throbbing pain.  Severe pain that prevents daily activities.  Pain that is aggravated by any physical activity.  Nausea, vomiting, or both.  Dizziness.  Pain with exposure to bright lights, loud noises, or activity.  General sensitivity to bright lights, loud noises, or smells. Before you get a migraine, you may get warning signs that a migraine is coming (aura). An aura may include:  Seeing flashing lights.  Seeing bright spots, halos, or zig-zag lines.  Having tunnel vision or blurred vision.  Having feelings of numbness or tingling.  Having trouble talking.  Having muscle weakness. DIAGNOSIS  A migraine headache is often diagnosed based on:  Symptoms.  Physical exam.  A CT scan or MRI of your head. These imaging tests cannot diagnose migraines, but they can help rule out other causes of headaches. TREATMENT Medicines may be given for pain and nausea. Medicines can also be given to help prevent recurrent migraines.  HOME CARE INSTRUCTIONS  Only take  over-the-counter or prescription medicines for pain or discomfort as directed by your health care provider. The use of long-term narcotics is not recommended.  Lie down in a dark, quiet room when you have a migraine.  Keep a journal to find out what may trigger your migraine headaches. For example, write down:  What you eat and drink.  How much sleep you get.  Any change to your diet or medicines.  Limit alcohol consumption.  Quit smoking if you smoke.  Get 7 9 hours of sleep, or as recommended by your health care provider.  Limit stress.  Keep lights dim if bright lights bother you and make your migraines worse. SEEK IMMEDIATE MEDICAL CARE IF:   Your migraine becomes severe.  You have a fever.  You have a stiff neck.  You have vision loss.  You have muscular weakness or loss of muscle control.  You start losing your balance or have trouble walking.  You feel faint or pass out.  You have severe symptoms that are different from your first symptoms. MAKE SURE YOU:   Understand these instructions.  Will watch your condition.  Will get help right away if you are not doing well or get worse. Document Released: 05/18/2005 Document Revised: 03/08/2013 Document Reviewed: 01/23/2013 [facility] Patient Information 2014 [place].

## 2013-07-09 NOTE — ED Provider Notes (Signed)
CSN: [id number]     Arrival date & time 07/09/13  4098 History   First MD Initiated Contact with Patient 07/09/13 1052     Chief Complaint  Patient presents with  . Headache   (Consider location/radiation/quality/duration/timing/severity/associated sxs/prior Treatment) HPI Complains of headache gradual onset 3 days ago throbbing in nature located behind both eyes radiating to occiput accompanied by nausea and vomiting feels like migraine she's had in the past treated with Goody powder and Percocet, without relief. She vomited 5 or 6 times since onset of headache. No fever no trauma no other associated symptoms. Past Medical History  Diagnosis Date  . Hepatitis B infection   . Cervical cancer     Stage 2  . Degenerative disk disease   . Ovarian cyst   . Chronic UTI   . Anxiety   . Bipolar 1 disorder   . Panic attack    Past Surgical History  Procedure Laterality Date  . Tubal ligation    . Mandible fracture surgery    . Wisdom tooth extraction     No family history on file. History  Substance Use Topics  . Smoking status: Current Every Day Smoker -- 1.00 packs/day for 25 years    Types: Cigarettes  . Smokeless tobacco: Never Used  . Alcohol Use: No   no illicit drug use OB History   Grav Para Term Preterm Abortions TAB SAB Ect Mult Living                 Review of Systems  Constitutional: Negative.   Respiratory: Negative.   Cardiovascular: Negative.   Gastrointestinal: Positive for nausea and vomiting.  Musculoskeletal: Negative.   Skin: Negative.   Neurological: Positive for headaches.  Psychiatric/Behavioral: Negative.   All other systems reviewed and are negative.    Allergies  Citalopram and Povidone-iodine  Home Medications   Current Outpatient Rx  Name  Route  Sig  Dispense  Refill  . cyclobenzaprine (FLEXERIL) 10 MG tablet   Oral   Take 10 mg by mouth 3 (three) times daily as needed for muscle spasms.         Marland Kitchen albuterol (PROVENTIL HFA;VENTOLIN  HFA) 108 (90 BASE) MCG/ACT inhaler   Inhalation   Inhale 1-2 puffs into the lungs every 6 (six) hours as needed for wheezing.   1 Inhaler   0   . albuterol (PROVENTIL HFA;VENTOLIN HFA) 108 (90 BASE) MCG/ACT inhaler   Inhalation   Inhale 2 puffs into the lungs every 4 (four) hours as needed for wheezing.   1 Inhaler   0   . busPIRone (BUSPAR) 30 MG tablet   Oral   Take 1 tablet (30 mg total) by mouth 2 (two) times daily.   60 tablet   3   . DULoxetine (CYMBALTA) 60 MG capsule   Oral   Take 1 capsule (60 mg total) by mouth daily.   90 capsule   3     Replaces earlier order of 30 mg duloxetine.   . gabapentin (NEURONTIN) 300 MG capsule   Oral   Take 1 capsule (300 mg total) by mouth 3 (three) times daily.   90 capsule   5    BP 111/55  Pulse 85  Temp(Src) 98 F (36.7 C) (Oral)  Resp 21  Ht 5\' 6"  (1.676 m)  Wt 170 lb (77.111 kg)  BMI 27.45 kg/m2  SpO2 97% Physical Exam  Nursing note and vitals reviewed. Constitutional: She is oriented to person, place, and  time. She appears well-developed and well-nourished.  HENT:  Head: Normocephalic and atraumatic.  Eyes: Conjunctivae are normal. Pupils are equal, round, and reactive to light.  Neck: Neck supple. No tracheal deviation present. No thyromegaly present.  Cardiovascular: Normal rate and regular rhythm.   No murmur heard. Pulmonary/Chest: Effort normal and breath sounds normal.  Abdominal: Soft. Bowel sounds are normal. She exhibits no distension. There is no tenderness.  Musculoskeletal: Normal range of motion. She exhibits no edema and no tenderness.  Neurological: She is alert and oriented to person, place, and time. She has normal reflexes. She displays normal reflexes. No cranial nerve deficit. Coordination normal.  DTRs symmetric bilaterally knee jerk ankle jerk biceps toes are bilaterally. Gait normal Romberg normal pronator drift normal  Skin: Skin is warm and dry. No rash noted.  Psychiatric: She has a  normal mood and affect.    ED Course  Procedures (including critical care time) Labs Review Labs Reviewed - No data to display Imaging Review No results found.  EKG Interpretation   None      11:55 AM no relief after treatment with intravenous Reglan and oral Zofran. IV Hydromorphone ordered. 12:30 PM patient feels improved, ready to go home. Nausea and pain improved.  MDM  No diagnosis found. Plan followup with Dr. Andria Frames if significant discomfort tomorrow or see urgent care center or return as needed Diagnosis migraine headache    Orlie Dakin, MD 07/09/13 1234

## 2013-09-05 ENCOUNTER — Emergency Department (HOSPITAL_COMMUNITY): Payer: Medicaid Other

## 2013-09-05 ENCOUNTER — Emergency Department (HOSPITAL_COMMUNITY)
Admission: EM | Admit: 2013-09-05 | Discharge: 2013-09-05 | Disposition: A | Discharge status: Home / Self Care | Payer: Medicaid Other | Attending: Emergency Medicine | Admitting: Emergency Medicine

## 2013-09-05 ENCOUNTER — Encounter (HOSPITAL_COMMUNITY): Payer: Self-pay | Admitting: Emergency Medicine

## 2013-09-05 DIAGNOSIS — Z8619 Personal history of other infectious and parasitic diseases: Secondary | ICD-10-CM | POA: Insufficient documentation

## 2013-09-05 DIAGNOSIS — Z3202 Encounter for pregnancy test, result negative: Secondary | ICD-10-CM | POA: Insufficient documentation

## 2013-09-05 DIAGNOSIS — Z87440 Personal history of urinary (tract) infections: Secondary | ICD-10-CM | POA: Insufficient documentation

## 2013-09-05 DIAGNOSIS — F17200 Nicotine dependence, unspecified, uncomplicated: Secondary | ICD-10-CM | POA: Insufficient documentation

## 2013-09-05 DIAGNOSIS — Z8742 Personal history of other diseases of the female genital tract: Secondary | ICD-10-CM | POA: Insufficient documentation

## 2013-09-05 DIAGNOSIS — Z8541 Personal history of malignant neoplasm of cervix uteri: Secondary | ICD-10-CM | POA: Insufficient documentation

## 2013-09-05 DIAGNOSIS — Z79899 Other long term (current) drug therapy: Secondary | ICD-10-CM | POA: Insufficient documentation

## 2013-09-05 DIAGNOSIS — R072 Precordial pain: Secondary | ICD-10-CM | POA: Insufficient documentation

## 2013-09-05 DIAGNOSIS — IMO0001 Reserved for inherently not codable concepts without codable children: Secondary | ICD-10-CM | POA: Insufficient documentation

## 2013-09-05 DIAGNOSIS — J069 Acute upper respiratory infection, unspecified: Secondary | ICD-10-CM | POA: Insufficient documentation

## 2013-09-05 DIAGNOSIS — F319 Bipolar disorder, unspecified: Secondary | ICD-10-CM | POA: Insufficient documentation

## 2013-09-05 DIAGNOSIS — M791 Myalgia, unspecified site: Secondary | ICD-10-CM

## 2013-09-05 DIAGNOSIS — F410 Panic disorder [episodic paroxysmal anxiety] without agoraphobia: Secondary | ICD-10-CM | POA: Insufficient documentation

## 2013-09-05 DIAGNOSIS — Z8739 Personal history of other diseases of the musculoskeletal system and connective tissue: Secondary | ICD-10-CM | POA: Insufficient documentation

## 2013-09-05 LAB — URINALYSIS, ROUTINE W REFLEX MICROSCOPIC
BILIRUBIN URINE: NEGATIVE
Glucose, UA: NEGATIVE mg/dL
HGB URINE DIPSTICK: NEGATIVE
KETONES UR: NEGATIVE mg/dL
Leukocytes, UA: NEGATIVE
Nitrite: NEGATIVE
Protein, ur: NEGATIVE mg/dL
Specific Gravity, Urine: 1.031 — ABNORMAL HIGH (ref 1.005–1.030)
Urobilinogen, UA: 0.2 mg/dL (ref 0.0–1.0)
pH: 7 (ref 5.0–8.0)

## 2013-09-05 LAB — CBC WITH DIFFERENTIAL/PLATELET
Basophils Absolute: 0 10*3/uL (ref 0.0–0.1)
Basophils Relative: 0 % (ref 0–1)
EOS ABS: 0.1 10*3/uL (ref 0.0–0.7)
Eosinophils Relative: 1 % (ref 0–5)
HCT: 38.1 % (ref 36.0–46.0)
HEMOGLOBIN: 12.9 g/dL (ref 12.0–15.0)
LYMPHS ABS: 2.5 10*3/uL (ref 0.7–4.0)
Lymphocytes Relative: 32 % (ref 12–46)
MCH: 32.1 pg (ref 26.0–34.0)
MCHC: 33.9 g/dL (ref 30.0–36.0)
MCV: 94.8 fL (ref 78.0–100.0)
MONOS PCT: 5 % (ref 3–12)
Monocytes Absolute: 0.4 10*3/uL (ref 0.1–1.0)
NEUTROS ABS: 4.8 10*3/uL (ref 1.7–7.7)
NEUTROS PCT: 62 % (ref 43–77)
PLATELETS: 165 10*3/uL (ref 150–400)
RBC: 4.02 MIL/uL (ref 3.87–5.11)
RDW: 13.4 % (ref 11.5–15.5)
WBC: 7.8 10*3/uL (ref 4.0–10.5)

## 2013-09-05 LAB — COMPREHENSIVE METABOLIC PANEL
ALK PHOS: 55 U/L (ref 39–117)
ALT: 13 U/L (ref 0–35)
AST: 17 U/L (ref 0–37)
Albumin: 3.3 g/dL — ABNORMAL LOW (ref 3.5–5.2)
BUN: 11 mg/dL (ref 6–23)
CHLORIDE: 105 meq/L (ref 96–112)
CO2: 29 mEq/L (ref 19–32)
Calcium: 8.5 mg/dL (ref 8.4–10.5)
Creatinine, Ser: 0.61 mg/dL (ref 0.50–1.10)
GFR calc Af Amer: >90 mL/min (ref >90)
GFR calc non Af Amer: >90 mL/min (ref >90)
GLUCOSE: 90 mg/dL (ref 70–99)
POTASSIUM: 4 meq/L (ref 3.7–5.3)
SODIUM: 144 meq/L (ref 137–147)
TOTAL PROTEIN: 6.5 g/dL (ref 6.0–8.3)

## 2013-09-05 LAB — POC URINE PREG, ED: Preg Test, Ur: NEGATIVE

## 2013-09-05 LAB — LIPASE, BLOOD: Lipase: 12 U/L (ref 11–59)

## 2013-09-05 LAB — CK: Total CK: 89 U/L (ref 7–177)

## 2013-09-05 MED ORDER — SODIUM CHLORIDE 0.9 % IV BOLUS (SEPSIS)
1000.0000 mL | Freq: Once | INTRAVENOUS | Status: AC
  Administered 2013-09-05: 1000 mL via INTRAVENOUS

## 2013-09-05 MED ORDER — KETOROLAC TROMETHAMINE 30 MG/ML IJ SOLN
30.0000 mg | Freq: Once | INTRAMUSCULAR | Status: AC
  Administered 2013-09-05: 30 mg via INTRAVENOUS
  Filled 2013-09-05: qty 1

## 2013-09-05 MED ORDER — SODIUM CHLORIDE 0.9 % IV BOLUS (SEPSIS): 1000.0000 mL | Freq: Once | INTRAVENOUS | Status: DC

## 2013-09-05 MED ORDER — AZITHROMYCIN 250 MG PO TABS: 250.0000 mg | ORAL_TABLET | Freq: Every day | ORAL | Status: DC

## 2013-09-05 MED ORDER — IPRATROPIUM-ALBUTEROL 0.5-2.5 (3) MG/3ML IN SOLN
3.0000 mL | Freq: Once | RESPIRATORY_TRACT | Status: AC
  Administered 2013-09-05: 3 mL via RESPIRATORY_TRACT
  Filled 2013-09-05: qty 3

## 2013-09-05 MED ORDER — HYDROCODONE-ACETAMINOPHEN 5-325 MG PO TABS: 1.0000 | ORAL_TABLET | ORAL | Status: DC | PRN

## 2013-09-05 MED ORDER — MORPHINE SULFATE 4 MG/ML IJ SOLN
4.0000 mg | Freq: Once | INTRAMUSCULAR | Status: AC
  Administered 2013-09-05: 4 mg via INTRAVENOUS
  Filled 2013-09-05: qty 1

## 2013-09-05 MED ORDER — ONDANSETRON HCL 4 MG/2ML IJ SOLN
4.0000 mg | Freq: Once | INTRAMUSCULAR | Status: AC
  Administered 2013-09-05: 4 mg via INTRAVENOUS
  Filled 2013-09-05: qty 2

## 2013-09-05 NOTE — ED Notes (Signed)
Pt made aware of need of urine specimen; pt will attempt at this time to provide a specimen

## 2013-09-05 NOTE — ED Notes (Signed)
PER EMS: Respiratory distress x 8 hours, aches and pains/generalized, musc/skel chest pain, reproducible with palpation, 12 lead unremarkable, patient's lung sounds diminished, particularly on left and at the bases, 5 mg albuterol given PTA, patient sleeping peacefully en route.  Asleep on arrival  Patient states she woke up like this this morning unable to breath, patient seems hoarse, and lethargic. Pt is not tachypnic, normal sinus rhythm, 94% on RA.

## 2013-09-05 NOTE — ED Provider Notes (Addendum)
CSN: [id number]     Arrival date & time 09/05/13  0704 History   First MD Initiated Contact with Patient 09/05/13 [phone]     Chief Complaint  Patient presents with  . Respiratory Distress  . Chest Pain    (Consider location/radiation/quality/duration/timing/severity/associated sxs/prior Treatment) HPI 43 year old female presents with acute onset of dyspnea, sore throat, voice change, and diffuse muscle aches. She states her pain is worse in her right and left lower chest in the midaxillary line. She denies any cough. She states her myalgias started yesterday. She states she saw her PCP and told her about this but no treatment was given. She states this feels just like when she got pneumonia 1 year ago. Patient states she woke up in the middle the night about 6 hours ago with a sore throat, dyspnea, voice change. Denies any trouble swallowing. She states she vomited once and feels nauseous. Denies abdominal pain or midsternal chest pain. No leg swelling, leg pain, hemoptysis, or recent travel. No history of DVT. Her pain is currently a 10. She was given one dose of albuterol by EMS and states her shortness of breath has not improved.  Past Medical History  Diagnosis Date  . Hepatitis B infection   . Cervical cancer     Stage 2  . Degenerative disk disease   . Ovarian cyst   . Chronic UTI   . Anxiety   . Bipolar 1 disorder   . Panic attack    Past Surgical History  Procedure Laterality Date  . Tubal ligation    . Mandible fracture surgery    . Wisdom tooth extraction     No family history on file. History  Substance Use Topics  . Smoking status: Current Every Day Smoker -- 1.00 packs/day for 25 years    Types: Cigarettes  . Smokeless tobacco: Never Used  . Alcohol Use: No   OB History   Grav Para Term Preterm Abortions TAB SAB Ect Mult Living                 Review of Systems  Constitutional: Positive for chills. Negative for fever.  HENT: Positive for sore throat and voice  change. Negative for trouble swallowing.   Respiratory: Positive for shortness of breath. Negative for cough.   Cardiovascular: Positive for chest pain. Negative for leg swelling.  Gastrointestinal: Positive for nausea and vomiting. Negative for abdominal pain.  Genitourinary: Negative for dysuria and menstrual problem (last period 1 week ago).  Musculoskeletal: Positive for myalgias.  All other systems reviewed and are negative.      Allergies  Citalopram and Povidone-iodine  Home Medications   Current Outpatient Rx  Name  Route  Sig  Dispense  Refill  . albuterol (PROVENTIL HFA;VENTOLIN HFA) 108 (90 BASE) MCG/ACT inhaler   Inhalation   Inhale 1-2 puffs into the lungs every 6 (six) hours as needed for wheezing.   1 Inhaler   0   . albuterol (PROVENTIL HFA;VENTOLIN HFA) 108 (90 BASE) MCG/ACT inhaler   Inhalation   Inhale 2 puffs into the lungs every 4 (four) hours as needed for wheezing.   1 Inhaler   0   . busPIRone (BUSPAR) 30 MG tablet   Oral   Take 1 tablet (30 mg total) by mouth 2 (two) times daily.   60 tablet   3   . cyclobenzaprine (FLEXERIL) 10 MG tablet   Oral   Take 10 mg by mouth 3 (three) times daily as needed for  muscle spasms.         . DULoxetine (CYMBALTA) 60 MG capsule   Oral   Take 1 capsule (60 mg total) by mouth daily.   90 capsule   3     Replaces earlier order of 30 mg duloxetine.   . gabapentin (NEURONTIN) 300 MG capsule   Oral   Take 1 capsule (300 mg total) by mouth 3 (three) times daily.   90 capsule   5    BP 105/77  Pulse 80  Temp(Src) 97.5 F (36.4 C) (Oral)  Resp 13  Ht 5\' 6"  (1.676 m)  Wt 188 lb (85.276 kg)  BMI 30.36 kg/m2  SpO2 95%  LMP 08/28/2013 Physical Exam  Nursing note and vitals reviewed. Constitutional: She is oriented to person, place, and time. She appears well-developed and well-nourished. No distress.  HENT:  Head: Normocephalic and atraumatic.  Right Ear: External ear normal.  Left Ear: External  ear normal.  Nose: Nose normal.  Mouth/Throat: Uvula is midline. Mucous membranes are dry. No trismus in the jaw. No uvula swelling. No oropharyngeal exudate, posterior oropharyngeal edema or posterior oropharyngeal erythema.  Eyes: Right eye exhibits no discharge. Left eye exhibits no discharge.  Neck: Normal range of motion. Neck supple.  Cardiovascular: Normal rate, regular rhythm and normal heart sounds.   Pulmonary/Chest: Effort normal. She has decreased breath sounds in the right lower field and the left lower field. She has rales in the right lower field and the left lower field. She exhibits no tenderness.  Abdominal: Soft. She exhibits no distension. There is tenderness in the right upper quadrant.  Musculoskeletal: She exhibits no edema and no tenderness.  Neurological: She is alert and oriented to person, place, and time.  Skin: Skin is warm and dry.    ED Course  Procedures (including critical care time) Labs Review Labs Reviewed  COMPREHENSIVE METABOLIC PANEL - Abnormal; Notable for the following:    Albumin 3.3 (*)    Total Bilirubin <0.2 (*)    All other components within normal limits  URINALYSIS, ROUTINE W REFLEX MICROSCOPIC - Abnormal; Notable for the following:    APPearance HAZY (*)    Specific Gravity, Urine 1.031 (*)    All other components within normal limits  CBC WITH DIFFERENTIAL  LIPASE, BLOOD  CK  POC URINE PREG, ED   Imaging Review Dg Chest 2 View  09/05/2013   CLINICAL DATA:  Cough and short of breath  EXAM: CHEST  2 VIEW  COMPARISON:  03/16/2013  FINDINGS: Heart size upper normal. Mild vascular congestion without edema or effusion. Mild bibasilar atelectasis.  IMPRESSION: Mild vascular congestion without edema.  Mild bibasilar atelectasis.   Electronically Signed   By: Franchot Gallo M.D.   On: 09/05/2013 09:18     EKG Interpretation   Date/Time:  Tuesday September 05 2013 07:13:34 EDT Ventricular Rate:  82 PR Interval:  146 QRS Duration: 96 QT  Interval:  400 QTC Calculation: 467 R Axis:   93 Text Interpretation:  Sinus rhythm Borderline right axis deviation No  significant change since last tracing Confirmed by Leroy  ([id number]) on 09/05/2013 7:20:54 AM      MDM   Final diagnoses:  Upper respiratory infection  Myalgia    Given patient's constellation of symptoms her diagnosis is most likely viral URI. However, given her history of PNA and rales heard on exam, will cover for CAP with azithromycin. She improved with fluids. Myalgias are likely flu-like or viral.  Her "chest pain" does not appear cardiac given benign EKG and infectious symptoms. Due to this I also highly doubt PE as she is low risk and PERC negative. Will treat symptomatically and encourage close PCP follow up. Her voice remained stable in ED, I feel this is due to laryngitis. Her abdominal exam shows mild tenderness but given her respiratory symptoms I have low suspicion for a concomitant acute abdominal process.     Ephraim Hamburger, MD 09/05/13 1724  Ephraim Hamburger, MD 09/05/13 1725

## 2013-09-05 NOTE — ED Notes (Signed)
RT attempting artieal stick. Unable to obtain blood after 2 attempts. Dr. Cleda Clarks made aware, ultrasound at bedside

## 2013-09-05 NOTE — ED Notes (Signed)
Dr. Verta Ellen at bedside.

## 2013-09-05 NOTE — ED Notes (Signed)
Unable to get labs, Dr. Regenia Skeeter made aware, Tressia Miners Rn attempting at this time

## 2013-10-27 ENCOUNTER — Emergency Department (HOSPITAL_BASED_OUTPATIENT_CLINIC_OR_DEPARTMENT_OTHER)
Admission: EM | Admit: 2013-10-27 | Discharge: 2013-10-28 | Disposition: A | Discharge status: Home / Self Care | Payer: Medicaid Other | Attending: Emergency Medicine | Admitting: Emergency Medicine

## 2013-10-27 ENCOUNTER — Encounter (HOSPITAL_BASED_OUTPATIENT_CLINIC_OR_DEPARTMENT_OTHER): Payer: Self-pay | Admitting: Emergency Medicine

## 2013-10-27 ENCOUNTER — Other Ambulatory Visit: Payer: Self-pay | Admitting: *Deleted

## 2013-10-27 ENCOUNTER — Emergency Department (HOSPITAL_BASED_OUTPATIENT_CLINIC_OR_DEPARTMENT_OTHER): Payer: Medicaid Other

## 2013-10-27 DIAGNOSIS — F17200 Nicotine dependence, unspecified, uncomplicated: Secondary | ICD-10-CM | POA: Insufficient documentation

## 2013-10-27 DIAGNOSIS — Y9389 Activity, other specified: Secondary | ICD-10-CM | POA: Insufficient documentation

## 2013-10-27 DIAGNOSIS — M1990 Unspecified osteoarthritis, unspecified site: Secondary | ICD-10-CM | POA: Insufficient documentation

## 2013-10-27 DIAGNOSIS — Z8742 Personal history of other diseases of the female genital tract: Secondary | ICD-10-CM | POA: Insufficient documentation

## 2013-10-27 DIAGNOSIS — Z792 Long term (current) use of antibiotics: Secondary | ICD-10-CM | POA: Diagnosis not present

## 2013-10-27 DIAGNOSIS — S7000XA Contusion of unspecified hip, initial encounter: Secondary | ICD-10-CM | POA: Insufficient documentation

## 2013-10-27 DIAGNOSIS — F410 Panic disorder [episodic paroxysmal anxiety] without agoraphobia: Secondary | ICD-10-CM | POA: Diagnosis not present

## 2013-10-27 DIAGNOSIS — S79929A Unspecified injury of unspecified thigh, initial encounter: Secondary | ICD-10-CM | POA: Diagnosis present

## 2013-10-27 DIAGNOSIS — S79919A Unspecified injury of unspecified hip, initial encounter: Secondary | ICD-10-CM | POA: Diagnosis present

## 2013-10-27 DIAGNOSIS — F319 Bipolar disorder, unspecified: Secondary | ICD-10-CM | POA: Insufficient documentation

## 2013-10-27 DIAGNOSIS — T148XXA Other injury of unspecified body region, initial encounter: Secondary | ICD-10-CM

## 2013-10-27 DIAGNOSIS — Y92410 Unspecified street and highway as the place of occurrence of the external cause: Secondary | ICD-10-CM | POA: Diagnosis not present

## 2013-10-27 DIAGNOSIS — Z8541 Personal history of malignant neoplasm of cervix uteri: Secondary | ICD-10-CM | POA: Diagnosis not present

## 2013-10-27 DIAGNOSIS — Z87440 Personal history of urinary (tract) infections: Secondary | ICD-10-CM | POA: Insufficient documentation

## 2013-10-27 DIAGNOSIS — Z8619 Personal history of other infectious and parasitic diseases: Secondary | ICD-10-CM | POA: Diagnosis not present

## 2013-10-27 DIAGNOSIS — Z79899 Other long term (current) drug therapy: Secondary | ICD-10-CM | POA: Diagnosis not present

## 2013-10-27 MED ORDER — FENTANYL CITRATE 0.05 MG/ML IJ SOLN
50.0000 ug | Freq: Once | INTRAMUSCULAR | Status: AC
  Administered 2013-10-27: 50 ug via INTRAMUSCULAR
  Filled 2013-10-27: qty 2
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
## 2013-10-28 MED ORDER — IBUPROFEN 600 MG PO TABS: 600.0000 mg | ORAL_TABLET | Freq: Four times a day (QID) | ORAL | Status: DC | PRN

## 2013-10-28 MED ORDER — HYDROCODONE-ACETAMINOPHEN 5-325 MG PO TABS: 1.0000 | ORAL_TABLET | Freq: Four times a day (QID) | ORAL | Status: DC | PRN
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
## 2013-10-30 MED ORDER — CYCLOBENZAPRINE HCL 10 MG PO TABS: 10.0000 mg | ORAL_TABLET | Freq: Three times a day (TID) | ORAL | Status: DC | PRN

## 2013-10-30 NOTE — Telephone Encounter (Signed)
I will reorder the flexeril.  I have taken hydrocodone and alprazolam off her med list.  She will not be prescribed controled substances through this office.

## 2013-11-08 ENCOUNTER — Ambulatory Visit (HOSPITAL_COMMUNITY): Payer: Medicaid Other | Admitting: Psychiatry

## 2013-11-13 ENCOUNTER — Ambulatory Visit (HOSPITAL_COMMUNITY): Payer: Medicaid Other | Admitting: Psychiatry

## 2013-11-16 ENCOUNTER — Ambulatory Visit (INDEPENDENT_AMBULATORY_CARE_PROVIDER_SITE_OTHER): Payer: Federal, State, Local not specified - Other | Admitting: Psychiatry

## 2013-11-16 ENCOUNTER — Ambulatory Visit (HOSPITAL_COMMUNITY): Payer: Federal, State, Local not specified - Other | Admitting: Psychiatry

## 2013-11-16 VITALS — Ht 66.0 in | Wt 182.0 lb

## 2013-11-16 DIAGNOSIS — F319 Bipolar disorder, unspecified: Secondary | ICD-10-CM

## 2013-11-16 DIAGNOSIS — F411 Generalized anxiety disorder: Secondary | ICD-10-CM

## 2013-11-16 DIAGNOSIS — F3130 Bipolar disorder, current episode depressed, mild or moderate severity, unspecified: Secondary | ICD-10-CM

## 2013-11-16 MED ORDER — ALPRAZOLAM 1 MG PO TABS: 1.0000 mg | ORAL_TABLET | Freq: Every day | ORAL | Status: DC
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
## 2013-11-23 ENCOUNTER — Ambulatory Visit (HOSPITAL_COMMUNITY): Payer: Medicaid Other | Admitting: Psychiatry

## 2014-01-29 ENCOUNTER — Other Ambulatory Visit: Payer: Self-pay | Admitting: *Deleted

## 2014-01-29 MED ORDER — GABAPENTIN 300 MG PO CAPS: 300.0000 mg | ORAL_CAPSULE | Freq: Three times a day (TID) | ORAL | Status: DC

## 2014-03-05 ENCOUNTER — Emergency Department (HOSPITAL_BASED_OUTPATIENT_CLINIC_OR_DEPARTMENT_OTHER)
Admission: EM | Admit: 2014-03-05 | Discharge: 2014-03-06 | Disposition: A | Discharge status: Home / Self Care | Payer: Medicaid Other | Attending: Emergency Medicine | Admitting: Emergency Medicine

## 2014-03-05 ENCOUNTER — Encounter (HOSPITAL_BASED_OUTPATIENT_CLINIC_OR_DEPARTMENT_OTHER): Payer: Self-pay | Admitting: Emergency Medicine

## 2014-03-05 DIAGNOSIS — Z792 Long term (current) use of antibiotics: Secondary | ICD-10-CM | POA: Diagnosis not present

## 2014-03-05 DIAGNOSIS — F1310 Sedative, hypnotic or anxiolytic abuse, uncomplicated: Secondary | ICD-10-CM | POA: Insufficient documentation

## 2014-03-05 DIAGNOSIS — F319 Bipolar disorder, unspecified: Secondary | ICD-10-CM | POA: Diagnosis not present

## 2014-03-05 DIAGNOSIS — Z8541 Personal history of malignant neoplasm of cervix uteri: Secondary | ICD-10-CM | POA: Insufficient documentation

## 2014-03-05 DIAGNOSIS — N390 Urinary tract infection, site not specified: Secondary | ICD-10-CM | POA: Insufficient documentation

## 2014-03-05 DIAGNOSIS — Z8742 Personal history of other diseases of the female genital tract: Secondary | ICD-10-CM | POA: Diagnosis not present

## 2014-03-05 DIAGNOSIS — R109 Unspecified abdominal pain: Secondary | ICD-10-CM | POA: Diagnosis present

## 2014-03-05 DIAGNOSIS — Z765 Malingerer [conscious simulation]: Secondary | ICD-10-CM

## 2014-03-05 DIAGNOSIS — F410 Panic disorder [episodic paroxysmal anxiety] without agoraphobia: Secondary | ICD-10-CM | POA: Insufficient documentation

## 2014-03-05 DIAGNOSIS — Z79899 Other long term (current) drug therapy: Secondary | ICD-10-CM | POA: Diagnosis not present

## 2014-03-05 DIAGNOSIS — Z8739 Personal history of other diseases of the musculoskeletal system and connective tissue: Secondary | ICD-10-CM | POA: Insufficient documentation

## 2014-03-05 DIAGNOSIS — Z8619 Personal history of other infectious and parasitic diseases: Secondary | ICD-10-CM | POA: Insufficient documentation

## 2014-03-05 DIAGNOSIS — Z720 Tobacco use: Secondary | ICD-10-CM | POA: Insufficient documentation

## 2014-03-05 NOTE — ED Notes (Signed)
Pt brought in by EMS for bil lower back pain x 2 weeks. Pt is unable to keep eyes open in triage,  Amb Stumbling with eyes close. Pt denies having taken any narcotics tonight.

## 2014-03-06 ENCOUNTER — Encounter (HOSPITAL_BASED_OUTPATIENT_CLINIC_OR_DEPARTMENT_OTHER): Payer: Self-pay | Admitting: Emergency Medicine

## 2014-03-06 ENCOUNTER — Emergency Department (HOSPITAL_BASED_OUTPATIENT_CLINIC_OR_DEPARTMENT_OTHER): Payer: Medicaid Other

## 2014-03-06 LAB — URINALYSIS, ROUTINE W REFLEX MICROSCOPIC
Bilirubin Urine: NEGATIVE
GLUCOSE, UA: NEGATIVE mg/dL
Ketones, ur: 15 mg/dL — AB
NITRITE: POSITIVE — AB
PH: 5.5 (ref 5.0–8.0)
Protein, ur: NEGATIVE mg/dL
SPECIFIC GRAVITY, URINE: 1.022 (ref 1.005–1.030)
Urobilinogen, UA: 1 mg/dL (ref 0.0–1.0)

## 2014-03-06 LAB — RAPID URINE DRUG SCREEN, HOSP PERFORMED
Amphetamines: NOT DETECTED
BARBITURATES: NOT DETECTED
Benzodiazepines: POSITIVE — AB
COCAINE: NOT DETECTED
OPIATES: NOT DETECTED
Tetrahydrocannabinol: NOT DETECTED

## 2014-03-06 LAB — URINE MICROSCOPIC-ADD ON

## 2014-03-06 LAB — PREGNANCY, URINE: PREG TEST UR: NEGATIVE

## 2014-03-06 MED ORDER — KETOROLAC TROMETHAMINE 60 MG/2ML IM SOLN
60.0000 mg | Freq: Once | INTRAMUSCULAR | Status: AC
  Administered 2014-03-06: 60 mg via INTRAMUSCULAR
  Filled 2014-03-06: qty 2

## 2014-03-06 MED ORDER — NITROFURANTOIN MONOHYD MACRO 100 MG PO CAPS
100.0000 mg | ORAL_CAPSULE | Freq: Once | ORAL | Status: AC
  Administered 2014-03-06: 100 mg via ORAL
  Filled 2014-03-06: qty 1

## 2014-03-06 MED ORDER — PHENAZOPYRIDINE HCL 100 MG PO TABS
200.0000 mg | ORAL_TABLET | Freq: Once | ORAL | Status: AC
  Administered 2014-03-06: 200 mg via ORAL
  Filled 2014-03-06: qty 2

## 2014-03-06 MED ORDER — NAPROXEN 375 MG PO TABS: 375.0000 mg | ORAL_TABLET | Freq: Two times a day (BID) | ORAL | Status: DC

## 2014-03-06 MED ORDER — NITROFURANTOIN MONOHYD MACRO 100 MG PO CAPS: 100.0000 mg | ORAL_CAPSULE | Freq: Two times a day (BID) | ORAL | Status: DC

## 2014-03-06 MED ORDER — PHENAZOPYRIDINE HCL 200 MG PO TABS: 200.0000 mg | ORAL_TABLET | Freq: Three times a day (TID) | ORAL | Status: DC

## 2014-03-06 NOTE — ED Notes (Signed)
Pt rang out with call bell.  Primary RN and Agricultural consultant went to room.  Pt asking again how long until she will be seen.  Asking for pain med for her "kidney stones".  Pt sts that if she had known how long it would be she would have gone to [place].  Sts "This is the worst ER I have ever been in."  Charge nurse apologized for the wait and told pt that it should not be much longer.

## 2014-03-06 NOTE — ED Notes (Signed)
Pt threw her discharge papers across the room.  Called the MD a "F**kin B**ch. States she is getting a Chief Executive Officer.  Security had to escort pt from her room to the Dc area.

## 2014-03-06 NOTE — ED Notes (Signed)
Pt sts that if she has to wait much longer she is going to [facility] because "I can get in and out of there in an hour and 15 minutes." MD notified.

## 2014-03-06 NOTE — ED Notes (Signed)
Pt came out of room and went into nourishment refrigerator and took out two cokes without asking permission of anyone.

## 2014-03-06 NOTE — ED Notes (Signed)
Pt now has slurred speech and unsteady gait, pin point pupils. Pt has been back and forth to restroom with no problems, pt denies taken any drugs; pt c/o dry mouth, gingerale given, EDP notified

## 2014-03-06 NOTE — ED Notes (Signed)
Pt refused to go to CT with the CT tech "until I get some pain medicine".  Charge nurse went to room and told pt that she could not get any narcotic pain medicine because she is unsteady on her feet and her speech is slurred.  Pt became angry and told charge nurse that if her speech is slurred it is because she hasn't slept for a week.  Pt asked "Who says I'm not steady on my feet"?  Pt got up out of bed and walked perfectly normally and strong.  Pts speech became normal.  Pt cursing as she was going down the hall that her speech isn't slurred and "they're F**king crazy".  Pt walked down the hall to CT with no difficulty.

## 2014-03-06 NOTE — ED Notes (Signed)
MD at bedside. 

## 2014-03-06 NOTE — ED Notes (Addendum)
Pt up to bathroom.  Then called out again to find out how much longer it will be.  Pt arguing with charge nurse about how long it has been.  Pt sts she is going to call the [facility] and let them know what is going on over here.  Charge nurse apologized again for the wait.

## 2014-03-06 NOTE — ED Provider Notes (Addendum)
CSN: [id number]     Arrival date & time 03/05/14  2138 History   First MD Initiated Contact with Patient 03/06/14 0109     Chief Complaint  Patient presents with  . Back Pain     (Consider location/radiation/quality/duration/timing/severity/associated sxs/prior Treatment) Patient is a 43 y.o. female presenting with flank pain. The history is provided by the patient and medical records.  Flank Pain This is a new (bilateral) problem. The current episode started more than 1 week ago. The problem occurs constantly. The problem has not changed since onset.Pertinent negatives include no chest pain, no headaches and no shortness of breath. Nothing aggravates the symptoms. Nothing relieves the symptoms. Treatments tried: percocet 10s and xanax. The treatment provided no relief.  Per care everywhere was seen at [facility] this am and xanax was refilled and outpatient CT was ordered by she called EMS for transport as she could not wait.  She states she was never called to set up outpatient CT.  She states her pain 10/10 and is out of control after taking 4# 10 mg percocets that she has at home and multiple xanax's but still no relief.    Past Medical History  Diagnosis Date  . Hepatitis B infection   . Cervical cancer     Stage 2  . Degenerative disk disease   . Ovarian cyst   . Chronic UTI   . Anxiety   . Bipolar 1 disorder   . Panic attack    Past Surgical History  Procedure Laterality Date  . Tubal ligation    . Mandible fracture surgery    . Wisdom tooth extraction     History reviewed. No pertinent family history. History  Substance Use Topics  . Smoking status: Current Every Day Smoker -- 1.00 packs/day for 25 years    Types: Cigarettes  . Smokeless tobacco: Never Used  . Alcohol Use: No   OB History   Grav Para Term Preterm Abortions TAB SAB Ect Mult Living                 Review of Systems  Respiratory: Negative for shortness of breath.   Cardiovascular: Negative for  chest pain.  Genitourinary: Positive for flank pain.  Neurological: Negative for headaches.  All other systems reviewed and are negative.     Allergies  Citalopram and Povidone-iodine  Home Medications   Prior to Admission medications   Medication Sig Start Date End Date Taking? Authorizing Provider  albuterol (PROVENTIL HFA;VENTOLIN HFA) 108 (90 BASE) MCG/ACT inhaler Inhale 2 puffs into the lungs every 4 (four) hours as needed for wheezing. 03/16/13   Orpah Greek, MD  ALPRAZolam Duanne Moron) 1 MG tablet Take 1 tablet (1 mg total) by mouth at bedtime. 11/16/13 11/16/14  Merian Capron, MD  azithromycin (ZITHROMAX) 250 MG tablet Take 1 tablet (250 mg total) by mouth daily. Take first 2 tablets together, then 1 every day until finished. 09/05/13   Ephraim Hamburger, MD  busPIRone (BUSPAR) 30 MG tablet Take 30 mg by mouth daily.    Historical Provider, MD  cyclobenzaprine (FLEXERIL) 10 MG tablet Take 1 tablet (10 mg total) by mouth 3 (three) times daily as needed for muscle spasms.    Zigmund Gottron, MD  DULoxetine (CYMBALTA) 60 MG capsule Take 1 capsule (60 mg total) by mouth daily. 04/14/13   Zigmund Gottron, MD  gabapentin (NEURONTIN) 300 MG capsule Take 1 capsule (300 mg total) by mouth 3 (three) times daily. 01/29/14  Zigmund Gottron, MD  ibuprofen (ADVIL,MOTRIN) 600 MG tablet Take 1 tablet (600 mg total) by mouth every 6 (six) hours as needed. 10/28/13   Ankit Kathrynn Humble, MD   BP 110/69  Pulse 94  Temp(Src) 98.3 F (36.8 C) (Oral)  Resp 22  Ht 5\' 6"  (1.676 m)  Wt 202 lb (91.627 kg)  BMI 32.62 kg/m2  SpO2 98%  LMP 02/27/2014 Physical Exam  Constitutional: She is oriented to person, place, and time. She appears well-developed and well-nourished. She does not appear ill. No distress.  Slurred speech, having a hard time keeping eyes open   HENT:  Head: Normocephalic and atraumatic.  Mouth/Throat: Oropharynx is clear and moist.  Eyes: Conjunctivae and EOM are  normal.  Pinpoint pupils  Neck: Normal range of motion. Neck supple. No tracheal deviation present.  Cardiovascular: Normal rate, regular rhythm and intact distal pulses.   Pulmonary/Chest: Effort normal and breath sounds normal. No stridor. She has no wheezes. She has no rales.  Abdominal: Soft. Bowel sounds are normal. There is no tenderness. There is no rebound and no guarding.  Musculoskeletal: Normal range of motion.  Lymphadenopathy:    She has no cervical adenopathy.  Neurological: She is alert and oriented to person, place, and time.  Skin: Skin is warm and dry.  Psychiatric: She is aggressive.  Angry and demanding    ED Course  Procedures (including critical care time) Labs Review Labs Reviewed  URINALYSIS, ROUTINE W REFLEX MICROSCOPIC - Abnormal; Notable for the following:    Color, Urine ORANGE (*)    APPearance CLOUDY (*)    Hgb urine dipstick LARGE (*)    Ketones, ur 15 (*)    Nitrite POSITIVE (*)    Leukocytes, UA TRACE (*)    All other components within normal limits  URINE RAPID DRUG SCREEN (HOSP PERFORMED) - Abnormal; Notable for the following:    Benzodiazepines POSITIVE (*)    All other components within normal limits  URINE MICROSCOPIC-ADD ON - Abnormal; Notable for the following:    Bacteria, UA FEW (*)    All other components within normal limits  PREGNANCY, URINE    Imaging Review No results found.   EKG Interpretation None      MDM  Records in care everywhere reviewed Final diagnoses:  None   Patient can barely stay awake to answer questions or comply with exam.  EDP expressed concern that the patient was staggering and somnolent and appeared intoxicated on a substance. Patient seen at her PMD earlier today.  Admits to taking Percocet 10s and xanax prior to arrival both of which are not helping the pain.  In light of fact that the patient admits to taking 4 percocet EDP feels it is unsafe, as when she gets up she is unable to stand up straight.   EDP has informed the patient of this.  I do not believe her exam nor vitals are consistent with 10/10 pain. She was ordered toradol as this a first line treatment for kidney stones.    She is verbally assaultive to staff because she is not getting the medication she wants.    She is belligerent and demanding narcotics.  She has pinpoint pupils and is barely able to stay awake on exam.  I suspect she is taking narcotics.  We will treat the patient for UTI.  She does not have a kidney stone and given her substance abuse history I do not believe narcotics are in her best interest.  She has no stones, nor hydronephrosis nor hydroureters nor bladder.  CT is unremarkable.  She will be prescribed pyridium, naproxen, and macrobid.  Have advised close follow up with PMD.    She threw her discharge papers and prescriptions as EDP attempted to speak with the patient   Iyla Balzarini K Maki Sweetser-Rasch, MD 03/06/14 0314  Tishie Altmann K Shmiel Morton-Rasch, MD 03/06/14 0825  Shakerria Parran K Albina Gosney-Rasch, MD 03/06/14 0539

## 2014-04-04 ENCOUNTER — Emergency Department (HOSPITAL_COMMUNITY): Payer: Medicaid Other

## 2014-04-04 ENCOUNTER — Emergency Department (HOSPITAL_COMMUNITY)
Admission: EM | Admit: 2014-04-04 | Discharge: 2014-04-06 | Disposition: A | Discharge status: Other Acute Inpatient Hospital | Payer: Medicaid Other | Attending: Emergency Medicine | Admitting: Emergency Medicine

## 2014-04-04 ENCOUNTER — Encounter (HOSPITAL_COMMUNITY): Payer: Self-pay | Admitting: Emergency Medicine

## 2014-04-04 DIAGNOSIS — F410 Panic disorder [episodic paroxysmal anxiety] without agoraphobia: Secondary | ICD-10-CM | POA: Insufficient documentation

## 2014-04-04 DIAGNOSIS — T4272XA Poisoning by unspecified antiepileptic and sedative-hypnotic drugs, intentional self-harm, initial encounter: Secondary | ICD-10-CM | POA: Diagnosis not present

## 2014-04-04 DIAGNOSIS — Z79899 Other long term (current) drug therapy: Secondary | ICD-10-CM | POA: Insufficient documentation

## 2014-04-04 DIAGNOSIS — F332 Major depressive disorder, recurrent severe without psychotic features: Secondary | ICD-10-CM | POA: Diagnosis not present

## 2014-04-04 DIAGNOSIS — S61511A Laceration without foreign body of right wrist, initial encounter: Secondary | ICD-10-CM | POA: Diagnosis not present

## 2014-04-04 DIAGNOSIS — S1181XA Laceration without foreign body of other specified part of neck, initial encounter: Secondary | ICD-10-CM | POA: Diagnosis not present

## 2014-04-04 DIAGNOSIS — M519 Unspecified thoracic, thoracolumbar and lumbosacral intervertebral disc disorder: Secondary | ICD-10-CM | POA: Diagnosis not present

## 2014-04-04 DIAGNOSIS — S61512A Laceration without foreign body of left wrist, initial encounter: Secondary | ICD-10-CM | POA: Insufficient documentation

## 2014-04-04 DIAGNOSIS — Y9289 Other specified places as the place of occurrence of the external cause: Secondary | ICD-10-CM | POA: Diagnosis not present

## 2014-04-04 DIAGNOSIS — X781XXA Intentional self-harm by knife, initial encounter: Secondary | ICD-10-CM | POA: Insufficient documentation

## 2014-04-04 DIAGNOSIS — Z791 Long term (current) use of non-steroidal anti-inflammatories (NSAID): Secondary | ICD-10-CM | POA: Insufficient documentation

## 2014-04-04 DIAGNOSIS — R45851 Suicidal ideations: Secondary | ICD-10-CM

## 2014-04-04 DIAGNOSIS — Z792 Long term (current) use of antibiotics: Secondary | ICD-10-CM | POA: Diagnosis not present

## 2014-04-04 DIAGNOSIS — Z8619 Personal history of other infectious and parasitic diseases: Secondary | ICD-10-CM | POA: Insufficient documentation

## 2014-04-04 DIAGNOSIS — Z720 Tobacco use: Secondary | ICD-10-CM | POA: Diagnosis not present

## 2014-04-04 DIAGNOSIS — Z8541 Personal history of malignant neoplasm of cervix uteri: Secondary | ICD-10-CM | POA: Insufficient documentation

## 2014-04-04 DIAGNOSIS — Z3202 Encounter for pregnancy test, result negative: Secondary | ICD-10-CM | POA: Diagnosis not present

## 2014-04-04 DIAGNOSIS — T50901A Poisoning by unspecified drugs, medicaments and biological substances, accidental (unintentional), initial encounter: Secondary | ICD-10-CM

## 2014-04-04 DIAGNOSIS — T424X2A Poisoning by benzodiazepines, intentional self-harm, initial encounter: Secondary | ICD-10-CM | POA: Diagnosis not present

## 2014-04-04 DIAGNOSIS — T1491XA Suicide attempt, initial encounter: Secondary | ICD-10-CM

## 2014-04-04 DIAGNOSIS — Z8742 Personal history of other diseases of the female genital tract: Secondary | ICD-10-CM | POA: Insufficient documentation

## 2014-04-04 DIAGNOSIS — Y9389 Activity, other specified: Secondary | ICD-10-CM | POA: Diagnosis not present

## 2014-04-04 LAB — COMPREHENSIVE METABOLIC PANEL
ALK PHOS: 70 U/L (ref 39–117)
ALT: 55 U/L — AB (ref 0–35)
AST: 32 U/L (ref 0–37)
Albumin: 3.5 g/dL (ref 3.5–5.2)
Anion gap: 14 (ref 5–15)
BILIRUBIN TOTAL: 0.4 mg/dL (ref 0.3–1.2)
BUN: 7 mg/dL (ref 6–23)
CHLORIDE: 103 meq/L (ref 96–112)
CO2: 23 mEq/L (ref 19–32)
Calcium: 9.1 mg/dL (ref 8.4–10.5)
Creatinine, Ser: 0.67 mg/dL (ref 0.50–1.10)
GLUCOSE: 102 mg/dL — AB (ref 70–99)
POTASSIUM: 3.4 meq/L — AB (ref 3.7–5.3)
Sodium: 140 mEq/L (ref 137–147)
Total Protein: 7.2 g/dL (ref 6.0–8.3)

## 2014-04-04 LAB — CBC
HCT: 36.7 % (ref 36.0–46.0)
HEMOGLOBIN: 12.2 g/dL (ref 12.0–15.0)
MCH: 31.9 pg (ref 26.0–34.0)
MCHC: 33.2 g/dL (ref 30.0–36.0)
MCV: 95.8 fL (ref 78.0–100.0)
Platelets: 207 10*3/uL (ref 150–400)
RBC: 3.83 MIL/uL — ABNORMAL LOW (ref 3.87–5.11)
RDW: 14.1 % (ref 11.5–15.5)
WBC: 7.4 10*3/uL (ref 4.0–10.5)

## 2014-04-04 LAB — RAPID URINE DRUG SCREEN, HOSP PERFORMED
AMPHETAMINES: NOT DETECTED
BARBITURATES: NOT DETECTED
BENZODIAZEPINES: POSITIVE — AB
COCAINE: POSITIVE — AB
Opiates: POSITIVE — AB
Tetrahydrocannabinol: NOT DETECTED

## 2014-04-04 LAB — ACETAMINOPHEN LEVEL: Acetaminophen (Tylenol), Serum: <15 ug/mL (ref 10–30)

## 2014-04-04 LAB — SALICYLATE LEVEL: Salicylate Lvl: <2 mg/dL — ABNORMAL LOW (ref 2.8–20.0)

## 2014-04-04 LAB — ETHANOL: Alcohol, Ethyl (B): 88 mg/dL — ABNORMAL HIGH (ref 0–11)

## 2014-04-04 NOTE — ED Notes (Signed)
Staffing office states no sitter available at this time
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
## 2014-04-04 NOTE — ED Provider Notes (Signed)
CSN: [id number]     Arrival date & time 04/04/14  2251 History   First MD Initiated Contact with Patient 04/04/14 2305     Chief Complaint  Patient presents with  . Drug Overdose  . Suicide Attempt     (Consider location/radiation/quality/duration/timing/severity/associated sxs/prior Treatment) HPI Kylie [name] is a 43 year old female past medical history of hepatitis B, anxiety, bipolar disorder, low back pain, drug abuse who presents to the ER after a suicide attempt. Patient was brought in by EMS reports the patient took "a bottle of temazepam" along with reportedly a regular dose of gabapentin.patient also attempted cutting her wrists and neck.  Noted by [facility] Department that patient had a bottle of temazepam at the house which was filled on 04/02/14 and empty tonight. During interview patient is drowsy, arousable with voice, however quickly goes back to sleep. Patient can answer yes/no questions somewhat appropriately, and reports taking temazepam, gabapentin, and cutting her wrists and neck with a kitchen knife.patient had empty bottle of temazepam with prescription for 30 pills. Patient reporting to EMS that she took 2 pills as directed, and "the rest tonight".  Past Medical History  Diagnosis Date  . Hepatitis B infection   . Cervical cancer     Stage 2  . Degenerative disk disease   . Ovarian cyst   . Chronic UTI   . Anxiety   . Bipolar 1 disorder   . Panic attack    Past Surgical History  Procedure Laterality Date  . Tubal ligation    . Mandible fracture surgery    . Wisdom tooth extraction     No family history on file. History  Substance Use Topics  . Smoking status: Current Every Day Smoker -- 1.00 packs/day for 25 years    Types: Cigarettes  . Smokeless tobacco: Never Used  . Alcohol Use: No   OB History    No data available     Review of Systems  Unable to perform ROS: Mental status change      Allergies  Citalopram; Naproxen; and  Povidone-iodine  Home Medications   Prior to Admission medications   Medication Sig Start Date End Date Taking? Authorizing Provider  albuterol (PROVENTIL HFA;VENTOLIN HFA) 108 (90 BASE) MCG/ACT inhaler Inhale 2 puffs into the lungs every 4 (four) hours as needed for wheezing. 03/16/13   Orpah Greek, MD  ALPRAZolam Duanne Moron) 1 MG tablet Take 1 tablet (1 mg total) by mouth at bedtime. 11/16/13 11/16/14  Merian Capron, MD  azithromycin (ZITHROMAX) 250 MG tablet Take 1 tablet (250 mg total) by mouth daily. Take first 2 tablets together, then 1 every day until finished. 09/05/13   Ephraim Hamburger, MD  busPIRone (BUSPAR) 30 MG tablet Take 30 mg by mouth daily.    Historical Provider, MD  cyclobenzaprine (FLEXERIL) 10 MG tablet Take 1 tablet (10 mg total) by mouth 3 (three) times daily as needed for muscle spasms.    Zigmund Gottron, MD  DULoxetine (CYMBALTA) 60 MG capsule Take 1 capsule (60 mg total) by mouth daily. 04/14/13   Zigmund Gottron, MD  gabapentin (NEURONTIN) 300 MG capsule Take 1 capsule (300 mg total) by mouth 3 (three) times daily. 01/29/14   Zigmund Gottron, MD  ibuprofen (ADVIL,MOTRIN) 600 MG tablet Take 1 tablet (600 mg total) by mouth every 6 (six) hours as needed. 10/28/13   Varney Biles, MD  naproxen (NAPROSYN) 375 MG tablet Take 1 tablet (375 mg total) by mouth 2 (two) times  daily with a meal. 03/06/14   April K Palumbo-Rasch, MD  nitrofurantoin, macrocrystal-monohydrate, (MACROBID) 100 MG capsule Take 1 capsule (100 mg total) by mouth 2 (two) times daily. X 7 days 03/06/14   April K Palumbo-Rasch, MD  phenazopyridine (PYRIDIUM) 200 MG tablet Take 1 tablet (200 mg total) by mouth 3 (three) times daily. 03/06/14   April K Palumbo-Rasch, MD   BP 98/62 mmHg  Pulse 88  Temp(Src) 97.9 F (36.6 C) (Oral)  Resp 22  SpO2 96%  LMP 04/04/2014 Physical Exam  Constitutional: She appears well-developed and well-nourished. She appears lethargic.  Listless,  drowsy-appearing female with obvious abrasions to wrists and neck.  HENT:  Head: Normocephalic and atraumatic.  Mouth/Throat: Oropharynx is clear and moist. No oropharyngeal exudate.  Eyes: Right eye exhibits no discharge. Left eye exhibits no discharge. No scleral icterus.  Neck: Normal range of motion.  Cardiovascular: Normal rate, regular rhythm, S1 normal, S2 normal and normal heart sounds.   No murmur heard. Pulmonary/Chest: Effort normal and breath sounds normal. No accessory muscle usage. No respiratory distress.  Abdominal: Soft. Normal appearance and bowel sounds are normal. There is no tenderness.  Musculoskeletal: Normal range of motion. She exhibits no edema or tenderness.  Neurological: She appears lethargic. No cranial nerve deficit. Coordination normal. GCS eye subscore is 3. GCS verbal subscore is 3. GCS motor subscore is 6.  Patient drowsy, responsive to voice only. Patient answering simple questions with slurred speech.  Skin: Skin is warm and dry. No rash noted.  Multiple, small abrasions and lacerations noted to patient's bilateral ventral wrists. Lacerations are well approximated, not amenable to suturing, with hemorrhaging controlled. Patient also has a single laceration on her anterior neck, which is superficial, also does not require sutures, hemorrhaging controlled.  Psychiatric: She has a normal mood and affect.    ED Course  Procedures (including critical care time) Labs Review Labs Reviewed  CBC - Abnormal; Notable for the following:    RBC 3.83 (*)    All other components within normal limits  COMPREHENSIVE METABOLIC PANEL - Abnormal; Notable for the following:    Potassium 3.4 (*)    Glucose, Bld 102 (*)    ALT 55 (*)    All other components within normal limits  ETHANOL - Abnormal; Notable for the following:    Alcohol, Ethyl (B) 88 (*)    All other components within normal limits  SALICYLATE LEVEL - Abnormal; Notable for the following:    Salicylate  Lvl <5.7 (*)    All other components within normal limits  URINE RAPID DRUG SCREEN (HOSP PERFORMED) - Abnormal; Notable for the following:    Opiates POSITIVE (*)    Cocaine POSITIVE (*)    Benzodiazepines POSITIVE (*)    All other components within normal limits  ACETAMINOPHEN LEVEL  POC URINE PREG, ED    Imaging Review Dg Chest Portable 1 View  04/05/2014   CLINICAL DATA:  Drug overdose. Unresponsive. History of cervical cancer.  EXAM: PORTABLE CHEST - 1 VIEW  COMPARISON:  09/05/2013  FINDINGS: Slightly prominent lung markings at the right lung base. Otherwise, the lungs are clear. Heart size is normal. Negative for pneumothorax. No acute bone abnormality.  IMPRESSION: Slightly prominent lung markings at the right lung base without focal airspace disease.   Electronically Signed   By: Markus Daft M.D.   On: 04/05/2014 00:00     EKG Interpretation None      MDM   Final diagnoses:  Overdose  Patient here after suicide attempt with temazepam and cutting wrists and neck with kitchen knife and razor blade. Pt took appx 28 pills temazepam.  Patient drowsy, answering some questions appropriately, however has to be woken up frequently. Patient not obtunded, is arousable to voice, poison control recommending monitoring her for several hours for concern for respiratory/CNS depression, aspiration, otherwise regular med clearance labs are warranted.patient's wounds are superficial, do not require any suturing or repair of lacerations.patient's O2 saturation between low 90s and mid 80s. We will place patient on O2  [place] 2L/min.    Patient also positive for opiates, cocaine, benzos. Serum alcohol 88. We'll continue to monitor. Acetaminophen, salicylate negative. No leukocytosis, anemia, electrolyte abnormalities.  Patient signed out to Charlann Lange, PA-C with plan: Monitor patient for respiratory depression, consult to TTS for admission to inpatient psych after observatioin.    Signed,  Dahlia Bailiff, PA-C 2:38 AM    Carrie Mew, PA-C 04/05/14 5329  Julianne Rice, MD 04/06/14 [phone]

## 2014-04-04 NOTE — ED Notes (Addendum)
Pt from home via [facility] c/o suicide attempt in which she cut bilateral wrists with a razor blade (superficial lacerations) and cut the left side of neck with a fish scaler (superficial lacerations). Pt also reports to EMS that she snorted 28  15 mg Restoril and drank a four loco. White substance noted to right nostril. Bottle of Restoril filled on 04/02/14 bottle now empty. She denies any other drug use. [facility] reports that she was taken off the Xanax and cymbalta 4 days ago. She is alert an oriented but appears drowsy.  One belonging bag at nurses station consisting of one pair of blue shorts, T-shirt, a pair of flip flops(tan), pink and black bra.

## 2014-04-04 NOTE — ED Notes (Addendum)
Spoke with Oris Drone from [employer] and states the only concern she has is inhaling particulate and any respiratory issues that may arise from that.  States no other concerns, just regular medical clearance labs.  Call back for any further concerns.

## 2014-04-05 ENCOUNTER — Encounter (HOSPITAL_COMMUNITY): Payer: Self-pay | Admitting: Registered Nurse

## 2014-04-05 DIAGNOSIS — F332 Major depressive disorder, recurrent severe without psychotic features: Secondary | ICD-10-CM | POA: Diagnosis present

## 2014-04-05 DIAGNOSIS — T424X2A Poisoning by benzodiazepines, intentional self-harm, initial encounter: Secondary | ICD-10-CM

## 2014-04-05 DIAGNOSIS — T1491XA Suicide attempt, initial encounter: Secondary | ICD-10-CM | POA: Insufficient documentation

## 2014-04-05 DIAGNOSIS — R45851 Suicidal ideations: Secondary | ICD-10-CM

## 2014-04-05 DIAGNOSIS — X789XXA Intentional self-harm by unspecified sharp object, initial encounter: Secondary | ICD-10-CM

## 2014-04-05 LAB — POC URINE PREG, ED: Preg Test, Ur: NEGATIVE

## 2014-04-05 MED ORDER — CLONIDINE HCL 0.1 MG PO TABS: 0.1000 mg | ORAL_TABLET | ORAL | Status: DC

## 2014-04-05 MED ORDER — DULOXETINE HCL 60 MG PO CPEP
60.0000 mg | ORAL_CAPSULE | Freq: Every day | ORAL | Status: DC
  Administered 2014-04-05 – 2014-04-06 (×2): 60 mg via ORAL
  Filled 2014-04-05 (×3): qty 1

## 2014-04-05 MED ORDER — ALBUTEROL SULFATE HFA 108 (90 BASE) MCG/ACT IN AERS: 2.0000 | INHALATION_SPRAY | RESPIRATORY_TRACT | Status: DC | PRN

## 2014-04-05 MED ORDER — DICYCLOMINE HCL 20 MG PO TABS: 20.0000 mg | ORAL_TABLET | Freq: Four times a day (QID) | ORAL | Status: DC | PRN

## 2014-04-05 MED ORDER — CLONIDINE HCL 0.1 MG PO TABS: 0.1000 mg | ORAL_TABLET | Freq: Every day | ORAL | Status: DC

## 2014-04-05 MED ORDER — ONDANSETRON 4 MG PO TBDP: 4.0000 mg | ORAL_TABLET | Freq: Four times a day (QID) | ORAL | Status: DC | PRN

## 2014-04-05 MED ORDER — CLONIDINE HCL 0.1 MG PO TABS
0.1000 mg | ORAL_TABLET | Freq: Four times a day (QID) | ORAL | Status: DC
  Administered 2014-04-05 – 2014-04-06 (×4): 0.1 mg via ORAL
  Filled 2014-04-05 (×5): qty 1

## 2014-04-05 MED ORDER — METHOCARBAMOL 500 MG PO TABS
500.0000 mg | ORAL_TABLET | Freq: Three times a day (TID) | ORAL | Status: DC | PRN
  Administered 2014-04-05 – 2014-04-06 (×2): 500 mg via ORAL
  Filled 2014-04-05 (×2): qty 1

## 2014-04-05 MED ORDER — LOPERAMIDE HCL 2 MG PO CAPS: 2.0000 mg | ORAL_CAPSULE | ORAL | Status: DC | PRN

## 2014-04-05 MED ORDER — CHLORDIAZEPOXIDE HCL 25 MG PO CAPS: 25.0000 mg | ORAL_CAPSULE | Freq: Four times a day (QID) | ORAL | Status: DC | PRN

## 2014-04-05 MED ORDER — HYDROXYZINE HCL 25 MG PO TABS: 25.0000 mg | ORAL_TABLET | Freq: Four times a day (QID) | ORAL | Status: DC | PRN

## 2014-04-05 NOTE — ED Notes (Signed)
Pt now has IVC papers

## 2014-04-05 NOTE — ED Notes (Addendum)
When patient sleeps oxygen saturation drops to 89%-90% on room air. When patient is awake oxygen saturation is 95% on room air.

## 2014-04-05 NOTE — ED Provider Notes (Signed)
1:30: Patient care transferred from Kylie Bailiff, PA-C, as benzodiazapine overdose, transiently hypoxic, sleeping but easily aroused. She was found with empty bottle of her temazepam Rx filled 2 days prior with #30 1 mg tablets. Further details of overdose unclear.   4:15:  She is waking up. Recheck of O2 saturation on room air: 91% - suboptimal, improved.  6:15:  She is awake and eating. She confirms that she took the overdose in attempt to kill herself. She also admits to taking 3 of her Xanax in addition to the temazepam. IVC in place. Psych holding orders entered.   Plan: [facility] evaluation and placement.  Kylie Oats, PA-C 04/05/14 7414  Julianne Rice, MD 04/06/14 [phone]

## 2014-04-05 NOTE — ED Notes (Signed)
PA at bedside.

## 2014-04-05 NOTE — ED Notes (Signed)
Pt now sleeping

## 2014-04-05 NOTE — ED Notes (Signed)
Pt sitting up in bed drinking sprite.

## 2014-04-05 NOTE — Consult Note (Signed)
[facility] Face-to-Face Psychiatry Consult   Reason for Consult:  Suicidal ideation  Referring Physician:  EDP  Kylie [name] is an 43 y.o. female. Total Time spent with patient: 45 minutes  Assessment: AXIS I:  Major Depression, Recurrent severe, Substance Abuse and Substance Induced Mood Disorder AXIS II:  Deferred AXIS III:   Past Medical History  Diagnosis Date  . Hepatitis B infection   . Cervical cancer     Stage 2  . Degenerative disk disease   . Ovarian cyst   . Chronic UTI   . Anxiety   . Bipolar 1 disorder   . Panic attack    AXIS IV:  other psychosocial or environmental problems, problems related to social environment and problems with primary support group AXIS V:  11-20 some danger of hurting self or others possible OR occasionally fails to maintain minimal personal hygiene OR gross impairment in communication  Plan:  Recommend psychiatric Inpatient admission when medically cleared.  Subjective:    HPI:  Kylie [name] is a 43 y.o. female patient presented to [facility] after taking over dose of Restoril and cutting superficial cuts on arms bilaterally.  Patient states that she is here "Because I cut myself; it felt like the right thing to do.  I just wanted to go to sleep and not wake up."  Patient states that she is angry and agitated.  Patient denies homicidal ideation, psychosis, and paranoia.  Patient would not elaborate on reason or stressor for wanting to kill herself.  Patient unable to contract for safety.    HPI Elements:   Location:  Depression. Quality:  self inflicted injury. Severity:  overdose of ristoril. Timing:  1 day.  Past Psychiatric History: Past Medical History  Diagnosis Date  . Hepatitis B infection   . Cervical cancer     Stage 2  . Degenerative disk disease   . Ovarian cyst   . Chronic UTI   . Anxiety   . Bipolar 1 disorder   . Panic attack     reports that she has been smoking Cigarettes.  She has a 25 pack-year smoking history. She has  never used smokeless tobacco. She reports that she does not drink alcohol or use illicit drugs. History reviewed. No pertinent family history.         Allergies:   Allergies  Allergen Reactions  . Citalopram     Worsened anxiety  . Hydrocodone Itching  . Ibuprofen     Stomach pain  . Naproxen Swelling    troat and fingers swelling   . Povidone-Iodine Itching and Rash    ACT Assessment Complete:  Yes:    Educational Status    Risk to Self: Risk to self with the past 6 months Is patient at risk for suicide?: Yes Substance abuse history and/or treatment for substance abuse?: Yes  Risk to Others:    Abuse:    Prior Inpatient Therapy:    Prior Outpatient Therapy:    Additional Information:                    Objective: Blood pressure 105/61, pulse 76, temperature 97.9 F (36.6 C), temperature source Oral, resp. rate 20, last menstrual period 04/04/2014, SpO2 96 %.There is no weight on file to calculate BMI. Results for orders placed or performed during the hospital encounter of 04/04/14 (from the past 72 hour(s))  CBC     Status: Abnormal   Collection Time: 04/04/14 11:20 PM  Result Value  Ref Range   WBC 7.4 4.0 - 10.5 K/uL   RBC 3.83 (L) 3.87 - 5.11 MIL/uL   Hemoglobin 12.2 12.0 - 15.0 g/dL   HCT 36.7 36.0 - 46.0 %   MCV 95.8 78.0 - 100.0 fL   MCH 31.9 26.0 - 34.0 pg   MCHC 33.2 30.0 - 36.0 g/dL   RDW 14.1 11.5 - 15.5 %   Platelets 207 150 - 400 K/uL  Comprehensive metabolic panel     Status: Abnormal   Collection Time: 04/04/14 11:20 PM  Result Value Ref Range   Sodium 140 137 - 147 mEq/L   Potassium 3.4 (L) 3.7 - 5.3 mEq/L   Chloride 103 96 - 112 mEq/L   CO2 23 19 - 32 mEq/L   Glucose, Bld 102 (H) 70 - 99 mg/dL   BUN 7 6 - 23 mg/dL   Creatinine, Ser 0.67 0.50 - 1.10 mg/dL   Calcium 9.1 8.4 - 10.5 mg/dL   Total Protein 7.2 6.0 - 8.3 g/dL   Albumin 3.5 3.5 - 5.2 g/dL   AST 32 0 - 37 U/L   ALT 55 (H) 0 - 35 U/L   Alkaline Phosphatase 70 39 - 117  U/L   Total Bilirubin 0.4 0.3 - 1.2 mg/dL   GFR calc non Af Amer >90 >90 mL/min   GFR calc Af Amer >90 >90 mL/min    Comment: (NOTE) The eGFR has been calculated using the CKD EPI equation. This calculation has not been validated in all clinical situations. eGFR's persistently <90 mL/min signify possible Chronic Kidney Disease.    Anion gap 14 5 - 15  Ethanol (ETOH)     Status: Abnormal   Collection Time: 04/04/14 11:20 PM  Result Value Ref Range   Alcohol, Ethyl (B) 88 (H) 0 - 11 mg/dL    Comment:        LOWEST DETECTABLE LIMIT FOR SERUM ALCOHOL IS 11 mg/dL FOR MEDICAL PURPOSES ONLY   Acetaminophen level     Status: None   Collection Time: 04/04/14 11:20 PM  Result Value Ref Range   Acetaminophen (Tylenol), Serum <15.0 10 - 30 ug/mL    Comment:        THERAPEUTIC CONCENTRATIONS VARY SIGNIFICANTLY. A RANGE OF 10-30 ug/mL MAY BE AN EFFECTIVE CONCENTRATION FOR MANY PATIENTS. HOWEVER, SOME ARE BEST TREATED AT CONCENTRATIONS OUTSIDE THIS RANGE. ACETAMINOPHEN CONCENTRATIONS >150 ug/mL AT 4 HOURS AFTER INGESTION AND >50 ug/mL AT 12 HOURS AFTER INGESTION ARE OFTEN ASSOCIATED WITH TOXIC REACTIONS.   Salicylate level     Status: Abnormal   Collection Time: 04/04/14 11:20 PM  Result Value Ref Range   Salicylate Lvl <0.9 (L) 2.8 - 20.0 mg/dL  Urine rapid drug screen (hosp performed)     Status: Abnormal   Collection Time: 04/04/14 11:22 PM  Result Value Ref Range   Opiates POSITIVE (A) NONE DETECTED   Cocaine POSITIVE (A) NONE DETECTED   Benzodiazepines POSITIVE (A) NONE DETECTED   Amphetamines NONE DETECTED NONE DETECTED   Tetrahydrocannabinol NONE DETECTED NONE DETECTED   Barbiturates NONE DETECTED NONE DETECTED    Comment:        DRUG SCREEN FOR MEDICAL PURPOSES ONLY.  IF CONFIRMATION IS NEEDED FOR ANY PURPOSE, NOTIFY LAB WITHIN 5 DAYS.        LOWEST DETECTABLE LIMITS FOR URINE DRUG SCREEN Drug Class       Cutoff (ng/mL) Amphetamine      1000 Barbiturate       200 Benzodiazepine  025 Tricyclics       427 Opiates          300 Cocaine          300 THC              50   POC Urine Pregnancy, ED (if pre-menopausal female) - NOT at [facility]     Status: None   Collection Time: 04/04/14 11:30 PM  Result Value Ref Range   Preg Test, Ur NEGATIVE NEGATIVE    Comment:        THE SENSITIVITY OF THIS METHODOLOGY IS >24 mIU/mL    Labs are reviewed see above values; Polysubstance abuse.  Medications reviewed and no changes made.  Clonidine and Librium started withdrawal protocol  No current facility-administered medications for this encounter.   Current Outpatient Prescriptions  Medication Sig Dispense Refill  . albuterol (PROVENTIL HFA;VENTOLIN HFA) 108 (90 BASE) MCG/ACT inhaler Inhale 2 puffs into the lungs every 4 (four) hours as needed for wheezing. 1 Inhaler 0  . ALPRAZolam (XANAX) 1 MG tablet Take 1 tablet (1 mg total) by mouth at bedtime. (Patient taking differently: Take 1 mg by mouth 2 (two) times daily. ) 10 tablet 0  . cyclobenzaprine (FLEXERIL) 10 MG tablet Take 1 tablet (10 mg total) by mouth 3 (three) times daily as needed for muscle spasms. 30 tablet 3  . DULoxetine (CYMBALTA) 60 MG capsule Take 1 capsule (60 mg total) by mouth daily. 90 capsule 3  . oxyCODONE-acetaminophen (PERCOCET) 10-325 MG per tablet Take 1 tablet by mouth every 4 (four) hours as needed for pain.    Marland Kitchen azithromycin (ZITHROMAX) 250 MG tablet Take 1 tablet (250 mg total) by mouth daily. Take first 2 tablets together, then 1 every day until finished. 6 tablet 0  . busPIRone (BUSPAR) 30 MG tablet Take 30 mg by mouth daily.    Marland Kitchen gabapentin (NEURONTIN) 300 MG capsule Take 1 capsule (300 mg total) by mouth 3 (three) times daily. (Patient taking differently: Take 600 mg by mouth at bedtime. ) 90 capsule 5  . ibuprofen (ADVIL,MOTRIN) 600 MG tablet Take 1 tablet (600 mg total) by mouth every 6 (six) hours as needed. 30 tablet 0  . naproxen (NAPROSYN) 375 MG tablet Take 1 tablet (375 mg  total) by mouth 2 (two) times daily with a meal. 14 tablet 0  . nitrofurantoin, macrocrystal-monohydrate, (MACROBID) 100 MG capsule Take 1 capsule (100 mg total) by mouth 2 (two) times daily. X 7 days 14 capsule 0  . phenazopyridine (PYRIDIUM) 200 MG tablet Take 1 tablet (200 mg total) by mouth 3 (three) times daily. 6 tablet 0    Psychiatric Specialty Exam:     Blood pressure 105/61, pulse 76, temperature 97.9 F (36.6 C), temperature source Oral, resp. rate 20, last menstrual period 04/04/2014, SpO2 96 %.There is no weight on file to calculate BMI.  General Appearance: Casual and Disheveled  Eye Contact::  Good  Speech:  Clear and Coherent and Normal Rate  Volume:  Increased  Mood:  Angry, Anxious and Irritable  Affect:  Blunt and Congruent  Thought Process:  Circumstantial  Orientation:  Full (Time, Place, and Person)  Thought Content:  Rumination  Suicidal Thoughts:  Yes.  with intent/plan  Homicidal Thoughts:  No  Memory:  Immediate;   Fair Recent;   Fair Remote;   Fair  Judgement:  Poor  Insight:  Lacking  Psychomotor Activity:  Normal  Concentration:  Fair  Recall:  [employer] of  Knowledge:Fair  Language: Good  Akathisia:  No  Handed:  Right  AIMS (if indicated):     Assets:  Communication Skills Desire for Improvement Housing  Sleep:      Musculoskeletal: Strength & Muscle Tone: within normal limits Gait & Station: normal Patient leans: N/A  Treatment Plan Summary: Daily contact with patient to assess and evaluate symptoms and progress in treatment Medication management Inpatient treatment recommended  Earleen Newport, FNP-BC 04/05/2014 12:34 PM  Patient seen, evaluated and I agree with notes by Nurse Practitioner. Corena Pilgrim, MD

## 2014-04-05 NOTE — ED Notes (Signed)
Superficial lacerations to wrist, forearms, and left neck cleaned with gauze and saline.

## 2014-04-05 NOTE — ED Notes (Addendum)
Pt awake. She is alert and oriented. She denies wanting to hurt herself right now and denies SI. She states " I did not take 28 restoril" but does not state how many she took. Matheny removed to monitor room air oxygen saturation. Pt requesting pain medication.

## 2014-04-05 NOTE — ED Notes (Signed)
During hourly rounding, this writer observed Pt's O2 saturation drop to 87%. Pt was placed on 2LPM O2 via nasal cannula. RN aware.    Pt was also given a sprite. Pt was very appreciative.

## 2014-04-05 NOTE — ED Notes (Signed)
Graham crackers and ginger ale provided.

## 2014-04-06 ENCOUNTER — Encounter (HOSPITAL_COMMUNITY): Payer: Self-pay

## 2014-04-06 ENCOUNTER — Inpatient Hospital Stay (HOSPITAL_COMMUNITY)
Admission: AD | Admit: 2014-04-06 | Discharge: 2014-04-08 | DRG: 885 | Disposition: A | Discharge status: Home / Self Care | Payer: Medicaid Other | Source: Intra-hospital | Attending: Psychiatry | Admitting: Psychiatry

## 2014-04-06 ENCOUNTER — Encounter (HOSPITAL_COMMUNITY): Payer: Self-pay | Admitting: Psychiatry

## 2014-04-06 DIAGNOSIS — F410 Panic disorder [episodic paroxysmal anxiety] without agoraphobia: Secondary | ICD-10-CM | POA: Diagnosis present

## 2014-04-06 DIAGNOSIS — T424X2A Poisoning by benzodiazepines, intentional self-harm, initial encounter: Secondary | ICD-10-CM | POA: Diagnosis not present

## 2014-04-06 DIAGNOSIS — G4700 Insomnia, unspecified: Secondary | ICD-10-CM | POA: Diagnosis present

## 2014-04-06 DIAGNOSIS — F10129 Alcohol abuse with intoxication, unspecified: Secondary | ICD-10-CM | POA: Diagnosis present

## 2014-04-06 DIAGNOSIS — F331 Major depressive disorder, recurrent, moderate: Secondary | ICD-10-CM | POA: Diagnosis present

## 2014-04-06 DIAGNOSIS — F411 Generalized anxiety disorder: Secondary | ICD-10-CM | POA: Diagnosis present

## 2014-04-06 DIAGNOSIS — T50901A Poisoning by unspecified drugs, medicaments and biological substances, accidental (unintentional), initial encounter: Secondary | ICD-10-CM | POA: Insufficient documentation

## 2014-04-06 DIAGNOSIS — F1994 Other psychoactive substance use, unspecified with psychoactive substance-induced mood disorder: Secondary | ICD-10-CM

## 2014-04-06 DIAGNOSIS — F339 Major depressive disorder, recurrent, unspecified: Secondary | ICD-10-CM | POA: Diagnosis present

## 2014-04-06 DIAGNOSIS — R45851 Suicidal ideations: Secondary | ICD-10-CM

## 2014-04-06 DIAGNOSIS — Z599 Problem related to housing and economic circumstances, unspecified: Secondary | ICD-10-CM | POA: Diagnosis not present

## 2014-04-06 DIAGNOSIS — F1414 Cocaine abuse with cocaine-induced mood disorder: Secondary | ICD-10-CM | POA: Diagnosis present

## 2014-04-06 DIAGNOSIS — F332 Major depressive disorder, recurrent severe without psychotic features: Secondary | ICD-10-CM

## 2014-04-06 DIAGNOSIS — F17210 Nicotine dependence, cigarettes, uncomplicated: Secondary | ICD-10-CM | POA: Diagnosis present

## 2014-04-06 DIAGNOSIS — B1910 Unspecified viral hepatitis B without hepatic coma: Secondary | ICD-10-CM | POA: Diagnosis present

## 2014-04-06 DIAGNOSIS — Z8541 Personal history of malignant neoplasm of cervix uteri: Secondary | ICD-10-CM

## 2014-04-06 MED ORDER — CHLORDIAZEPOXIDE HCL 25 MG PO CAPS
25.0000 mg | ORAL_CAPSULE | Freq: Four times a day (QID) | ORAL | Status: AC | PRN
  Administered 2014-04-06 – 2014-04-07 (×2): 25 mg via ORAL
  Filled 2014-04-06 (×2): qty 1

## 2014-04-06 MED ORDER — CLONIDINE HCL 0.1 MG PO TABS
0.1000 mg | ORAL_TABLET | ORAL | Status: DC
  Administered 2014-04-07 – 2014-04-08 (×2): 0.1 mg via ORAL
  Filled 2014-04-06 (×5): qty 1

## 2014-04-06 MED ORDER — DICYCLOMINE HCL 20 MG PO TABS: 20.0000 mg | ORAL_TABLET | Freq: Four times a day (QID) | ORAL | Status: DC | PRN

## 2014-04-06 MED ORDER — LOPERAMIDE HCL 2 MG PO CAPS: 2.0000 mg | ORAL_CAPSULE | ORAL | Status: DC | PRN

## 2014-04-06 MED ORDER — CLONIDINE HCL 0.1 MG PO TABS
0.1000 mg | ORAL_TABLET | Freq: Every day | ORAL | Status: DC
  Filled 2014-04-06: qty 1

## 2014-04-06 MED ORDER — DIPHENHYDRAMINE HCL 50 MG/ML IJ SOLN
50.0000 mg | Freq: Once | INTRAMUSCULAR | Status: AC
  Administered 2014-04-06: 50 mg via INTRAMUSCULAR
  Filled 2014-04-06: qty 1

## 2014-04-06 MED ORDER — DULOXETINE HCL 60 MG PO CPEP
60.0000 mg | ORAL_CAPSULE | Freq: Every day | ORAL | Status: DC
  Filled 2014-04-06 (×2): qty 1

## 2014-04-06 MED ORDER — METHOCARBAMOL 500 MG PO TABS
500.0000 mg | ORAL_TABLET | Freq: Three times a day (TID) | ORAL | Status: DC | PRN
  Administered 2014-04-07 – 2014-04-08 (×2): 500 mg via ORAL
  Filled 2014-04-06 (×2): qty 1

## 2014-04-06 MED ORDER — HYDROXYZINE HCL 25 MG PO TABS
25.0000 mg | ORAL_TABLET | Freq: Four times a day (QID) | ORAL | Status: DC | PRN
  Administered 2014-04-06 – 2014-04-07 (×3): 25 mg via ORAL
  Filled 2014-04-06 (×3): qty 1

## 2014-04-06 MED ORDER — CLONIDINE HCL 0.1 MG PO TABS
0.1000 mg | ORAL_TABLET | Freq: Four times a day (QID) | ORAL | Status: AC
  Administered 2014-04-06 (×2): 0.1 mg via ORAL
  Filled 2014-04-06 (×4): qty 1

## 2014-04-06 MED ORDER — LORAZEPAM 2 MG/ML IJ SOLN
2.0000 mg | Freq: Once | INTRAMUSCULAR | Status: AC
  Administered 2014-04-06: 2 mg via INTRAMUSCULAR
  Filled 2014-04-06: qty 1

## 2014-04-06 MED ORDER — ONDANSETRON 4 MG PO TBDP
4.0000 mg | ORAL_TABLET | Freq: Four times a day (QID) | ORAL | Status: DC | PRN
  Administered 2014-04-06 – 2014-04-08 (×3): 4 mg via ORAL
  Filled 2014-04-06 (×3): qty 1

## 2014-04-06 MED ORDER — MAGNESIUM HYDROXIDE 400 MG/5ML PO SUSP: 30.0000 mL | Freq: Every day | ORAL | Status: DC | PRN

## 2014-04-06 MED ORDER — ACETAMINOPHEN 325 MG PO TABS: 650.0000 mg | ORAL_TABLET | Freq: Four times a day (QID) | ORAL | Status: DC | PRN

## 2014-04-06 MED ORDER — ALBUTEROL SULFATE HFA 108 (90 BASE) MCG/ACT IN AERS: 2.0000 | INHALATION_SPRAY | RESPIRATORY_TRACT | Status: DC | PRN

## 2014-04-06 MED ORDER — NICOTINE 21 MG/24HR TD PT24
21.0000 mg | MEDICATED_PATCH | Freq: Every day | TRANSDERMAL | Status: DC
  Administered 2014-04-06 – 2014-04-08 (×3): 21 mg via TRANSDERMAL
  Filled 2014-04-06 (×5): qty 1

## 2014-04-06 NOTE — [facility] Counselor (Signed)
Per Letitia Libra [facility] at [facility], pt has been accepted to bed 300-2. Waylan Boga NP is accepting provider.  Arnold Long, [place] Assessment Counselor

## 2014-04-06 NOTE — Consult Note (Signed)
[facility] Consult   Reason for Consult:  Suicidal ideation  Referring Physician:  EDP  [name] is an 43 y.o. female. Total Time spent with patient: 30 minutes  Assessment: AXIS I:  Major Depression, Recurrent severe, Substance Abuse and Substance Induced Mood Disorder AXIS II:  Deferred AXIS III:   Past Medical History  Diagnosis Date  . Hepatitis B infection   . Cervical cancer     Stage 2  . Degenerative disk disease   . Ovarian cyst   . Chronic UTI   . Anxiety   . Bipolar 1 disorder   . Panic attack    AXIS IV:  other psychosocial or environmental problems, problems related to social environment and problems with primary support group AXIS V:  11-20 some danger of hurting self or others possible OR occasionally fails to maintain minimal personal hygiene OR gross impairment in communication  Plan:  Recommend psychiatric Inpatient admission when medically cleared.  Subjective:    HPI:  Kylie [name] is a 43 y.o. female patient presented to [facility] after taking over dose of Restoril and cutting superficial cuts on arms bilaterally.  Patient states that she is here "Because I cut myself; it felt like the right thing to do.  I just wanted to go to sleep and not wake up."  Patient states that she is angry and agitated.  Patient denies homicidal ideation, psychosis, and paranoia.  Patient would not elaborate on reason or stressor for wanting to kill herself.  Patient unable to contract for safety.   Today:  The patient remains irritable and wants to go home.  She claims she does not remember what brought her here or taking the overdose or cutting herself.  Regardless, the psychiatrist feels she is a threat to herself and should be admitted.  She had an intentional overdose in 2014 and had an outpatient appointment recently and demanded Xanax, not given.  Ashaya remains unstable and threat to herself. HPI Elements:   Location:  Depression. Quality:  self  inflicted injury. Severity:  overdose of ristoril. Timing:  1 day.  Past Psychiatric History: Past Medical History  Diagnosis Date  . Hepatitis B infection   . Cervical cancer     Stage 2  . Degenerative disk disease   . Ovarian cyst   . Chronic UTI   . Anxiety   . Bipolar 1 disorder   . Panic attack     reports that she has been smoking Cigarettes.  She has a 25 pack-year smoking history. She has never used smokeless tobacco. She reports that she does not drink alcohol or use illicit drugs. History reviewed. No pertinent family history.         Allergies:   Allergies  Allergen Reactions  . Citalopram     Worsened anxiety  . Hydrocodone Itching  . Ibuprofen     Stomach pain  . Naproxen Swelling    troat and fingers swelling   . Povidone-Iodine Itching and Rash    ACT Assessment Complete:  Yes:    Educational Status    Risk to Self: Risk to self with the past 6 months Is patient at risk for suicide?: Yes Substance abuse history and/or treatment for substance abuse?: Yes  Risk to Others:    Abuse:    Prior Inpatient Therapy:    Prior Outpatient Therapy:    Additional Information:  Objective: Blood pressure 109/69, pulse 76, temperature 98.7 F (37.1 C), temperature source Oral, resp. rate 16, last menstrual period 04/04/2014, SpO2 98 %.There is no weight on file to calculate BMI. Results for orders placed or performed during the hospital encounter of 04/04/14 (from the past 72 hour(s))  CBC     Status: Abnormal   Collection Time: 04/04/14 11:20 PM  Result Value Ref Range   WBC 7.4 4.0 - 10.5 K/uL   RBC 3.83 (L) 3.87 - 5.11 MIL/uL   Hemoglobin 12.2 12.0 - 15.0 g/dL   HCT 36.7 36.0 - 46.0 %   MCV 95.8 78.0 - 100.0 fL   MCH 31.9 26.0 - 34.0 pg   MCHC 33.2 30.0 - 36.0 g/dL   RDW 14.1 11.5 - 15.5 %   Platelets 207 150 - 400 K/uL  Comprehensive metabolic panel     Status: Abnormal   Collection Time: 04/04/14 11:20 PM  Result  Value Ref Range   Sodium 140 137 - 147 mEq/L   Potassium 3.4 (L) 3.7 - 5.3 mEq/L   Chloride 103 96 - 112 mEq/L   CO2 23 19 - 32 mEq/L   Glucose, Bld 102 (H) 70 - 99 mg/dL   BUN 7 6 - 23 mg/dL   Creatinine, Ser 0.67 0.50 - 1.10 mg/dL   Calcium 9.1 8.4 - 10.5 mg/dL   Total Protein 7.2 6.0 - 8.3 g/dL   Albumin 3.5 3.5 - 5.2 g/dL   AST 32 0 - 37 U/L   ALT 55 (H) 0 - 35 U/L   Alkaline Phosphatase 70 39 - 117 U/L   Total Bilirubin 0.4 0.3 - 1.2 mg/dL   GFR calc non Af Amer >90 >90 mL/min   GFR calc Af Amer >90 >90 mL/min    Comment: (NOTE) The eGFR has been calculated using the CKD EPI equation. This calculation has not been validated in all clinical situations. eGFR's persistently <90 mL/min signify possible Chronic Kidney Disease.    Anion gap 14 5 - 15  Ethanol (ETOH)     Status: Abnormal   Collection Time: 04/04/14 11:20 PM  Result Value Ref Range   Alcohol, Ethyl (B) 88 (H) 0 - 11 mg/dL    Comment:        LOWEST DETECTABLE LIMIT FOR SERUM ALCOHOL IS 11 mg/dL FOR MEDICAL PURPOSES ONLY   Acetaminophen level     Status: None   Collection Time: 04/04/14 11:20 PM  Result Value Ref Range   Acetaminophen (Tylenol), Serum <15.0 10 - 30 ug/mL    Comment:        THERAPEUTIC CONCENTRATIONS VARY SIGNIFICANTLY. A RANGE OF 10-30 ug/mL MAY BE AN EFFECTIVE CONCENTRATION FOR MANY PATIENTS. HOWEVER, SOME ARE BEST TREATED AT CONCENTRATIONS OUTSIDE THIS RANGE. ACETAMINOPHEN CONCENTRATIONS >150 ug/mL AT 4 HOURS AFTER INGESTION AND >50 ug/mL AT 12 HOURS AFTER INGESTION ARE OFTEN ASSOCIATED WITH TOXIC REACTIONS.   Salicylate level     Status: Abnormal   Collection Time: 04/04/14 11:20 PM  Result Value Ref Range   Salicylate Lvl <5.0 (L) 2.8 - 20.0 mg/dL  Urine rapid drug screen (hosp performed)     Status: Abnormal   Collection Time: 04/04/14 11:22 PM  Result Value Ref Range   Opiates POSITIVE (A) NONE DETECTED   Cocaine POSITIVE (A) NONE DETECTED   Benzodiazepines POSITIVE (A)  NONE DETECTED   Amphetamines NONE DETECTED NONE DETECTED   Tetrahydrocannabinol NONE DETECTED NONE DETECTED   Barbiturates NONE DETECTED NONE DETECTED    Comment:  DRUG SCREEN FOR MEDICAL PURPOSES ONLY.  IF CONFIRMATION IS NEEDED FOR ANY PURPOSE, NOTIFY LAB WITHIN 5 DAYS.        LOWEST DETECTABLE LIMITS FOR URINE DRUG SCREEN Drug Class       Cutoff (ng/mL) Amphetamine      1000 Barbiturate      200 Benzodiazepine   161 Tricyclics       096 Opiates          300 Cocaine          300 THC              50   POC Urine Pregnancy, ED (if pre-menopausal female) - NOT at [facility]     Status: None   Collection Time: 04/04/14 11:30 PM  Result Value Ref Range   Preg Test, Ur NEGATIVE NEGATIVE    Comment:        THE SENSITIVITY OF THIS METHODOLOGY IS >24 mIU/mL    Labs are reviewed see above values; Polysubstance abuse.  Medications reviewed and no changes made.  Clonidine and Librium started withdrawal protocol  Current Facility-Administered Medications  Medication Dose Route Frequency Provider Last Rate Last Dose  . albuterol (PROVENTIL HFA;VENTOLIN HFA) 108 (90 BASE) MCG/ACT inhaler 2 puff  2 puff Inhalation Q4H PRN Shuvon Rankin, NP      . chlordiazePOXIDE (LIBRIUM) capsule 25 mg  25 mg Oral Q6H PRN Shuvon Rankin, NP      . cloNIDine (CATAPRES) tablet 0.1 mg  0.1 mg Oral QID Shuvon Rankin, NP   0.1 mg at 04/06/14 1343   Followed by  . [START ON 04/07/2014] cloNIDine (CATAPRES) tablet 0.1 mg  0.1 mg Oral BH-qamhs Shuvon Rankin, NP       Followed by  . [START ON 04/10/2014] cloNIDine (CATAPRES) tablet 0.1 mg  0.1 mg Oral QAC breakfast Shuvon Rankin, NP      . dicyclomine (BENTYL) tablet 20 mg  20 mg Oral Q6H PRN Shuvon Rankin, NP      . DULoxetine (CYMBALTA) DR capsule 60 mg  60 mg Oral Daily Shuvon Rankin, NP   60 mg at 04/06/14 0911  . hydrOXYzine (ATARAX/VISTARIL) tablet 25 mg  25 mg Oral Q6H PRN Shuvon Rankin, NP      . loperamide (IMODIUM) capsule 2-4 mg  2-4 mg Oral PRN Shuvon  Rankin, NP      . methocarbamol (ROBAXIN) tablet 500 mg  500 mg Oral Q8H PRN Shuvon Rankin, NP   500 mg at 04/06/14 0911  . ondansetron (ZOFRAN-ODT) disintegrating tablet 4 mg  4 mg Oral Q6H PRN Shuvon Rankin, NP       Current Outpatient Prescriptions  Medication Sig Dispense Refill  . albuterol (PROVENTIL HFA;VENTOLIN HFA) 108 (90 BASE) MCG/ACT inhaler Inhale 2 puffs into the lungs every 4 (four) hours as needed for wheezing. 1 Inhaler 0  . ALPRAZolam (XANAX) 1 MG tablet Take 1 tablet (1 mg total) by mouth at bedtime. (Patient taking differently: Take 1 mg by mouth 2 (two) times daily. ) 10 tablet 0  . cyclobenzaprine (FLEXERIL) 10 MG tablet Take 1 tablet (10 mg total) by mouth 3 (three) times daily as needed for muscle spasms. 30 tablet 3  . DULoxetine (CYMBALTA) 60 MG capsule Take 1 capsule (60 mg total) by mouth daily. 90 capsule 3  . oxyCODONE-acetaminophen (PERCOCET) 10-325 MG per tablet Take 1 tablet by mouth every 4 (four) hours as needed for pain.    Marland Kitchen azithromycin (ZITHROMAX) 250 MG tablet Take 1 tablet (  250 mg total) by mouth daily. Take first 2 tablets together, then 1 every day until finished. 6 tablet 0  . busPIRone (BUSPAR) 30 MG tablet Take 30 mg by mouth daily.    Marland Kitchen gabapentin (NEURONTIN) 300 MG capsule Take 1 capsule (300 mg total) by mouth 3 (three) times daily. (Patient taking differently: Take 600 mg by mouth at bedtime. ) 90 capsule 5  . ibuprofen (ADVIL,MOTRIN) 600 MG tablet Take 1 tablet (600 mg total) by mouth every 6 (six) hours as needed. 30 tablet 0  . naproxen (NAPROSYN) 375 MG tablet Take 1 tablet (375 mg total) by mouth 2 (two) times daily with a meal. 14 tablet 0  . nitrofurantoin, macrocrystal-monohydrate, (MACROBID) 100 MG capsule Take 1 capsule (100 mg total) by mouth 2 (two) times daily. X 7 days 14 capsule 0  . phenazopyridine (PYRIDIUM) 200 MG tablet Take 1 tablet (200 mg total) by mouth 3 (three) times daily. 6 tablet 0    Psychiatric Specialty Exam:      Blood pressure 109/69, pulse 76, temperature 98.7 F (37.1 C), temperature source Oral, resp. rate 16, last menstrual period 04/04/2014, SpO2 98 %.There is no weight on file to calculate BMI.  General Appearance: Casual and Disheveled  Eye Contact::  Good  Speech:  Clear and Coherent and Normal Rate  Volume:  Increased  Mood:  Angry, Anxious and Irritable  Affect:  Blunt and Congruent  Thought Process:  Circumstantial  Orientation:  Full (Time, Place, and Person)  Thought Content:  Rumination  Suicidal Thoughts:  Yes.  with intent/plan  Homicidal Thoughts:  No  Memory:  Immediate;   Fair Recent;   Fair Remote;   Fair  Judgement:  Poor  Insight:  Lacking  Psychomotor Activity:  Normal  Concentration:  Fair  Recall:  [employer] [place]: Good  Akathisia:  No  Handed:  Right  AIMS (if indicated):     Assets:  Communication Skills Desire for Improvement Housing  Sleep:      Musculoskeletal: Strength & Muscle Tone: within normal limits Gait & Station: normal Patient leans: N/A  Treatment Plan Summary: Daily contact with patient to assess and evaluate symptoms and progress in treatment Medication management Inpatient treatment recommended  Waylan Boga, [place] 04/06/2014 3:49 PM  Patient seen, evaluated and I agree with notes by Nurse Practitioner. Corena Pilgrim, MD

## 2014-04-06 NOTE — Progress Notes (Signed)
Pt denied any SI/HI/AVH. Pt reports having moments of "hot and cold" in relation to her opiate withdrawal.

## 2014-04-06 NOTE — Progress Notes (Signed)
  CARE MANAGEMENT ED NOTE 04/06/2014  Patient:  Kylie [name], Kylie [name]   Account Number:  [id number]  Date Initiated:  04/06/2014  Documentation initiated by:  Jackelyn Poling  Subjective/Objective Assessment:   43 yr old medicaid [place] access  [place] pt suicide attempt/cut wrists-took 28 of 15 mg temazepam                  Subjective/Objective Assessment Detail:   pcp is [facility] [address] [place] Telephone: [phone]     Action/Plan:   updated pt on her assigned pcp per e medicaid and informed her cm would enter in f/u d/c instructions Discussed if she wanted to go to "[facility]" she needs to change pcp with her local DSS staff   Action/Plan Detail:   Anticipated DC Date:       Status Recommendation to Physician:   Result of Recommendation:    Other ED Services  Consult [facility] [place]  Other  Outpatient Services - Pt will follow up  PCP issues    Choice offered to / List presented to:            Status of service:  Completed, signed off  ED Comments:   ED Comments Detail:

## 2014-04-06 NOTE — Discharge Instructions (Signed)
Pt is to be transfered to adult unit report called to [facility]. Pt shows no insight into previous behavior. Denies suicidal homicidal ideation. [facility] to transport to [facility].

## 2014-04-06 NOTE — BHH Counselor (Signed)
First Opinion completed and placed in pt's chart.  Arnold Long, [place] Assessment Counselor

## 2014-04-06 NOTE — Tx Team (Signed)
Initial Interdisciplinary Treatment Plan   PATIENT STRESSORS: Health problems Medication change or noncompliance Substance abuse   PROBLEM LIST: Problem List/Patient Goals Date to be addressed Date deferred Reason deferred Estimated date of resolution  Depression 04/06/14     Substance abuse 04/06/14                                                DISCHARGE CRITERIA:  Ability to meet basic life and health needs Adequate post-discharge living arrangements Reduction of life-threatening or endangering symptoms to within safe limits Withdrawal symptoms are absent or subacute and managed without 24-hour nursing intervention  PRELIMINARY DISCHARGE PLAN: Outpatient therapy Return to previous living arrangement  PATIENT/FAMIILY INVOLVEMENT: This treatment plan has been presented to and reviewed with the patient, Kylie [name], and/or family member, .  The patient and family have been given the opportunity to ask questions and make suggestions.  Maureen Chatters [facility] 04/06/2014, 7:54 PM

## 2014-04-06 NOTE — Progress Notes (Signed)
Patient ID: Kylie [name], female   DOB: [birth year], 43 y.o.   MRN: [id number]  Patient was a transfer from psych ED today. She was made involuntary. Came into ED after making superficial scratches on lt arm. Patient positive for opiates, cocaine, and benzo. Reports going to [facility] for opiate meds for back and hip. Patient reports chronic pain in lt. Hip and says that they recommend a hip replacement. Put on clonidine protocol due to not starting opiate. Hx of asthma, cervical cancer, Hepatitis B, and DDD. Adamantly denies taking an overdose of Restoril as reported. Does not feel that she needs inpatient treatment but was cooperative with admission.

## 2014-04-07 ENCOUNTER — Encounter (HOSPITAL_COMMUNITY): Payer: Self-pay | Admitting: Psychiatry

## 2014-04-07 DIAGNOSIS — F10129 Alcohol abuse with intoxication, unspecified: Secondary | ICD-10-CM | POA: Diagnosis present

## 2014-04-07 DIAGNOSIS — F411 Generalized anxiety disorder: Secondary | ICD-10-CM | POA: Diagnosis present

## 2014-04-07 DIAGNOSIS — F410 Panic disorder [episodic paroxysmal anxiety] without agoraphobia: Secondary | ICD-10-CM

## 2014-04-07 DIAGNOSIS — F1919 Other psychoactive substance abuse with unspecified psychoactive substance-induced disorder: Secondary | ICD-10-CM

## 2014-04-07 DIAGNOSIS — F1414 Cocaine abuse with cocaine-induced mood disorder: Secondary | ICD-10-CM | POA: Diagnosis present

## 2014-04-07 DIAGNOSIS — F331 Major depressive disorder, recurrent, moderate: Principal | ICD-10-CM

## 2014-04-07 MED ORDER — GABAPENTIN 300 MG PO CAPS
300.0000 mg | ORAL_CAPSULE | Freq: Every day | ORAL | Status: DC
  Administered 2014-04-07: 600 mg via ORAL
  Filled 2014-04-07: qty 14
  Filled 2014-04-07: qty 1
  Filled 2014-04-07: qty 14
  Filled 2014-04-07 (×2): qty 1

## 2014-04-07 MED ORDER — DULOXETINE HCL 30 MG PO CPEP
60.0000 mg | ORAL_CAPSULE | Freq: Every day | ORAL | Status: DC
  Administered 2014-04-07 – 2014-04-08 (×2): 60 mg via ORAL
  Filled 2014-04-07 (×2): qty 2
  Filled 2014-04-07: qty 28
  Filled 2014-04-07: qty 2
  Filled 2014-04-07: qty 28
  Filled 2014-04-07: qty 2

## 2014-04-07 MED ORDER — ALPRAZOLAM 1 MG PO TABS
1.0000 mg | ORAL_TABLET | Freq: Two times a day (BID) | ORAL | Status: DC
  Administered 2014-04-07 – 2014-04-08 (×4): 1 mg via ORAL
  Filled 2014-04-07 (×4): qty 1

## 2014-04-07 NOTE — Progress Notes (Signed)
Patient has been in bed resting and requested zofran d/t feeling nauseated. She voiced no other concerns and was complaint with medication. She denies si/hi/a/v hallucinations. Writer encouraged her to continue resting and gingerale given with medication. Safety maintained on ni with 15 min checks.

## 2014-04-07 NOTE — BHH Group Notes (Signed)
[facility] LCSW Group Therapy  04/07/2014 2:37 PM  Type of Therapy:  Group Therapy  Participation Level:  Did Not Attend  Christene Lye MSW, LCSW   Lyla Glassing 04/07/2014, 2:37 PM

## 2014-04-07 NOTE — Plan of Care (Signed)
Problem: Alteration in mood & ability to function due to Goal: LTG-Pt verbalizes understanding of importance of med regimen (Patient verbalizes understanding of importance of medication regimen and need to continue outpatient care and support groups)  Outcome: Progressing

## 2014-04-07 NOTE — BHH Group Notes (Signed)
0900 nursing orientation group     The focus of this group is to educate the patient on the purpose and policies of crisis stabilization and provide a format to answer questions about their admission.  The group details unit policies and expectations of patients while admitted.   Pt did not attend she was asleep in her bed

## 2014-04-07 NOTE — Plan of Care (Signed)
Problem: Ineffective individual coping Goal: STG: Patient will remain free from self harm Outcome: Progressing
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
## 2014-04-07 NOTE — Plan of Care (Signed)
Problem: Alteration in mood & ability to function due to Goal: STG-Patient will attend groups Outcome: Progressing

## 2014-04-07 NOTE — Progress Notes (Signed)
Patient ID: Kylie [name], female   DOB: 1970-12-10, 43 y.o.   MRN: [id number]  D: Pt currently presents with a flat affect and anxious behavior. Pt is concerend that the Cymbalta she is receiving is not the one she took at home. Pt states "my pill was dark blue and green, not white and grey like this one. Per self inventory, pt rates depression at a 1, hopelessness 0 and anxiety 9. Pt's daily goal is to "get home to my fiance (that I love so much) and my dog". She intends to do so by "whatever I have to do." Pt wants to know "when can I go home." Pt reports back and hip pain. Pt reports having increased anxiety, some mild nausea and feelings of diaphoresis. Pt has been increasingly concerned and anxious about receiving her medications throughout the day. Pt states "I can only get a ride from my boyfriend to go home tomorrow. His mom doesn't work then and that's when he can come get me."  A: Pt is given medications per providers. Pt supported emotionally and encouraged to express feelings and concerns. Pt encouraged to frequently rehydrate and to eat healthy meals. Pt's vitals monitored continuously throughout the day. Pt consulted Md about discharge plans and pt has signed a 72 release form.   R: Pt's gait is steady and pt has been walking to the cafeteria. Pt's safety ensured with 15 minute environmental checks. Pt currently denies SI/HI and A/V hallucinations. Pt verbally agrees to seek staff if SI/HI or A/VH occurs and to consult with staff before acting on these thoughts. Pt reported relief from the PRN medications given throughout the day. Pt verbalized that she feels better that she is back on her typical medication schedule and is looking forward to going home tomorrow.    Elenore Rota, RN

## 2014-04-07 NOTE — Progress Notes (Signed)
[facility] Group Notes:  (Nursing/MHT/Case Management/Adjunct)  Date:  04/07/2014  Time:  3:48 PM  Type of Therapy:  Therapeutic Activity  Participation Level:  Active  Participation Quality:  Appropriate, Attentive and Supportive  Affect:  Appropriate  Engagement in Group:  Engaged   Raysha Tilmon C 04/07/2014, 3:48 PM 

## 2014-04-07 NOTE — [facility] Group Notes (Signed)
[place] Group Notes:  (Nursing/MHT/Case Management/Adjunct)  Date:  04/07/2014  Time:  4:15 PM  Type of Therapy:  Psychoeducational Skills  Participation Level:  Active  Participation Quality:  Appropriate and Attentive  Affect:  Anxious and Irritable  Cognitive:  Alert and Lacking  Insight:  Lacking  Engagement in Group:  Engaged and Limited  Modes of Intervention:  Activity, Discussion, Education and Problem-solving  Summary of Progress/Problems:pt was able to obtain some problem solving skills and was appropriate and supportive to her peers.   Karel Jarvis 04/07/2014, 4:15 PM
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
## 2014-04-07 NOTE — Progress Notes (Signed)
The focus of this group is to help patients review their daily goal of treatment and discuss progress on daily workbooks. Pt shared that her goal today was to "get out of the room" (i.e. Decrease isolation) and to "work on anxiety." Pt shared that she accomplished both of these goals. Pt observed that socializing helped her to better deal with her anxiety.
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
## 2014-04-08 DIAGNOSIS — F331 Major depressive disorder, recurrent, moderate: Secondary | ICD-10-CM | POA: Insufficient documentation

## 2014-04-08 LAB — TSH: TSH: 3.56 u[IU]/mL (ref 0.350–4.500)

## 2014-04-08 MED ORDER — INFLUENZA VAC SPLIT QUAD 0.5 ML IM SUSY
0.5000 mL | PREFILLED_SYRINGE | INTRAMUSCULAR | Status: AC
  Administered 2014-04-08: 0.5 mL via INTRAMUSCULAR
  Filled 2014-04-08: qty 0.5

## 2014-04-08 MED ORDER — DULOXETINE HCL 60 MG PO CPEP: 60.0000 mg | ORAL_CAPSULE | Freq: Every day | ORAL | Status: DC

## 2014-04-08 MED ORDER — ALBUTEROL SULFATE HFA 108 (90 BASE) MCG/ACT IN AERS: 2.0000 | INHALATION_SPRAY | RESPIRATORY_TRACT | Status: DC | PRN

## 2014-04-08 MED ORDER — PNEUMOCOCCAL 13-VAL CONJ VACC IM SUSP
0.5000 mL | INTRAMUSCULAR | Status: DC
Start: 2014-04-09 — End: 2014-04-08
  Filled 2014-04-08: qty 0.5

## 2014-04-08 MED ORDER — GABAPENTIN 300 MG PO CAPS: 300.0000 mg | ORAL_CAPSULE | Freq: Every day | ORAL | Status: DC

## 2014-04-08 MED ORDER — ALPRAZOLAM 1 MG PO TABS: 1.0000 mg | ORAL_TABLET | Freq: Every day | ORAL | Status: DC

## 2014-04-08 MED ORDER — INFLUENZA VAC SPLIT QUAD 0.5 ML IM SUSY
0.5000 mL | PREFILLED_SYRINGE | INTRAMUSCULAR | Status: DC
Start: 2014-04-09 — End: 2014-04-08
  Filled 2014-04-08: qty 0.5

## 2014-04-08 MED ORDER — PNEUMOCOCCAL 13-VAL CONJ VACC IM SUSP
0.5000 mL | Freq: Once | INTRAMUSCULAR | Status: AC
  Administered 2014-04-08: 0.5 mL via INTRAMUSCULAR
  Filled 2014-04-08: qty 0.5

## 2014-04-08 MED ORDER — CYCLOBENZAPRINE HCL 10 MG PO TABS: 10.0000 mg | ORAL_TABLET | Freq: Three times a day (TID) | ORAL | Status: DC | PRN

## 2014-04-08 MED ORDER — PNEUMOCOCCAL VAC POLYVALENT 25 MCG/0.5ML IJ INJ
0.5000 mL | INJECTION | Freq: Once | INTRAMUSCULAR | Status: DC
  Filled 2014-04-08: qty 0.5

## 2014-04-08 NOTE — Discharge Summary (Signed)
Physician Discharge Summary Note  Patient:  Kylie [name] is an 43 y.o., female MRN:  [id number] DOB:  09/22/1970 Patient phone:  [phone] (home)  Patient address:   [place] [address],  Total Time spent with patient: 45 minutes  Date of Admission:  04/06/2014 Date of Discharge: 04/08/2014  Reason for Admission:  Alcohol abuse with intoxication Cocaine abuse with cocaine-induced mood disorder  Discharge Diagnoses: Active Problems:   Major depression, recurrent   GAD (generalized anxiety disorder)   Alcohol abuse with intoxication   Cocaine abuse with cocaine-induced mood disorder   Major depressive disorder, recurrent episode, moderate   Psychiatric Specialty Exam: Physical Exam  Psychiatric: She has a normal mood and affect. Her speech is normal and behavior is normal. Judgment and thought content normal. Cognition and memory are normal.    Review of Systems  Constitutional: Negative.   HENT: Negative.   Eyes: Negative.   Respiratory: Negative.   Cardiovascular: Negative.   Gastrointestinal: Negative.   Genitourinary: Negative.   Musculoskeletal: Negative.   Skin: Negative.   Neurological: Negative.   Endo/Heme/Allergies: Negative.   Psychiatric/Behavioral: Positive for depression (Hx of, chronic). Negative for suicidal ideas, hallucinations, memory loss and substance abuse. The patient is nervous/anxious (Hx of, chronic). The patient does not have insomnia.     Blood pressure 129/68, pulse 78, temperature 98.2 F (36.8 C), temperature source Oral, resp. rate 18, height 5\' 2"  (1.575 m), weight 85.276 kg (188 lb), last menstrual period 04/04/2014, SpO2 100 %.Body mass index is 34.38 kg/(m^2).    Past Psychiatric History: Diagnosis: Depression  Hospitalizations: Denies  Outpatient Care: Denies  Substance Abuse Care: Denies  Self-Mutilation: Denies  Suicidal Attempts: Denies  Violent Behaviors: Denies     Musculoskeletal: Strength & Muscle Tone: within normal limits Gait & Station: normal Patient leans: N/A  DSM5:  Schizophrenia Disorders:  NA Obsessive-Compulsive Disorders:  NA Trauma-Stressor Disorders:  NA Substance/Addictive Disorders:  Alcohol Intoxication with Use Disorder - Severe (F10.229) and Opioid Disorder - Mild (305.50) Depressive Disorders:  Major Depressive Disorder (296.99)  Axis Diagnosis:   AXIS I:  Generalized Anxiety Disorder, Major Depression, Recurrent severe, Substance Abuse and Substance Induced Mood Disorder AXIS II:  Deferred AXIS III:   Past Medical History  Diagnosis Date  . Hepatitis B infection   . Cervical cancer     Stage 2  . Degenerative disk disease   . Ovarian cyst   . Chronic UTI   . Anxiety   . Bipolar 1 disorder   . Panic attack    AXIS IV:  economic problems, occupational problems and other psychosocial or environmental problems AXIS V:  61-70 mild symptoms  Level of Care:  OP  Hospital Course:  Patient was brought in by [facility] after being called by patient's boyfriend. She was found with her long hair cut off and seemingly disorganized. She stated that she was at a party with friends and a "cup" was passed along. She drank from the cup and she stated that she thought was "laced with cocaine, because it showed up in my blood and I didn't do cocaine". Additionally, the GDP thought that patient overdosed on her Restoril tabs but patient states that they are safely in her pill box. Stated that she was diagnosed with bipolar depression, panic disorder, anxiety and agoraphobia.  Brendaly did well during her stay.  At time of discharge, she  rated both depression and anxiety levels to be manageable and minimal.  She  was able to identify the reason why she was broughtin by law enforcement.  She did well with the medications prescribed and resumed for her.  Denies physiological concerns/SI/HI/AVH at time of discharge.  She has satisfactory  support network and home environment and will adhere to medication compliance and outpatient treatment.     Consults:  psychiatry  Significant Diagnostic Studies:  labs: Per ED  Discharge Vitals:   Blood pressure 129/68, pulse 78, temperature 98.2 F (36.8 C), temperature source Oral, resp. rate 18, height 5\' 2"  (1.575 m), weight 85.276 kg (188 lb), last menstrual period 04/04/2014, SpO2 100 %. Body mass index is 34.38 kg/(m^2). Lab Results:   Results for orders placed or performed during the hospital encounter of 04/06/14 (from the past 72 hour(s))  TSH     Status: None   Collection Time: 04/08/14  6:43 AM  Result Value Ref Range   TSH 3.560 0.350 - 4.500 uIU/mL    Comment: Performed at [facility]    Physical Findings: AIMS: Facial and Oral Movements Muscles of Facial Expression: None, normal Lips and Perioral Area: None, normal Jaw: None, normal Tongue: None, normal,Extremity Movements Upper (arms, wrists, hands, fingers): None, normal Lower (legs, knees, ankles, toes): None, normal, Trunk Movements Neck, shoulders, hips: None, normal, Overall Severity Severity of abnormal movements (highest score from questions above): None, normal Incapacitation due to abnormal movements: None, normal Patient's awareness of abnormal movements (rate only patient's report): No Awareness, Dental Status Current problems with teeth and/or dentures?: No Does patient usually wear dentures?: No  CIWA:  CIWA-Ar Total: 2 COWS:  COWS Total Score: 3  Psychiatric Specialty Exam: See Psychiatric Specialty Exam and Suicide Risk Assessment completed by Attending Physician prior to discharge.  Discharge destination:  Home  Is patient on multiple antipsychotic therapies at discharge:  No   Has Patient had three or more failed trials of antipsychotic monotherapy by history:  No  Recommended Plan for Multiple Antipsychotic Therapies: NA     Medication List    STOP taking these medications         busPIRone 30 MG tablet  Commonly known as:  BUSPAR     ibuprofen 600 MG tablet  Commonly known as:  ADVIL,MOTRIN     naproxen 375 MG tablet  Commonly known as:  NAPROSYN     phenazopyridine 200 MG tablet  Commonly known as:  PYRIDIUM      TAKE these medications      Indication   albuterol 108 (90 BASE) MCG/ACT inhaler  Commonly known as:  PROVENTIL HFA;VENTOLIN HFA  Inhale 2 puffs into the lungs every 4 (four) hours as needed for wheezing.   Indication:  Asthma     ALPRAZolam 1 MG tablet  Commonly known as:  XANAX  Take 1 tablet (1 mg total) by mouth at bedtime.   Indication:  Feeling Anxious     cyclobenzaprine 10 MG tablet  Commonly known as:  FLEXERIL  Take 1 tablet (10 mg total) by mouth 3 (three) times daily as needed for muscle spasms.   Indication:  Muscle Spasm     DULoxetine 60 MG capsule  Commonly known as:  CYMBALTA  Take 1 capsule (60 mg total) by mouth daily.      DULoxetine 60 MG capsule  Commonly known as:  CYMBALTA  Take 1 capsule (60 mg total) by mouth daily.   Indication:  Major Depressive Disorder     gabapentin 300 MG capsule  Commonly known as:  NEURONTIN  Take 1 capsule (300 mg total) by mouth at bedtime.   Indication:  Pain         Follow-up recommendations:  Activity:  As tolerated Diet:  As tolerated  Comments:  1.  Take all your medications as prescribed.              2.  Report any adverse side effects to outpatient provider.                       3.  Patient instructed to not use alcohol or illegal drugs while on prescription medicines.            4.  In the event of worsening symptoms, instructed patient to call 911, the crisis hotline or go to nearest emergency room for evaluation of symptoms.  Total Discharge Time:  Greater than 30 minutes.  SignedKerrie Buffalo MAY, AGNP-BC 04/08/2014, 1:09 PM  I personally assessed the patient and formulated the plan Geralyn Flash A. Sabra Heck, M.D.

## 2014-04-08 NOTE — BHH Group Notes (Signed)
[place] Group Notes:  relaxation  Date:  04/08/2014  Time:  10:11 AM  Type of Therapy:  Nurse Education  Participation Level:  Active  Participation Quality:  Appropriate  Affect:  Appropriate  Cognitive:  Appropriate  Insight:  Appropriate  Engagement in Group:  Engaged  Modes of Intervention:  Education  Summary of Progress/Problems:Pt stated her three pillars that hold her up are her two children and her grandson  Kylie [name] 04/08/2014, 10:11 AM

## 2014-04-08 NOTE — Plan of Care (Signed)
Problem: Alteration in mood & ability to function due to Goal: STG-Patient will attend groups Outcome: Progressing Patient attended group this evening

## 2014-04-08 NOTE — Plan of Care (Signed)
Problem: Alteration in mood & ability to function due to Goal: LTG-Pt verbalizes understanding of importance of med regimen (Patient verbalizes understanding of importance of medication regimen and need to continue outpatient care and support groups)  Outcome: Progressing Patient is compliant with medications.

## 2014-04-08 NOTE — BHH Suicide Risk Assessment (Signed)
Suicide Risk Assessment  Discharge Assessment     Demographic Factors:  Caucasian  Total Time spent with patient: 45 minutes  Psychiatric Specialty Exam:     Blood pressure 129/68, pulse 78, temperature 98.2 F (36.8 C), temperature source Oral, resp. rate 18, height 5\' 2"  (1.575 m), weight 85.276 kg (188 lb), last menstrual period 04/04/2014, SpO2 100 %.Body mass index is 34.38 kg/(m^2).  General Appearance: Fairly Groomed  Engineer, water::  Fair  Speech:  Clear and Coherent  Volume:  Normal  Mood:  Euthymic  Affect:  Appropriate  Thought Process:  Coherent and Goal Directed  Orientation:  Full (Time, Place, and Person)  Thought Content:  plans as she moves on, relapse prenvetion plan  Suicidal Thoughts:  No  Homicidal Thoughts:  No  Memory:  Immediate;   Fair Recent;   Fair Remote;   Fair  Judgement:  Fair  Insight:  Present  Psychomotor Activity:  Normal  Concentration:  Fair  Recall:  [employer] of Knowledge:NA  Language: Fair  Akathisia:  No  Handed:    AIMS (if indicated):     Assets:  Desire for Improvement Housing Social Support  Sleep:  Number of Hours: 5.75    Musculoskeletal: Strength & Muscle Tone: within normal limits Gait & Station: normal Patient leans: N/A   Mental Status Per Nursing Assessment::   On Admission:  Self-harm behaviors  Current Mental Status by Physician: In full contact with reality. There are no SI plans or intent. States that what happened was out of character for her. She states an old friend came to town and she started drinking but states the alcohol felt differently, did not have the same effect on her. She " went crazy." Now she is back to her old self. States she has a lot of support at home with her boyfriend and his family and her kids. She states they are coming to see her this afternoon. They live far and she wants to leave with them when they come. She is willing to follow up outpatient basis   Loss  Factors: NA  Historical Factors: NA  Risk Reduction Factors:   Sense of responsibility to family, Living with another person, especially a relative and Positive social support  Continued Clinical Symptoms:  Severe Anxiety and/or Agitation Depression:   Comorbid alcohol abuse/dependence  Cognitive Features That Contribute To Risk:  Polarized thinking Thought constriction (tunnel vision)    Suicide Risk:  Minimal: No identifiable suicidal ideation.  Patients presenting with no risk factors but with morbid ruminations; may be classified as minimal risk based on the severity of the depressive symptoms  Discharge Diagnoses:   AXIS I:  Major Depression recurrent moderate, GAD, acute alcohol/cocaine intoxication Substance Induced Mood Disorder AXIS II:  No diagnosis AXIS III:   Past Medical History  Diagnosis Date  . Hepatitis B infection   . Cervical cancer     Stage 2  . Degenerative disk disease   . Ovarian cyst   . Chronic UTI   . Anxiety   . Bipolar 1 disorder   . Panic attack    AXIS IV:  other psychosocial or environmental problems AXIS V:  61-70 mild symptoms  Plan Of Care/Follow-up recommendations:  Activity:  as tolerated Diet:  regular Follow up outpatient basis Is patient on multiple antipsychotic therapies at discharge:  No   Has Patient had three or more failed trials of antipsychotic monotherapy by history:  No  Recommended Plan for Multiple Antipsychotic  Therapies: NA    Tim Wilhide A 04/08/2014, 12:50 PM

## 2014-04-08 NOTE — Progress Notes (Signed)
Writer has observed patient up in the dayroom laughing and talking with peers. Writer spoke with her 1:1 and she reports that er day started off rough but has become much better. She reports that she is glad her medications have been started which also helped with her increased anxiety. She denies si/hi/a/v hallucinations. Support and encouragement given, safety maintained on unit with 15 min check.

## 2014-04-08 NOTE — B[facility] Group Notes (Signed)
[facility] LCSW Group Therapy  04/08/2014 2:37 PM  Type of Therapy:  Group Therapy  Participation Level:  Active  Participation Quality:  Appropriate and Supportive  Affect:  Appropriate  Cognitive:  Appropriate  Insight:  Developing/Improving, Engaged and Supportive  Engagement in Therapy:  Developing/Improving, Engaged and Supportive  Modes of Intervention:  Discussion, Education, Exploration and Support  Summary of Progress/Problems: Pt was able to participate in group appropriately.  Pt was able to give examples of self-sabotaging behaviors and was able to support other discussion throughout group.    Katha Hamming 04/08/2014, 2:37 PM

## 2014-04-08 NOTE — BHH Group Notes (Signed)
[place] Group Notes:  Life skills  Date:  04/08/2014  Time:  12:05 PM  Type of Therapy:  Nurse Education  Participation Level:  Active  Participation Quality:  Appropriate  Affect:  Appropriate  Cognitive:  Alert  Insight:  Appropriate  Engagement in Group:  Engaged  Modes of Intervention:  Education  Summary of Progress/Problems:pt stated she is trying to develop positive coping skills  Delman Kitten 04/08/2014, 12:05 PM

## 2014-04-08 NOTE — Progress Notes (Signed)
Patient ID: DESREE LEAP, female   DOB: 1970/12/31, 43 y.o.   MRN: [id number]   D: Pt currently presents with an appropriate affect and anxious behavior. Per self inventory, pt rates depression at a 0, hopelessness 0 and anxiety 7. Pt's daily goal is to "To get home to my family" and they intend to do so by "everything possible." Pt reports pain in back and hip is at a 8 out of 10. Pt reports good sleep, fair appetite and good concentration.   A:Pt provided with medications per providers orders. Pt's labs and vitals were monitored throughout the day. Pt supported emotionally and encouraged to express concerns and questions. Pt consulted with provider and nurse about follow up with a psychiatrist. Pt educated on medication effects and managing anxiety.  R: Pt's safety ensured with 15 minute and environmental checks. Pt currently denies SI/HI and A/V hallucinations. Pt verbally agrees to seek staff of SI/HI or A/VH occurs and to consult with staff before acting on these thoughts. Pt reports that she is excited to go home.   Elenore Rota, RN

## 2014-04-08 NOTE — Progress Notes (Signed)
Patient ID: Kylie [name], female   DOB: 12-28-1970, 43 y.o.   MRN: [id number]   Pt discharged home with her boyfriend. Pt was stable and smiling. All papers and prescriptions were given and valuables returned. Verbal understanding expressed. Denies SI/HI and A/VH. Verbal confirmation and acceptance of follow up plans.   Elenore Rota, RN

## 2014-04-08 NOTE — Progress Notes (Signed)
Adult Psychoeducational Group Note  Date:  04/08/2014 Time:  4:21 PM  Group Topic/Focus: Therapeutic Activity    Participation Level:  Active  Participation Quality:  Appropriate  Affect:  Appropriate  Cognitive:  Appropriate  Insight: Appropriate  Engagement in Group:  Engaged  Modes of Intervention:  Activity  Additional Comments:  Pt attended group this afternoon. Pt participate in therapeutic ball activity group with peers. Pt was pleasant and appropriate in group.

## 2014-04-11 NOTE — Progress Notes (Signed)
Patient Discharge Instructions:  No documentation was faxed for [facility]IPS.  No follow up provider is on the AVS, no ROI is available.  Patsey Berthold, 04/11/2014, 3:11 PM

## 2014-05-01 ENCOUNTER — Other Ambulatory Visit (HOSPITAL_COMMUNITY): Payer: Self-pay | Admitting: Psychiatry

## 2014-08-29 ENCOUNTER — Other Ambulatory Visit: Payer: Self-pay | Admitting: *Deleted

## 2014-08-30 MED ORDER — CYCLOBENZAPRINE HCL 10 MG PO TABS: 10.0000 mg | ORAL_TABLET | Freq: Three times a day (TID) | ORAL | Status: DC | PRN

## 2014-08-30 NOTE — Telephone Encounter (Signed)
It has been over one year since patient seen.  Short term refill only until seen.

## 2014-09-12 ENCOUNTER — Telehealth: Payer: Self-pay | Admitting: Family Medicine

## 2014-09-12 NOTE — Telephone Encounter (Signed)
Patient states she is suppose to take Flexeril 3 x daily but she only received 30 tabs. Is requesting a corrected script be sent to pharmacy.

## 2014-09-13 NOTE — Telephone Encounter (Signed)
Denied refill.   Has not been seen since 11/14 - a year and a half.  Behind on health maint.  Needs appointment as was indicated on her last refill.  Tried to call and her number is disconnected.

## 2014-09-14 ENCOUNTER — Other Ambulatory Visit: Payer: Self-pay | Admitting: *Deleted

## 2014-09-14 NOTE — Telephone Encounter (Signed)
Received a voice message from [facility] requesting to change Flexeril quantity to #90. #90 would list patient 30 days as with the #30 lasting 10 days. Pt would have to pay three times co-pay for #30 and one time co-pay for #90.  Please advise.  Please call pharmacy at [phone].  Derl Barrow, RN

## 2014-09-14 NOTE — Telephone Encounter (Signed)
Called pharmacy and declined refill due to Dr. Lowella Bandy note. They will inform patient that she needs to make an appointment first  Call Documentation      Zenia Resides, MD at 09/13/2014 10:46 AM     Status: Signed       Expand All Collapse All   Denied refill. Has not been seen since 11/14 - a year and a half. Behind on health maint. Needs appointment as was indicated on her last refill. Tried to call and her number is disconnected.

## 2014-10-15 ENCOUNTER — Other Ambulatory Visit: Payer: Self-pay | Admitting: *Deleted

## 2014-10-15 NOTE — Telephone Encounter (Signed)
Denied.  See last telephone note.

## 2014-11-11 ENCOUNTER — Encounter (HOSPITAL_BASED_OUTPATIENT_CLINIC_OR_DEPARTMENT_OTHER): Payer: Self-pay | Admitting: Emergency Medicine

## 2014-11-11 ENCOUNTER — Emergency Department (HOSPITAL_BASED_OUTPATIENT_CLINIC_OR_DEPARTMENT_OTHER)
Admission: EM | Admit: 2014-11-11 | Discharge: 2014-11-11 | Disposition: A | Discharge status: Home / Self Care | Payer: Medicaid Other | Attending: Emergency Medicine | Admitting: Emergency Medicine

## 2014-11-11 DIAGNOSIS — R000 Tachycardia, unspecified: Secondary | ICD-10-CM | POA: Diagnosis not present

## 2014-11-11 DIAGNOSIS — Z8739 Personal history of other diseases of the musculoskeletal system and connective tissue: Secondary | ICD-10-CM | POA: Diagnosis not present

## 2014-11-11 DIAGNOSIS — F319 Bipolar disorder, unspecified: Secondary | ICD-10-CM | POA: Diagnosis not present

## 2014-11-11 DIAGNOSIS — Z720 Tobacco use: Secondary | ICD-10-CM | POA: Diagnosis not present

## 2014-11-11 DIAGNOSIS — N61 Inflammatory disorders of breast: Secondary | ICD-10-CM | POA: Insufficient documentation

## 2014-11-11 DIAGNOSIS — Z8619 Personal history of other infectious and parasitic diseases: Secondary | ICD-10-CM | POA: Insufficient documentation

## 2014-11-11 DIAGNOSIS — Z8541 Personal history of malignant neoplasm of cervix uteri: Secondary | ICD-10-CM | POA: Insufficient documentation

## 2014-11-11 DIAGNOSIS — F410 Panic disorder [episodic paroxysmal anxiety] without agoraphobia: Secondary | ICD-10-CM | POA: Diagnosis not present

## 2014-11-11 DIAGNOSIS — Z79899 Other long term (current) drug therapy: Secondary | ICD-10-CM | POA: Insufficient documentation

## 2014-11-11 DIAGNOSIS — N644 Mastodynia: Secondary | ICD-10-CM | POA: Diagnosis present

## 2014-11-11 LAB — CBC WITH DIFFERENTIAL/PLATELET
BAND NEUTROPHILS: 5 % (ref 0–10)
Basophils Absolute: 0 10*3/uL (ref 0.0–0.1)
Basophils Relative: 0 % (ref 0–1)
Blasts: 0 %
Eosinophils Absolute: 0 10*3/uL (ref 0.0–0.7)
Eosinophils Relative: 0 % (ref 0–5)
HCT: 42.4 % (ref 36.0–46.0)
Hemoglobin: 14 g/dL (ref 12.0–15.0)
LYMPHS ABS: 0.4 10*3/uL — AB (ref 0.7–4.0)
Lymphocytes Relative: 3 % — ABNORMAL LOW (ref 12–46)
MCH: 29.7 pg (ref 26.0–34.0)
MCHC: 33 g/dL (ref 30.0–36.0)
MCV: 90 fL (ref 78.0–100.0)
METAMYELOCYTES PCT: 0 %
Monocytes Absolute: 0.3 10*3/uL (ref 0.1–1.0)
Monocytes Relative: 2 % — ABNORMAL LOW (ref 3–12)
Myelocytes: 0 %
NRBC: 0 /100{WBCs}
Neutro Abs: 12.4 10*3/uL — ABNORMAL HIGH (ref 1.7–7.7)
Neutrophils Relative %: 90 % — ABNORMAL HIGH (ref 43–77)
PROMYELOCYTES ABS: 0 %
Platelets: 165 10*3/uL (ref 150–400)
RBC: 4.71 MIL/uL (ref 3.87–5.11)
RDW: 13.6 % (ref 11.5–15.5)
WBC: 13.1 10*3/uL — ABNORMAL HIGH (ref 4.0–10.5)

## 2014-11-11 LAB — BASIC METABOLIC PANEL
ANION GAP: 5 (ref 5–15)
BUN: 7 mg/dL (ref 6–20)
CHLORIDE: 103 mmol/L (ref 101–111)
CO2: 23 mmol/L (ref 22–32)
Calcium: 8.4 mg/dL — ABNORMAL LOW (ref 8.9–10.3)
Creatinine, Ser: 0.86 mg/dL (ref 0.44–1.00)
GFR calc Af Amer: >60 mL/min (ref >60)
GFR calc non Af Amer: >60 mL/min (ref >60)
Glucose, Bld: 116 mg/dL — ABNORMAL HIGH (ref 65–99)
Potassium: 3.4 mmol/L — ABNORMAL LOW (ref 3.5–5.1)
Sodium: 131 mmol/L — ABNORMAL LOW (ref 135–145)

## 2014-11-11 LAB — I-STAT CG4 LACTIC ACID, ED: LACTIC ACID, VENOUS: 1.41 mmol/L (ref 0.5–2.0)

## 2014-11-11 MED ORDER — ACETAMINOPHEN 325 MG PO TABS
650.0000 mg | ORAL_TABLET | Freq: Once | ORAL | Status: AC
  Administered 2014-11-11: 650 mg via ORAL
  Filled 2014-11-11: qty 2

## 2014-11-11 MED ORDER — MORPHINE SULFATE 4 MG/ML IJ SOLN
4.0000 mg | Freq: Once | INTRAMUSCULAR | Status: DC
  Filled 2014-11-11: qty 1

## 2014-11-11 MED ORDER — SODIUM CHLORIDE 0.9 % IV BOLUS (SEPSIS): 1000.0000 mL | Freq: Once | INTRAVENOUS | Status: DC

## 2014-11-11 MED ORDER — SULFAMETHOXAZOLE-TRIMETHOPRIM 800-160 MG PO TABS: 1.0000 | ORAL_TABLET | Freq: Two times a day (BID) | ORAL | Status: DC

## 2014-11-11 MED ORDER — CEPHALEXIN 500 MG PO CAPS: 500.0000 mg | ORAL_CAPSULE | Freq: Four times a day (QID) | ORAL | Status: DC

## 2014-11-11 MED ORDER — MORPHINE SULFATE 4 MG/ML IJ SOLN
4.0000 mg | Freq: Once | INTRAMUSCULAR | Status: AC
  Administered 2014-11-11: 4 mg via INTRAMUSCULAR
  Filled 2014-11-11: qty 1

## 2014-11-11 MED ORDER — TRAMADOL HCL 50 MG PO TABS
50.0000 mg | ORAL_TABLET | Freq: Four times a day (QID) | ORAL | Status: DC | PRN
Start: 2014-11-11 — End: 2015-04-11

## 2014-11-11 MED ORDER — SODIUM CHLORIDE 0.9 % IV BOLUS (SEPSIS): 1000.0000 mL | INTRAVENOUS | Status: DC

## 2014-11-11 MED ORDER — VANCOMYCIN HCL IN DEXTROSE 1-5 GM/200ML-% IV SOLN
1000.0000 mg | Freq: Once | INTRAVENOUS | Status: DC
  Filled 2014-11-11: qty 200

## 2014-11-11 MED ORDER — PIPERACILLIN-TAZOBACTAM 3.375 G IVPB 30 MIN
3.3750 g | Freq: Once | INTRAVENOUS | Status: DC
  Filled 2014-11-11 (×2): qty 50

## 2014-11-11 MED ORDER — ONDANSETRON HCL 4 MG/2ML IJ SOLN
4.0000 mg | Freq: Once | INTRAMUSCULAR | Status: DC
  Filled 2014-11-11: qty 2

## 2014-11-11 MED ORDER — ONDANSETRON 4 MG PO TBDP
4.0000 mg | ORAL_TABLET | Freq: Once | ORAL | Status: AC
  Administered 2014-11-11: 4 mg via ORAL
  Filled 2014-11-11: qty 1

## 2014-11-11 MED ORDER — LIDOCAINE HCL (PF) 1 % IJ SOLN
INTRAMUSCULAR | Status: AC
  Administered 2014-11-11: 2.1 mL
  Filled 2014-11-11: qty 5

## 2014-11-11 MED ORDER — CEFTRIAXONE SODIUM 1 G IJ SOLR
1.0000 g | Freq: Once | INTRAMUSCULAR | Status: AC
  Administered 2014-11-11: 1 g via INTRAMUSCULAR
  Filled 2014-11-11: qty 10

## 2014-11-11 NOTE — ED Provider Notes (Signed)
CSN: [id number]     Arrival date & time 11/11/14  1430 History   First MD Initiated Contact with Patient 11/11/14 1517     Chief Complaint  Patient presents with  . Breast Pain  . Cellulitis     (Consider location/radiation/quality/duration/timing/severity/associated sxs/prior Treatment) HPI  Kylie [name] is a 44 y.o. female complaining of painful redness to right breast worsening over the course of 4 days associated with multiple episodes of nausea and vomiting today and fever with MAXIMUM TEMPERATURE of 104.0 this morning at 11:30. Patient had ibuprofen at that time. She does not recall any trauma to the breast, no purulent discharge. Denies DM, chronic steroids or immunocompromise. She is concerned about breast cancer, runs in her family. Is one year behind on her mammogram. She had an episode of left breast cellulitis several years ago. She rates her pain at 9 out of 10, exacerbated by movement and palpation only minimally relieved with ibuprofen.  Past Medical History  Diagnosis Date  . Hepatitis B infection   . Cervical cancer     Stage 2  . Degenerative disk disease   . Ovarian cyst   . Chronic UTI   . Anxiety   . Bipolar 1 disorder   . Panic attack    Past Surgical History  Procedure Laterality Date  . Tubal ligation    . Mandible fracture surgery    . Wisdom tooth extraction    . Cervix surgery     History reviewed. No pertinent family history. History  Substance Use Topics  . Smoking status: Current Every Day Smoker -- 1.00 packs/day for 25 years    Types: Cigarettes  . Smokeless tobacco: Never Used  . Alcohol Use: No   OB History    No data available     Review of Systems  10 systems reviewed and found to be negative, except as noted in the HPI.  Allergies  Citalopram; Hydrocodone; Ibuprofen; Naproxen; and Povidone-iodine  Home Medications   Prior to Admission medications   Medication Sig Start Date End Date Taking? Authorizing Provider    albuterol (PROVENTIL HFA;VENTOLIN HFA) 108 (90 BASE) MCG/ACT inhaler Inhale 2 puffs into the lungs every 4 (four) hours as needed for wheezing. 04/08/14   Kerrie Buffalo, NP  cephALEXin (KEFLEX) 500 MG capsule Take 1 capsule (500 mg total) by mouth 4 (four) times daily. 11/11/14   Hildegard Hlavac, PA-C  cyclobenzaprine (FLEXERIL) 10 MG tablet Take 1 tablet (10 mg total) by mouth 3 (three) times daily as needed for muscle spasms. 08/30/14   Zenia Resides, MD  DULoxetine (CYMBALTA) 60 MG capsule Take 1 capsule (60 mg total) by mouth daily. 04/08/14   Kerrie Buffalo, NP  gabapentin (NEURONTIN) 300 MG capsule Take 1 capsule (300 mg total) by mouth at bedtime. 04/08/14   Kerrie Buffalo, NP  sulfamethoxazole-trimethoprim (BACTRIM DS) 800-160 MG per tablet Take 1 tablet by mouth 2 (two) times daily. 11/11/14   Temeka Pore, PA-C  traMADol (ULTRAM) 50 MG tablet Take 1 tablet (50 mg total) by mouth every 6 (six) hours as needed. 11/11/14   Lashanti Chambless, PA-C   BP 113/52 mmHg  Pulse 104  Temp(Src) 100.1 F (37.8 C) (Oral)  Resp 20  Ht 5\' 7"  (1.702 m)  Wt 200 lb (90.719 kg)  BMI 31.32 kg/m2  SpO2 96%  LMP 09/23/2014 Physical Exam  Constitutional: She is oriented to person, place, and time. She appears well-developed and well-nourished. No distress.  Tearful in pain  HENT:  Head: Normocephalic.  Mouth/Throat: Oropharynx is clear and moist.  Eyes: Conjunctivae and EOM are normal.  Neck: Normal range of motion.  Cardiovascular: Regular rhythm, normal heart sounds and intact distal pulses.   Tachycardic  Pulmonary/Chest: Effort normal and breath sounds normal. No stridor. No respiratory distress. She has no rales. She exhibits no tenderness.    Abdominal: Soft.  Musculoskeletal: Normal range of motion.  Neurological: She is alert and oriented to person, place, and time.  Skin: Rash noted.  Cellulitis over the majority of the superior right breast. No focal fluctuance, no discharge. Warm  and diffusely exquisitely tender to palpation.  Psychiatric: She has a normal mood and affect.  Nursing note and vitals reviewed.   ED Course  Procedures (including critical care time) Labs Review Labs Reviewed  CBC WITH DIFFERENTIAL/PLATELET - Abnormal; Notable for the following:    WBC 13.1 (*)    Neutrophils Relative % 90 (*)    Lymphocytes Relative 3 (*)    Monocytes Relative 2 (*)    Neutro Abs 12.4 (*)    Lymphs Abs 0.4 (*)    All other components within normal limits  BASIC METABOLIC PANEL - Abnormal; Notable for the following:    Sodium 131 (*)    Potassium 3.4 (*)    Glucose, Bld 116 (*)    Calcium 8.4 (*)    All other components within normal limits  CULTURE, BLOOD (ROUTINE X 2)  CULTURE, BLOOD (ROUTINE X 2)  I-STAT CG4 LACTIC ACID, ED  I-STAT CG4 LACTIC ACID, ED    Imaging Review No results found.   EKG Interpretation None      MDM   Final diagnoses:  Cellulitis of female breast    Filed Vitals:   11/11/14 1444 11/11/14 1747  BP: 126/74 113/52  Pulse: 112 104  Temp: 100.1 F (37.8 C)   TempSrc: Oral   Resp: 20 20  Height: 5\' 7"  (1.702 m)   Weight: 200 lb (90.719 kg)   SpO2: 100% 96%    Medications  ondansetron (ZOFRAN) injection 4 mg (4 mg Intravenous Not Given 11/11/14 1733)  vancomycin (VANCOCIN) IVPB 1000 mg/200 mL premix (1,000 mg Intravenous Not Given 11/11/14 1735)  acetaminophen (TYLENOL) tablet 650 mg (650 mg Oral Given 11/11/14 1618)  cefTRIAXone (ROCEPHIN) injection 1 g (1 g Intramuscular Given 11/11/14 1732)  ondansetron (ZOFRAN-ODT) disintegrating tablet 4 mg (4 mg Oral Given 11/11/14 1728)  morphine 4 MG/ML injection 4 mg (4 mg Intramuscular Given 11/11/14 1729)  lidocaine (PF) (XYLOCAINE) 1 % injection (2.1 mLs  Given 11/11/14 1732)    Kylie [name] is a pleasant 44 y.o. female presenting with right breast cellulitis febrile and tachycardic. Blood pressure remained strong, blood culture is ordered, difficulty obtaining IV after  multiple t tries, discussed with attending physician Dr. Audie Pinto who recommends IM medication. Patient has normal lactic acid, she will be given Rocephin IM. Mild leukocytosis.  RN set file of 4 mg of morphine 4 mg of Zofran in the room while we were attempting IV access. On reentry to the room the Zofran is on the table however the morphine is missing. Discussed with charge nurse and attending. Have considered calling PD, patient is has guests in the room. We have decided not to involve law enforcement.   Discussed with patient return precautions. Area of cellulitis is outlined and she is instructed to return to ED for significantly rapidly spreading infection, vomiting. Discussed anti-pyretic and importance of implants with antibiotics.  Evaluation does not  show pathology that would require ongoing emergent intervention or inpatient treatment. Pt is hemodynamically stable and mentating appropriately. Discussed findings and plan with patient/guardian, who agrees with care plan. All questions answered. Return precautions discussed and outpatient follow up given.   New Prescriptions   CEPHALEXIN (KEFLEX) 500 MG CAPSULE    Take 1 capsule (500 mg total) by mouth 4 (four) times daily.   SULFAMETHOXAZOLE-TRIMETHOPRIM (BACTRIM DS) 800-160 MG PER TABLET    Take 1 tablet by mouth 2 (two) times daily.   TRAMADOL (ULTRAM) 50 MG TABLET    Take 1 tablet (50 mg total) by mouth every 6 (six) hours as needed.         Monico Blitz, PA-C 11/11/14 1806  Leonard Schwartz, MD 11/11/14 2056

## 2014-11-11 NOTE — ED Notes (Addendum)
Pt in c/o cellulitis to R breast, erythema and hardness encompassing entire top half of breast. Pt is tearful in triage. Skin intact.

## 2014-11-11 NOTE — ED Notes (Signed)
Elmyra Ricks Pisciotta, PA-C, notified that we have had 6 unsuccessful attempts at IV placement by several personnel members. Connye Burkitt, RN

## 2014-11-11 NOTE — Discharge Instructions (Signed)
For pain control you may take:  800mg  of ibuprofen (that is usually 4 over the counter pills)  3 times a day (take with food) and acetaminophen 975mg  (this is 3 over the counter pills) four times a day. Do not drink alcohol or combine with other medications that have acetaminophen as an ingredient (Read the labels!).    Please follow with your primary care doctor in the next 2 days for a check-up. They must obtain records for further management.   Do not hesitate to return to the Emergency Department for any new, worsening or concerning symptoms.    Cellulitis Cellulitis is an infection of the skin and the tissue beneath it. The infected area is usually red and tender. Cellulitis occurs most often in the arms and lower legs.  CAUSES  Cellulitis is caused by bacteria that enter the skin through cracks or cuts in the skin. The most common types of bacteria that cause cellulitis are staphylococci and streptococci. SIGNS AND SYMPTOMS   Redness and warmth.  Swelling.  Tenderness or pain.  Fever. DIAGNOSIS  Your health care provider can usually determine what is wrong based on a physical exam. Blood tests may also be done. TREATMENT  Treatment usually involves taking an antibiotic medicine. HOME CARE INSTRUCTIONS   Take your antibiotic medicine as directed by your health care provider. Finish the antibiotic even if you start to feel better.  Keep the infected arm or leg elevated to reduce swelling.  Apply a warm cloth to the affected area up to 4 times per day to relieve pain.  Take medicines only as directed by your health care provider.  Keep all follow-up visits as directed by your health care provider. SEEK MEDICAL CARE IF:   You notice red streaks coming from the infected area.  Your red area gets larger or turns dark in color.  Your bone or joint underneath the infected area becomes painful after the skin has healed.  Your infection returns in the same area or another  area.  You notice a swollen bump in the infected area.  You develop new symptoms.  You have a fever. SEEK IMMEDIATE MEDICAL CARE IF:   You feel very sleepy.  You develop vomiting or diarrhea.  You have a general ill feeling (malaise) with muscle aches and pains. MAKE SURE YOU:   Understand these instructions.  Will watch your condition.  Will get help right away if you are not doing well or get worse. Document Released: 02/25/2005 Document Revised: 10/02/2013 Document Reviewed: 08/03/2011 [facility] Patient Information 2015 [place], [place]. This information is not intended to replace advice given to you by your health care provider. Make sure you discuss any questions you have with your health care provider.

## 2015-04-11 ENCOUNTER — Ambulatory Visit (INDEPENDENT_AMBULATORY_CARE_PROVIDER_SITE_OTHER): Payer: Medicaid Other | Admitting: Family Medicine

## 2015-04-11 ENCOUNTER — Encounter: Payer: Self-pay | Admitting: Family Medicine

## 2015-04-11 VITALS — BP 122/88 | HR 91 | Temp 98.3°F | Ht 67.0 in | Wt 190.0 lb

## 2015-04-11 DIAGNOSIS — Z23 Encounter for immunization: Secondary | ICD-10-CM

## 2015-04-11 DIAGNOSIS — F1910 Other psychoactive substance abuse, uncomplicated: Secondary | ICD-10-CM

## 2015-04-11 DIAGNOSIS — M545 Low back pain: Secondary | ICD-10-CM

## 2015-04-11 DIAGNOSIS — F3160 Bipolar disorder, current episode mixed, unspecified: Secondary | ICD-10-CM

## 2015-04-11 DIAGNOSIS — F1911 Other psychoactive substance abuse, in remission: Secondary | ICD-10-CM

## 2015-04-11 DIAGNOSIS — G8929 Other chronic pain: Secondary | ICD-10-CM | POA: Diagnosis not present

## 2015-04-11 MED ORDER — CYCLOBENZAPRINE HCL 10 MG PO TABS
10.0000 mg | ORAL_TABLET | Freq: Three times a day (TID) | ORAL | Status: DC | PRN
Start: 2015-04-11 — End: 2015-10-02

## 2015-04-11 MED ORDER — LITHIUM CARBONATE 300 MG PO TABS: 300.0000 mg | ORAL_TABLET | Freq: Two times a day (BID) | ORAL | Status: DC

## 2015-04-11 MED ORDER — GABAPENTIN 300 MG PO CAPS: 300.0000 mg | ORAL_CAPSULE | Freq: Every day | ORAL | Status: DC

## 2015-04-11 NOTE — Assessment & Plan Note (Signed)
No controled substances

## 2015-04-11 NOTE — Assessment & Plan Note (Addendum)
Start on mood stabilizer.  Not depressed today.  More on the hypomanic/anxiety spectrum.  Lithium because family members have responded well in the past.

## 2015-04-11 NOTE — Patient Instructions (Signed)
I am glad you are finally getting your act together.  I am proud of you. We will start you on lithium.  It may take a while to get the dose right. See me in one month and we will continue to adjust the meds.

## 2015-04-12 NOTE — Progress Notes (Signed)
   Subjective:    Patient ID: Kylie [name], female    DOB: 1970/08/02, 44 y.o.   MRN: [id number]  HPI Well, perhaps Kylie [name] has finally seen the light.  She has been a wild child and done her fair share of drug use.  She has watched her mom died of an unintentional OD from heroin after years of narcotic seeking.  More recently, her mom's sister died of an unintentional heroin OD.  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    She is behind on health maint.  Today, she comes in seeking treatment for her bipolar disorder.  Specifically, she would like to try lithium, which was the best medicine I found in treating her mother's bipolar.  She has not done well on plain antidepressants in the past.  She is mostly hyper these days but does have profound mood swings.  No HI or SI.  She states she is committed to getting her life together.     Review of Systems     Objective:   Physical Exam Kylie [name] is anxious and a bit pressured today.  Still, for the first time she is not begging for pain meds or angry with me because I won't prescribe.  She is coming to me as an old family friend hoping to get help. Lungs clear Cardiac RRR without m or g        Assessment & Plan:

## 2015-06-05 ENCOUNTER — Ambulatory Visit: Payer: Medicaid Other | Admitting: Family Medicine

## 2015-06-19 ENCOUNTER — Ambulatory Visit: Payer: Self-pay | Admitting: Family Medicine

## 2015-06-24 ENCOUNTER — Telehealth: Payer: Self-pay | Admitting: Family Medicine

## 2015-06-24 DIAGNOSIS — M545 Low back pain, unspecified: Secondary | ICD-10-CM

## 2015-06-24 DIAGNOSIS — G8929 Other chronic pain: Secondary | ICD-10-CM

## 2015-06-24 MED ORDER — GABAPENTIN 300 MG PO CAPS: 300.0000 mg | ORAL_CAPSULE | Freq: Two times a day (BID) | ORAL | Status: DC

## 2015-06-24 NOTE — Telephone Encounter (Signed)
Pt called because she said she gets 180 qty of her gabapentin and she takes this a lot more than 1 time a day. She said her other doctor gave her a lot more and she want's to speak to Dr. Andria Frames so that he can change the prescription he called in 04/2015, she had not picked it up till now since she had so many from the other doctor. Please call her once the prescription has been updated. jw

## 2015-06-24 NOTE — Telephone Encounter (Signed)
LM on patient phone that she can use bid

## 2015-07-05 ENCOUNTER — Other Ambulatory Visit: Payer: Self-pay | Admitting: Family Medicine

## 2015-07-05 DIAGNOSIS — F3160 Bipolar disorder, current episode mixed, unspecified: Secondary | ICD-10-CM

## 2015-07-05 NOTE — Telephone Encounter (Signed)
Has been without lithium for 2 days. She has misplaced her bottle.  Needs it refilled now

## 2015-07-08 MED ORDER — LITHIUM CARBONATE 300 MG PO TABS: 300.0000 mg | ORAL_TABLET | Freq: Two times a day (BID) | ORAL | Status: DC

## 2015-07-08 NOTE — Telephone Encounter (Signed)
LVM for pt to call the office. If calls please inform her that the refill was sent and that she needs to make an appointment in the next month or so. Ottis Stain, CMA

## 2015-08-20 ENCOUNTER — Telehealth: Payer: Self-pay | Admitting: *Deleted

## 2015-08-20 NOTE — Telephone Encounter (Signed)
Apparently physician at methadone clinic has suggested that she be started baclofen, seroguel and vistaril.  These are low risk meds that I am likely willing to prescribe.  Also needs a Pap smear.  Also complains of constipation.  Suggested that she make an appointment at her earliest convenience.

## 2015-08-20 NOTE — Telephone Encounter (Signed)
Pt states that her MD at the [facility]one Clinic wants her to discuss starting a medication with Dr. Andria Frames. She was not comfortable with telling me the name of the medication, she is requesting a callback from Dr. Andria Frames. Fleeger, Salome Spotted, CMA

## 2015-08-28 ENCOUNTER — Ambulatory Visit: Payer: Self-pay | Admitting: Family Medicine

## 2015-09-05 ENCOUNTER — Ambulatory Visit: Payer: Self-pay | Admitting: Family Medicine

## 2015-10-02 ENCOUNTER — Ambulatory Visit (INDEPENDENT_AMBULATORY_CARE_PROVIDER_SITE_OTHER): Payer: Medicaid Other | Admitting: Family Medicine

## 2015-10-02 ENCOUNTER — Other Ambulatory Visit (HOSPITAL_COMMUNITY)
Admission: RE | Admit: 2015-10-02 | Discharge: 2015-10-02 | Disposition: A | Discharge status: Home / Self Care | Payer: Medicaid Other | Source: Ambulatory Visit | Attending: Family Medicine | Admitting: Family Medicine

## 2015-10-02 ENCOUNTER — Encounter: Payer: Self-pay | Admitting: Family Medicine

## 2015-10-02 VITALS — BP 132/75 | HR 90 | Temp 98.4°F | Ht 67.0 in | Wt 216.0 lb

## 2015-10-02 DIAGNOSIS — Z8742 Personal history of other diseases of the female genital tract: Secondary | ICD-10-CM

## 2015-10-02 DIAGNOSIS — F3160 Bipolar disorder, current episode mixed, unspecified: Secondary | ICD-10-CM | POA: Diagnosis not present

## 2015-10-02 DIAGNOSIS — E669 Obesity, unspecified: Secondary | ICD-10-CM

## 2015-10-02 DIAGNOSIS — M545 Low back pain: Secondary | ICD-10-CM

## 2015-10-02 DIAGNOSIS — Z01419 Encounter for gynecological examination (general) (routine) without abnormal findings: Secondary | ICD-10-CM | POA: Insufficient documentation

## 2015-10-02 DIAGNOSIS — N393 Stress incontinence (female) (male): Secondary | ICD-10-CM

## 2015-10-02 DIAGNOSIS — Z124 Encounter for screening for malignant neoplasm of cervix: Secondary | ICD-10-CM

## 2015-10-02 DIAGNOSIS — Z1151 Encounter for screening for human papillomavirus (HPV): Secondary | ICD-10-CM | POA: Insufficient documentation

## 2015-10-02 DIAGNOSIS — F1910 Other psychoactive substance abuse, uncomplicated: Secondary | ICD-10-CM | POA: Diagnosis not present

## 2015-10-02 DIAGNOSIS — F1911 Other psychoactive substance abuse, in remission: Secondary | ICD-10-CM

## 2015-10-02 DIAGNOSIS — G8929 Other chronic pain: Secondary | ICD-10-CM

## 2015-10-02 LAB — POCT URINALYSIS DIPSTICK
BILIRUBIN UA: NEGATIVE
GLUCOSE UA: NEGATIVE
Ketones, UA: NEGATIVE
Leukocytes, UA: NEGATIVE
Nitrite, UA: NEGATIVE
Protein, UA: NEGATIVE
Spec Grav, UA: 1.01
Urobilinogen, UA: 0.2
pH, UA: 6

## 2015-10-02 LAB — CBC
HEMATOCRIT: 39.7 % (ref 35.0–45.0)
Hemoglobin: 13.2 g/dL (ref 11.7–15.5)
MCH: 29.9 pg (ref 27.0–33.0)
MCHC: 33.2 g/dL (ref 32.0–36.0)
MCV: 89.8 fL (ref 80.0–100.0)
MPV: 9.8 fL (ref 7.5–12.5)
Platelets: 290 10*3/uL (ref 140–400)
RBC: 4.42 MIL/uL (ref 3.80–5.10)
RDW: 15 % (ref 11.0–15.0)
WBC: 9.3 10*3/uL (ref 3.8–10.8)

## 2015-10-02 LAB — COMPLETE METABOLIC PANEL WITH GFR
ALBUMIN: 4 g/dL (ref 3.6–5.1)
ALK PHOS: 79 U/L (ref 33–115)
ALT: 8 U/L (ref 6–29)
AST: 11 U/L (ref 10–30)
BUN: 8 mg/dL (ref 7–25)
CO2: 25 mmol/L (ref 20–31)
CREATININE: 0.71 mg/dL (ref 0.50–1.10)
Calcium: 9.4 mg/dL (ref 8.6–10.2)
Chloride: 106 mmol/L (ref 98–110)
GFR, Est African American: >89 mL/min (ref >=60)
GFR, Est Non African American: >89 mL/min (ref >=60)
Glucose, Bld: 78 mg/dL (ref 65–99)
POTASSIUM: 4.3 mmol/L (ref 3.5–5.3)
Sodium: 138 mmol/L (ref 135–146)
Total Bilirubin: 0.2 mg/dL (ref 0.2–1.2)
Total Protein: 6.8 g/dL (ref 6.1–8.1)

## 2015-10-02 LAB — TSH: TSH: 7.61 mIU/L — ABNORMAL HIGH

## 2015-10-02 LAB — POCT GLYCOSYLATED HEMOGLOBIN (HGB A1C): Hemoglobin A1C: 5.3

## 2015-10-02 MED ORDER — QUETIAPINE FUMARATE 300 MG PO TABS: 300.0000 mg | ORAL_TABLET | Freq: Every day | ORAL | Status: DC

## 2015-10-02 MED ORDER — BACLOFEN 10 MG PO TABS
10.0000 mg | ORAL_TABLET | Freq: Three times a day (TID) | ORAL | Status: DC
Start: 2015-10-02 — End: 2015-12-05

## 2015-10-02 MED ORDER — HYDROXYZINE PAMOATE 25 MG PO CAPS: 25.0000 mg | ORAL_CAPSULE | Freq: Three times a day (TID) | ORAL | Status: DC | PRN

## 2015-10-02 NOTE — Patient Instructions (Signed)
I will call with Pap and blood test results. I already know that you show no evidence of diabetes. I sent in the three drugs you counselor recommends. Stop the flexeril - the baclofen takes its place See me in three months to see how the new medications are working. One of the blood tests I did is a lithium level - I will adjust the dose of lithium based on that level. The obesity is contributing to several of you problems.  It would be extremely helpful for you to lose weight. You have urinary stress incontinence.  It is caused by a combination of weak muscles and being overweight.  I want you to do Kegel exercises to strengthen your muscles.

## 2015-10-03 DIAGNOSIS — Z8742 Personal history of other diseases of the female genital tract: Secondary | ICD-10-CM | POA: Insufficient documentation

## 2015-10-03 LAB — LITHIUM LEVEL: Lithium Lvl: 0.7 mEq/L — ABNORMAL LOW (ref 0.80–1.40)

## 2015-10-03 NOTE — Assessment & Plan Note (Signed)
Contributing to back pain and stress incontinence.

## 2015-10-03 NOTE — Assessment & Plan Note (Signed)
Begin Kegel exercises.

## 2015-10-03 NOTE — Assessment & Plan Note (Signed)
Check lithium level and TSH.  Add seroquel and vistaril following methadone MD suggestion.

## 2015-10-03 NOTE — Assessment & Plan Note (Signed)
Repeat pap today

## 2015-10-03 NOTE — Progress Notes (Signed)
   Subjective:    Patient ID: Kylie [name], female    DOB: 08-02-1970, 45 y.o.   MRN: [id number]  HPI  Here for follow up abnormal Pap and well woman exam.  Issues 1. Previous abnormal pap.  Due.  Single sexual partner.  Not at risk for STD.   2. Craving sweets.  FHx of DM Wants to be checked for DM 3. Desires to start mammogram screening in 40's.  Given information on scheduling. 4. 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 5. Bipolar disease.  Less than ideally controled on lithium.  Need to check level and TSH.  Methadone counseling physician has suggested seroquel, vistaril and baclofen 6. Chronic back pain is stable. 7. Significant issues with stress incontinence.  Clear history of incontinence with cough, sneeze or laugh.     Review of Systems     Objective:   Physical Exam Abd bening Pelvic WNL, Pap smear taken. Anxious but oriented and cooperative in the room.        Assessment & Plan:

## 2015-10-03 NOTE — Assessment & Plan Note (Signed)
DC flexeril and start baclofen per methadone clinic MD suggestion.

## 2015-10-07 ENCOUNTER — Telehealth: Payer: Self-pay | Admitting: Family Medicine

## 2015-10-07 LAB — CYTOLOGY - PAP

## 2015-10-07 NOTE — Telephone Encounter (Signed)
I have tried calling on multiple occasions and have left several messages.  At this point, I will wait for her to call me back.

## 2015-10-23 ENCOUNTER — Other Ambulatory Visit: Payer: Self-pay | Admitting: *Deleted

## 2015-10-23 DIAGNOSIS — F3160 Bipolar disorder, current episode mixed, unspecified: Secondary | ICD-10-CM

## 2015-10-23 MED ORDER — LITHIUM CARBONATE 300 MG PO TABS: 300.0000 mg | ORAL_TABLET | Freq: Two times a day (BID) | ORAL | Status: DC

## 2015-11-18 ENCOUNTER — Telehealth: Payer: Self-pay | Admitting: Family Medicine

## 2015-11-18 DIAGNOSIS — M545 Low back pain: Principal | ICD-10-CM

## 2015-11-18 DIAGNOSIS — G8929 Other chronic pain: Secondary | ICD-10-CM

## 2015-11-18 MED ORDER — QUETIAPINE FUMARATE ER 300 MG PO TB24: 300.0000 mg | ORAL_TABLET | Freq: Every day | ORAL | Status: DC

## 2015-11-18 MED ORDER — GABAPENTIN 300 MG PO CAPS: 300.0000 mg | ORAL_CAPSULE | Freq: Three times a day (TID) | ORAL | Status: DC

## 2015-11-18 MED ORDER — LEVOTHYROXINE SODIUM 50 MCG PO TABS: 50.0000 ug | ORAL_TABLET | Freq: Every day | ORAL | Status: DC

## 2015-11-18 NOTE — Telephone Encounter (Signed)
OK to switch to seroquel XR Started on levothyroxine due to increased TSH Increased gabapentin to tid FU with me six weeks.

## 2015-11-18 NOTE — Telephone Encounter (Signed)
Pt wants Dr Andria Frames to call. Drs at [facility] clinic want her change Seroquel to extended release.  Pt also thinks she needs to increase her lithium.

## 2015-11-18 NOTE — Telephone Encounter (Signed)
Will forward to MD to advise. Jazmin Hartsell,CMA  

## 2015-11-23 ENCOUNTER — Emergency Department (HOSPITAL_BASED_OUTPATIENT_CLINIC_OR_DEPARTMENT_OTHER): Payer: Medicaid Other

## 2015-11-23 ENCOUNTER — Inpatient Hospital Stay (HOSPITAL_BASED_OUTPATIENT_CLINIC_OR_DEPARTMENT_OTHER)
Admission: EM | Admit: 2015-11-23 | Discharge: 2015-12-05 | DRG: 163 | Disposition: A | Discharge status: Home / Self Care | Payer: Medicaid Other | Attending: Family Medicine | Admitting: Family Medicine

## 2015-11-23 ENCOUNTER — Encounter (HOSPITAL_BASED_OUTPATIENT_CLINIC_OR_DEPARTMENT_OTHER): Payer: Self-pay | Admitting: *Deleted

## 2015-11-23 DIAGNOSIS — R0602 Shortness of breath: Secondary | ICD-10-CM | POA: Diagnosis present

## 2015-11-23 DIAGNOSIS — J90 Pleural effusion, not elsewhere classified: Secondary | ICD-10-CM | POA: Diagnosis present

## 2015-11-23 DIAGNOSIS — M545 Low back pain, unspecified: Secondary | ICD-10-CM

## 2015-11-23 DIAGNOSIS — G8929 Other chronic pain: Secondary | ICD-10-CM | POA: Diagnosis present

## 2015-11-23 DIAGNOSIS — J869 Pyothorax without fistula: Secondary | ICD-10-CM | POA: Diagnosis present

## 2015-11-23 DIAGNOSIS — F17210 Nicotine dependence, cigarettes, uncomplicated: Secondary | ICD-10-CM | POA: Diagnosis present

## 2015-11-23 DIAGNOSIS — J939 Pneumothorax, unspecified: Secondary | ICD-10-CM

## 2015-11-23 DIAGNOSIS — B1910 Unspecified viral hepatitis B without hepatic coma: Secondary | ICD-10-CM | POA: Diagnosis present

## 2015-11-23 DIAGNOSIS — R0600 Dyspnea, unspecified: Secondary | ICD-10-CM | POA: Diagnosis not present

## 2015-11-23 DIAGNOSIS — F319 Bipolar disorder, unspecified: Secondary | ICD-10-CM | POA: Insufficient documentation

## 2015-11-23 DIAGNOSIS — G9340 Encephalopathy, unspecified: Secondary | ICD-10-CM | POA: Diagnosis present

## 2015-11-23 DIAGNOSIS — R0789 Other chest pain: Secondary | ICD-10-CM | POA: Insufficient documentation

## 2015-11-23 DIAGNOSIS — A419 Sepsis, unspecified organism: Secondary | ICD-10-CM | POA: Diagnosis not present

## 2015-11-23 DIAGNOSIS — J9600 Acute respiratory failure, unspecified whether with hypoxia or hypercapnia: Secondary | ICD-10-CM | POA: Insufficient documentation

## 2015-11-23 DIAGNOSIS — Z419 Encounter for procedure for purposes other than remedying health state, unspecified: Secondary | ICD-10-CM

## 2015-11-23 DIAGNOSIS — Z6830 Body mass index (BMI) 30.0-30.9, adult: Secondary | ICD-10-CM

## 2015-11-23 DIAGNOSIS — E871 Hypo-osmolality and hyponatremia: Secondary | ICD-10-CM | POA: Diagnosis present

## 2015-11-23 DIAGNOSIS — R590 Localized enlarged lymph nodes: Secondary | ICD-10-CM | POA: Insufficient documentation

## 2015-11-23 DIAGNOSIS — D649 Anemia, unspecified: Secondary | ICD-10-CM | POA: Diagnosis present

## 2015-11-23 DIAGNOSIS — Z79891 Long term (current) use of opiate analgesic: Secondary | ICD-10-CM | POA: Diagnosis not present

## 2015-11-23 DIAGNOSIS — F411 Generalized anxiety disorder: Secondary | ICD-10-CM | POA: Insufficient documentation

## 2015-11-23 DIAGNOSIS — J9601 Acute respiratory failure with hypoxia: Secondary | ICD-10-CM | POA: Diagnosis not present

## 2015-11-23 DIAGNOSIS — E669 Obesity, unspecified: Secondary | ICD-10-CM | POA: Diagnosis present

## 2015-11-23 DIAGNOSIS — R599 Enlarged lymph nodes, unspecified: Secondary | ICD-10-CM | POA: Diagnosis not present

## 2015-11-23 DIAGNOSIS — F3160 Bipolar disorder, current episode mixed, unspecified: Secondary | ICD-10-CM | POA: Diagnosis present

## 2015-11-23 DIAGNOSIS — E039 Hypothyroidism, unspecified: Secondary | ICD-10-CM | POA: Diagnosis present

## 2015-11-23 DIAGNOSIS — R739 Hyperglycemia, unspecified: Secondary | ICD-10-CM | POA: Diagnosis present

## 2015-11-23 DIAGNOSIS — J80 Acute respiratory distress syndrome: Secondary | ICD-10-CM | POA: Diagnosis not present

## 2015-11-23 DIAGNOSIS — E876 Hypokalemia: Secondary | ICD-10-CM | POA: Diagnosis present

## 2015-11-23 DIAGNOSIS — Z8541 Personal history of malignant neoplasm of cervix uteri: Secondary | ICD-10-CM | POA: Diagnosis not present

## 2015-11-23 DIAGNOSIS — I509 Heart failure, unspecified: Secondary | ICD-10-CM | POA: Diagnosis not present

## 2015-11-23 DIAGNOSIS — Z978 Presence of other specified devices: Secondary | ICD-10-CM

## 2015-11-23 DIAGNOSIS — G894 Chronic pain syndrome: Secondary | ICD-10-CM | POA: Diagnosis not present

## 2015-11-23 DIAGNOSIS — Z09 Encounter for follow-up examination after completed treatment for conditions other than malignant neoplasm: Secondary | ICD-10-CM

## 2015-11-23 DIAGNOSIS — E872 Acidosis: Secondary | ICD-10-CM | POA: Diagnosis present

## 2015-11-23 DIAGNOSIS — Z9689 Presence of other specified functional implants: Secondary | ICD-10-CM

## 2015-11-23 DIAGNOSIS — J189 Pneumonia, unspecified organism: Secondary | ICD-10-CM | POA: Diagnosis present

## 2015-11-23 DIAGNOSIS — Z4659 Encounter for fitting and adjustment of other gastrointestinal appliance and device: Secondary | ICD-10-CM

## 2015-11-23 DIAGNOSIS — E038 Other specified hypothyroidism: Secondary | ICD-10-CM | POA: Insufficient documentation

## 2015-11-23 DIAGNOSIS — R1010 Upper abdominal pain, unspecified: Secondary | ICD-10-CM | POA: Diagnosis not present

## 2015-11-23 DIAGNOSIS — Z789 Other specified health status: Secondary | ICD-10-CM | POA: Diagnosis not present

## 2015-11-23 DIAGNOSIS — R109 Unspecified abdominal pain: Secondary | ICD-10-CM | POA: Insufficient documentation

## 2015-11-23 DIAGNOSIS — J9620 Acute and chronic respiratory failure, unspecified whether with hypoxia or hypercapnia: Secondary | ICD-10-CM | POA: Insufficient documentation

## 2015-11-23 LAB — URINALYSIS, ROUTINE W REFLEX MICROSCOPIC
Glucose, UA: NEGATIVE mg/dL
KETONES UR: 15 mg/dL — AB
Leukocytes, UA: NEGATIVE
NITRITE: NEGATIVE
PROTEIN: 30 mg/dL — AB
Specific Gravity, Urine: 1.021 (ref 1.005–1.030)
pH: 6 (ref 5.0–8.0)

## 2015-11-23 LAB — URINE MICROSCOPIC-ADD ON

## 2015-11-23 LAB — CBC WITH DIFFERENTIAL/PLATELET
BASOS ABS: 0 10*3/uL (ref 0.0–0.1)
BASOS PCT: 0 %
Eosinophils Absolute: 0.1 10*3/uL (ref 0.0–0.7)
Eosinophils Relative: 0 %
HEMATOCRIT: 38.1 % (ref 36.0–46.0)
HEMOGLOBIN: 12.6 g/dL (ref 12.0–15.0)
LYMPHS PCT: 8 %
Lymphs Abs: 1.3 10*3/uL (ref 0.7–4.0)
MCH: 29.4 pg (ref 26.0–34.0)
MCHC: 33.1 g/dL (ref 30.0–36.0)
MCV: 88.8 fL (ref 78.0–100.0)
MONO ABS: 0.7 10*3/uL (ref 0.1–1.0)
Monocytes Relative: 4 %
NEUTROS ABS: 15.2 10*3/uL — AB (ref 1.7–7.7)
NEUTROS PCT: 88 %
Platelets: 402 10*3/uL — ABNORMAL HIGH (ref 150–400)
RBC: 4.29 MIL/uL (ref 3.87–5.11)
RDW: 13.5 % (ref 11.5–15.5)
WBC: 17.3 10*3/uL — ABNORMAL HIGH (ref 4.0–10.5)

## 2015-11-23 LAB — COMPREHENSIVE METABOLIC PANEL
ALBUMIN: 3.3 g/dL — AB (ref 3.5–5.0)
ALK PHOS: 94 U/L (ref 38–126)
ALT: 8 U/L — AB (ref 14–54)
AST: 17 U/L (ref 15–41)
Anion gap: 10 (ref 5–15)
BILIRUBIN TOTAL: 0.4 mg/dL (ref 0.3–1.2)
BUN: 6 mg/dL (ref 6–20)
CO2: 26 mmol/L (ref 22–32)
CREATININE: 0.79 mg/dL (ref 0.44–1.00)
Calcium: 8.7 mg/dL — ABNORMAL LOW (ref 8.9–10.3)
Chloride: 99 mmol/L — ABNORMAL LOW (ref 101–111)
GFR calc Af Amer: >60 mL/min (ref >60)
GLUCOSE: 165 mg/dL — AB (ref 65–99)
Potassium: 4.1 mmol/L (ref 3.5–5.1)
Sodium: 135 mmol/L (ref 135–145)
TOTAL PROTEIN: 7.6 g/dL (ref 6.5–8.1)

## 2015-11-23 LAB — LIPASE, BLOOD

## 2015-11-23 MED ORDER — ONDANSETRON HCL 4 MG/2ML IJ SOLN
4.0000 mg | Freq: Once | INTRAMUSCULAR | Status: AC
  Administered 2015-11-23: 4 mg via INTRAVENOUS
  Filled 2015-11-23: qty 2

## 2015-11-23 MED ORDER — DEXTROSE 5 % IV SOLN
1.0000 g | Freq: Once | INTRAVENOUS | Status: AC
  Administered 2015-11-23: 1 g via INTRAVENOUS
  Filled 2015-11-23: qty 10

## 2015-11-23 MED ORDER — MORPHINE SULFATE (PF) 4 MG/ML IV SOLN
4.0000 mg | Freq: Once | INTRAVENOUS | Status: AC
  Administered 2015-11-23: 4 mg via INTRAVENOUS
  Filled 2015-11-23: qty 1

## 2015-11-23 MED ORDER — SODIUM CHLORIDE 0.9 % IV BOLUS (SEPSIS)
1000.0000 mL | Freq: Once | INTRAVENOUS | Status: AC
  Administered 2015-11-23: 1000 mL via INTRAVENOUS

## 2015-11-23 MED ORDER — DEXTROSE 5 % IV SOLN: 500.0000 mg | Freq: Once | INTRAVENOUS | Status: DC

## 2015-11-23 MED ORDER — IOPAMIDOL (ISOVUE-370) INJECTION 76%
100.0000 mL | Freq: Once | INTRAVENOUS | Status: AC | PRN
  Administered 2015-11-23: 100 mL via INTRAVENOUS

## 2015-11-23 MED ORDER — HYDROMORPHONE HCL 1 MG/ML IJ SOLN
1.0000 mg | Freq: Once | INTRAMUSCULAR | Status: AC
  Administered 2015-11-23: 1 mg via INTRAVENOUS
  Filled 2015-11-23: qty 1

## 2015-11-23 NOTE — ED Notes (Signed)
CT tech states contrast dye allergy listed for pt. Discussed with pt, states she has never reacted to contrast dye, that her allergy is to betadine.

## 2015-11-23 NOTE — ED Notes (Signed)
Per pt report ongoing rt side abdominal pain, increased this week. Reports no fever/chill,naused , drinking soda currently .

## 2015-11-23 NOTE — ED Notes (Signed)
Pt crying, rocking in bed, asking for water, complaining of long wait time and wants to know when she can go home. Informed of abnormality on [place] and that the provider will need to determine whether further testing is necessary before she consumes anything. Updated Lawyer, PA and order for pain medicine received.

## 2015-11-23 NOTE — ED Notes (Signed)
Pt is continuously removing pulse ox and O2. Has been instructed to keep these on several times due to low sat on RA. States she does not want to stay here and wants to get abx and go home.

## 2015-11-23 NOTE — ED Provider Notes (Signed)
CSN: [id number]     Arrival date & time 11/23/15  1312 History   First MD Initiated Contact with Patient 11/23/15 1724     Chief Complaint  Patient presents with  . Abdominal Pain     (Consider location/radiation/quality/duration/timing/severity/associated sxs/prior Treatment) HPI Patient presents to the emergency department with upper abdominal pain and lower chest pain.  The patient states that her upper abdomen has been bothering her for about a month, but got worse over the last week.  The patient states that movement and palpation make the pain worse.  Patient states she did not take any medications prior to her for her symptomsThe patient denies, shortness of breath, headache,blurred vision, neck pain, fever, cough, weakness, numbness, dizziness, anorexia, edema,vomiting, diarrhea, rash, back pain, dysuria, hematemesis, bloody stool, near syncope, or syncope.  The patient states that she has had some nausea but no vomiting Past Medical History  Diagnosis Date  . Hepatitis B infection   . Cervical cancer ([place])     Stage 2  . Degenerative disk disease   . Ovarian cyst   . Chronic UTI   . Anxiety   . Bipolar 1 disorder ([place])   . Panic attack    Past Surgical History  Procedure Laterality Date  . Tubal ligation    . Mandible fracture surgery    . Wisdom tooth extraction    . Cervix surgery     History reviewed. No pertinent family history. Social History  Substance Use Topics  . Smoking status: Current Every Day Smoker -- 1.00 packs/day for 25 years    Types: Cigarettes  . Smokeless tobacco: Never Used  . Alcohol Use: No   OB History    No data available     Review of Systems All other systems negative except as documented in the HPI. All pertinent positives and negatives as reviewed in the HPI.   Allergies  Citalopram; Hydrocodone; Ibuprofen; Naproxen; and Povidone-iodine  Home Medications   Prior to Admission medications   Medication Sig Start Date End Date  Taking? Authorizing Provider  baclofen (LIORESAL) 10 MG tablet Take 1 tablet (10 mg total) by mouth 3 (three) times daily. 10/02/15  Yes Zenia Resides, MD  gabapentin (NEURONTIN) 300 MG capsule Take 1 capsule (300 mg total) by mouth 3 (three) times daily. 11/18/15  Yes Zenia Resides, MD  hydrOXYzine (VISTARIL) 25 MG capsule Take 1 capsule (25 mg total) by mouth 3 (three) times daily as needed for anxiety. 10/02/15  Yes Zenia Resides, MD  levothyroxine (SYNTHROID, LEVOTHROID) 50 MCG tablet Take 1 tablet (50 mcg total) by mouth daily. 11/18/15  Yes Zenia Resides, MD  lithium 300 MG tablet Take 1 tablet (300 mg total) by mouth 2 (two) times daily. 10/23/15  Yes Zenia Resides, MD  methadone (DOLOPHINE) 10 MG tablet Take 10 mg by mouth daily. Dose per methadone clinic, current dose uncertain   Yes Historical Provider, MD  QUEtiapine (SEROQUEL XR) 300 MG 24 hr tablet Take 1 tablet (300 mg total) by mouth at bedtime. 11/18/15  Yes Zenia Resides, MD   BP 113/67 mmHg  Pulse 94  Temp(Src) 99.4 F (37.4 C) (Oral)  Resp 18  Ht 5\' 9"  (1.753 m)  Wt 89.812 kg  BMI 29.23 kg/m2  SpO2 90%  LMP 11/23/2015 Physical Exam  Constitutional: She is oriented to person, place, and time. She appears well-developed and well-nourished. No distress.  HENT:  Head: Normocephalic and atraumatic.  Mouth/Throat: Oropharynx is clear  and moist.  Eyes: Pupils are equal, round, and reactive to light.  Neck: Normal range of motion. Neck supple.  Cardiovascular: Normal rate, regular rhythm and normal heart sounds.  Exam reveals no gallop and no friction rub.   No murmur heard. Pulmonary/Chest: Effort normal and breath sounds normal. No respiratory distress. She has no wheezes.  Abdominal: Soft. Normal appearance and bowel sounds are normal. She exhibits no distension. There is tenderness in the right upper quadrant. There is no rebound and no guarding.    Neurological: She is alert and oriented to person, place, and  time. She exhibits normal muscle tone. Coordination normal.  Skin: Skin is warm and dry. No rash noted. No erythema.  Psychiatric: She has a normal mood and affect. Her behavior is normal.  Nursing note and vitals reviewed.   ED Course  Procedures (including critical care time) Labs Review Labs Reviewed  URINALYSIS, ROUTINE W REFLEX MICROSCOPIC (NOT AT [facility]) - Abnormal; Notable for the following:    Color, Urine AMBER (*)    APPearance CLOUDY (*)    Hgb urine dipstick SMALL (*)    Bilirubin Urine SMALL (*)    Ketones, ur 15 (*)    Protein, ur 30 (*)    All other components within normal limits  URINE MICROSCOPIC-ADD ON - Abnormal; Notable for the following:    Squamous Epithelial / LPF 0-5 (*)    Bacteria, UA MANY (*)    Casts HYALINE CASTS (*)    All other components within normal limits  CBC WITH DIFFERENTIAL/PLATELET - Abnormal; Notable for the following:    WBC 17.3 (*)    Platelets 402 (*)    Neutro Abs 15.2 (*)    All other components within normal limits  COMPREHENSIVE METABOLIC PANEL - Abnormal; Notable for the following:    Chloride 99 (*)    Glucose, Bld 165 (*)    Calcium 8.7 (*)    Albumin 3.3 (*)    ALT 8 (*)    All other components within normal limits  LIPASE, BLOOD - Abnormal; Notable for the following:    Lipase <10 (*)    All other components within normal limits    Imaging Review Ct Angio Chest Pe W/cm &/or Wo Cm  11/23/2015  CLINICAL DATA:  RIGHT-sided abdominal pain. Chest pain. Pleural effusion by ultrasound. EXAM: CT ANGIOGRAPHY CHEST WITH CONTRAST TECHNIQUE: Multidetector CT imaging of the chest was performed using the standard protocol during bolus administration of intravenous contrast. Multiplanar CT image reconstructions and MIPs were obtained to evaluate the vascular anatomy. CONTRAST:  100 mL Isovue 370 COMPARISON:  Ultrasound 11/23/2015 FINDINGS: Mediastinum/Nodes: No axillary supraclavicular adenopathy. Moderate mediastinal lymphadenopathy.  Subcarinal ill-defined adenopathy measures 1.9 cm. Precarinal lymph node measures 1.2 cm. There is no central pulmonary embolism.  No pericardial fluid. Lungs/Pleura: This consolidated lung in the RIGHT lower lobe measuring 7.7 by 3.9 cm. There is RIGHT hilar fullness with peribronchial and perihilar thickening measuring 3.3 cm (image 51, series 9). There is partially loculated fluid in the RIGHT lower lobe along the oblique fissure. There is mild interlobular septal thickening pneumonia lungs. There is patchy ground-glass opacities in the upper lobes. Upper abdomen: Limited view of the liver, kidneys, pancreas are unremarkable. Normal adrenal glands. Musculoskeletal: No aggressive osseous Review of the MIP images confirms the above findings. IMPRESSION: 1. Consolidated lung in the RIGHT lower lobe with differential including pneumonia versus neoplasm. 2. Peribronchial and perihilar thickening on the RIGHT consistent with infection or neoplasm. 3.  Mild mediastinal adenopathy differential including metastatic adenopathy versus reactive adenopathy 4. Partially loculated pleural fluid in the lower RIGHT thorax. 5. Overall favor findings to be infectious in etiology but certainly cannot not exclude neoplasm. Consider bronchoscopy for evaluation of the RIGHT lower lobe masslike consolidation if not responsive to antibiotic therapy. Electronically Signed   By: Suzy Bouchard M.D.   On: 11/23/2015 21:39   US Abdomen Complete  11/23/2015  CLINICAL DATA:  Pt c/o ruq pain x 4-6 weeks, worse today, please note, had to scan pt sitting in an inclined position because she could not breathe well and is on oxygen EXAM: ABDOMEN ULTRASOUND COMPLETE COMPARISON:  None. FINDINGS: Gallbladder: No gallstones or wall thickening visualized. No sonographic Murphy sign noted by sonographer. Common bile duct: Diameter: Common bile duct is mildly dilated 8 mm. More distally common bile duct measures 5 mm which is within normal limits.  Liver: No focal lesion identified. Within normal limits in parenchymal echogenicity. IVC: No abnormality visualized. Pancreas: Visualized portion unremarkable. Spleen: Size and appearance within normal limits. Right Kidney: Length: 11.8 cm. Echogenicity within normal limits. No mass or hydronephrosis visualized. Left Kidney: Length: 11.4 cm. Echogenicity within normal limits. No mass or hydronephrosis visualized. Abdominal aorta: No aneurysm visualized. Other findings: Small RIGHT effusion. IMPRESSION: 1. No evidence of cholecystitis. 2. Mild dilatation of the common bile duct is likely a benign variant. Consider correlation with bilirubin level. 3. RIGHT pleural effusion of unclear etiology. Electronically Signed   By: Suzy Bouchard M.D.   On: 11/23/2015 19:41   I have personally reviewed and evaluated these images and lab results as part of my medical decision-making.   EKG Interpretation None      The patient initially had a presentation was consistent with right upper quadrant pain.  Did consider that this could be a lower chest issue as well.  We did start with the ultrasound of the upper quadrant, but did not show anything other then a pleural effusion.  The patient then had a CT of her chest to rule out significant pathology.  The patient was a do have a fairly large pneumonia with effusion.  The patient does have an oxygen requirement.  I spoke with the admitting doctors about the patient.  IV antibiotics started.  Patient vital signs other than her pulse oximetry remained stable   Dalia Heading, PA-C 11/24/15 0147  Ezequiel Essex, MD 11/24/15 [phone]

## 2015-11-24 ENCOUNTER — Other Ambulatory Visit: Payer: Self-pay

## 2015-11-24 DIAGNOSIS — J90 Pleural effusion, not elsewhere classified: Secondary | ICD-10-CM

## 2015-11-24 DIAGNOSIS — R0600 Dyspnea, unspecified: Secondary | ICD-10-CM

## 2015-11-24 DIAGNOSIS — J189 Pneumonia, unspecified organism: Secondary | ICD-10-CM

## 2015-11-24 DIAGNOSIS — R0602 Shortness of breath: Secondary | ICD-10-CM

## 2015-11-24 LAB — BASIC METABOLIC PANEL
ANION GAP: 9 (ref 5–15)
CHLORIDE: 97 mmol/L — AB (ref 101–111)
CO2: 26 mmol/L (ref 22–32)
Calcium: 8.2 mg/dL — ABNORMAL LOW (ref 8.9–10.3)
Creatinine, Ser: 0.65 mg/dL (ref 0.44–1.00)
Glucose, Bld: 144 mg/dL — ABNORMAL HIGH (ref 65–99)
POTASSIUM: 3.8 mmol/L (ref 3.5–5.1)
SODIUM: 132 mmol/L — AB (ref 135–145)

## 2015-11-24 LAB — CBC
HEMATOCRIT: 36 % (ref 36.0–46.0)
HEMOGLOBIN: 11.4 g/dL — AB (ref 12.0–15.0)
MCH: 27.9 pg (ref 26.0–34.0)
MCHC: 31.7 g/dL (ref 30.0–36.0)
MCV: 88.2 fL (ref 78.0–100.0)
PLATELETS: 385 10*3/uL (ref 150–400)
RBC: 4.08 MIL/uL (ref 3.87–5.11)
RDW: 13.7 % (ref 11.5–15.5)
WBC: 16.6 10*3/uL — AB (ref 4.0–10.5)

## 2015-11-24 LAB — TROPONIN I

## 2015-11-24 MED ORDER — ENOXAPARIN SODIUM 40 MG/0.4ML ~~LOC~~ SOLN
40.0000 mg | SUBCUTANEOUS | Status: DC
  Administered 2015-11-24 – 2015-11-25 (×2): 40 mg via SUBCUTANEOUS
  Filled 2015-11-24 (×3): qty 0.4

## 2015-11-24 MED ORDER — DEXTROSE 5 % IV SOLN
500.0000 mg | INTRAVENOUS | Status: DC
  Administered 2015-11-25: 500 mg via INTRAVENOUS
  Filled 2015-11-24 (×2): qty 500

## 2015-11-24 MED ORDER — LEVOTHYROXINE SODIUM 50 MCG PO TABS
50.0000 ug | ORAL_TABLET | Freq: Every day | ORAL | Status: DC
  Administered 2015-11-24 – 2015-11-25 (×2): 50 ug via ORAL
  Filled 2015-11-24 (×3): qty 1

## 2015-11-24 MED ORDER — DEXTROSE 5 % IV SOLN
500.0000 mg | Freq: Once | INTRAVENOUS | Status: AC
  Administered 2015-11-24: 500 mg via INTRAVENOUS
  Filled 2015-11-24: qty 500

## 2015-11-24 MED ORDER — LITHIUM CARBONATE 300 MG PO CAPS
300.0000 mg | ORAL_CAPSULE | Freq: Two times a day (BID) | ORAL | Status: DC
  Administered 2015-11-24 – 2015-11-25 (×4): 300 mg via ORAL
  Filled 2015-11-24 (×5): qty 1

## 2015-11-24 MED ORDER — ACETAMINOPHEN 650 MG RE SUPP
650.0000 mg | Freq: Four times a day (QID) | RECTAL | Status: DC | PRN
Start: 2015-11-24 — End: 2015-11-25

## 2015-11-24 MED ORDER — HYDROXYZINE PAMOATE 25 MG PO CAPS: 25.0000 mg | ORAL_CAPSULE | Freq: Three times a day (TID) | ORAL | Status: DC | PRN

## 2015-11-24 MED ORDER — HYDROMORPHONE HCL 1 MG/ML IJ SOLN
1.0000 mg | Freq: Once | INTRAMUSCULAR | Status: AC
  Administered 2015-11-24: 1 mg via INTRAVENOUS
  Filled 2015-11-24: qty 1

## 2015-11-24 MED ORDER — DEXTROSE 5 % IV SOLN
1.0000 g | INTRAVENOUS | Status: DC
  Administered 2015-11-24 – 2015-11-25 (×2): 1 g via INTRAVENOUS
  Filled 2015-11-24 (×4): qty 10

## 2015-11-24 MED ORDER — KETOROLAC TROMETHAMINE 15 MG/ML IJ SOLN
15.0000 mg | Freq: Four times a day (QID) | INTRAMUSCULAR | Status: DC
  Administered 2015-11-24 – 2015-11-25 (×2): 15 mg via INTRAVENOUS
  Filled 2015-11-24 (×7): qty 1

## 2015-11-24 MED ORDER — ALBUTEROL SULFATE (2.5 MG/3ML) 0.083% IN NEBU
2.5000 mg | INHALATION_SOLUTION | RESPIRATORY_TRACT | Status: DC | PRN
Start: 2015-11-24 — End: 2015-11-25
  Administered 2015-11-24: 2.5 mg via RESPIRATORY_TRACT
  Filled 2015-11-24: qty 3

## 2015-11-24 MED ORDER — BACLOFEN 10 MG PO TABS
10.0000 mg | ORAL_TABLET | Freq: Three times a day (TID) | ORAL | Status: DC
  Administered 2015-11-24 – 2015-11-25 (×5): 10 mg via ORAL
  Filled 2015-11-24 (×8): qty 1

## 2015-11-24 MED ORDER — SODIUM CHLORIDE 0.9 % IV SOLN
INTRAVENOUS | Status: DC
  Administered 2015-11-24 (×2): via INTRAVENOUS

## 2015-11-24 MED ORDER — ACETAMINOPHEN 325 MG PO TABS: 650.0000 mg | ORAL_TABLET | Freq: Four times a day (QID) | ORAL | Status: DC | PRN

## 2015-11-24 MED ORDER — SODIUM CHLORIDE 0.9% FLUSH
3.0000 mL | Freq: Two times a day (BID) | INTRAVENOUS | Status: DC
  Administered 2015-11-24 – 2015-11-25 (×3): 3 mL via INTRAVENOUS

## 2015-11-24 MED ORDER — NICOTINE 21 MG/24HR TD PT24
21.0000 mg | MEDICATED_PATCH | Freq: Every day | TRANSDERMAL | Status: DC
  Administered 2015-11-24 – 2015-11-25 (×2): 21 mg via TRANSDERMAL
  Filled 2015-11-24 (×3): qty 1

## 2015-11-24 MED ORDER — BENZONATATE 100 MG PO CAPS
200.0000 mg | ORAL_CAPSULE | Freq: Three times a day (TID) | ORAL | Status: DC | PRN
Start: 2015-11-24 — End: 2015-11-26
  Filled 2015-11-24 (×2): qty 2

## 2015-11-24 MED ORDER — QUETIAPINE FUMARATE ER 300 MG PO TB24
300.0000 mg | ORAL_TABLET | Freq: Every day | ORAL | Status: DC
  Administered 2015-11-24 – 2015-11-25 (×2): 300 mg via ORAL
  Filled 2015-11-24 (×2): qty 1

## 2015-11-24 MED ORDER — ACETAMINOPHEN 325 MG PO TABS
650.0000 mg | ORAL_TABLET | Freq: Three times a day (TID) | ORAL | Status: DC
  Administered 2015-11-24 – 2015-11-25 (×2): 650 mg via ORAL
  Filled 2015-11-24 (×2): qty 2

## 2015-11-24 MED ORDER — SODIUM CHLORIDE 0.9 % IV BOLUS (SEPSIS)
500.0000 mL | Freq: Once | INTRAVENOUS | Status: AC
  Administered 2015-11-24: 500 mL via INTRAVENOUS

## 2015-11-24 MED ORDER — HYDROXYZINE HCL 25 MG PO TABS
25.0000 mg | ORAL_TABLET | Freq: Three times a day (TID) | ORAL | Status: DC | PRN
  Administered 2015-11-24 – 2015-11-25 (×2): 25 mg via ORAL
  Filled 2015-11-24 (×4): qty 1

## 2015-11-24 MED ORDER — GABAPENTIN 300 MG PO CAPS
300.0000 mg | ORAL_CAPSULE | Freq: Three times a day (TID) | ORAL | Status: DC
  Administered 2015-11-24 – 2015-11-25 (×6): 300 mg via ORAL
  Filled 2015-11-24 (×8): qty 1

## 2015-11-24 MED ORDER — METHADONE HCL 10 MG PO TABS
120.0000 mg | ORAL_TABLET | Freq: Every day | ORAL | Status: DC
  Administered 2015-11-24 – 2015-11-25 (×2): 120 mg via ORAL
  Filled 2015-11-24 (×2): qty 12

## 2015-11-24 MED ORDER — SENNA 8.6 MG PO TABS
1.0000 | ORAL_TABLET | Freq: Every day | ORAL | Status: DC | PRN
  Administered 2015-11-24: 8.6 mg via ORAL
  Filled 2015-11-24 (×2): qty 1

## 2015-11-24 NOTE — Progress Notes (Signed)
Patient arrived on unit via [facility]. Patient alert and oriented x4. Patient oriented to unit, staff and room. Patient placed on telemetry, [place] notified. Patient's IV clean, dry and intact. Patient rates pain 10/10, MD aware. Safety Fall Prevention Plan was discussed with patient. Orders have been reviewed and implemented. Will continue to monitor the patient. Call light has been placed within reach.  Nena Polio BSN, RN  Phone Number: [phone]

## 2015-11-24 NOTE — Progress Notes (Signed)
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 IMTS notified and new orders received for stool softeners. Also informed them that it appears as if IV zithromax was documented as "not given" from [facility] and she had not received that dose. New orders to be received.  Joellen Jersey, RN.

## 2015-11-24 NOTE — Progress Notes (Signed)
Patient has been c/o uncontrolled pain and SOB since the start of the shift. Lung sounds have been diminished and patient has had adequate O2 sats on 2L n/c. Patient received her AM meds this morning along with prn atarax. States she did not receive any relief. MD was notified with no new orders. At 1336, pt's BP 82/66. Pt was diaphoretic at this time, very anxious, and c/o worsening SOB. MD notified and new orders received for 500cc bolus. After bolus at 1539, pt's BP was improved but pt's O2 sats were now in the mid 80's on 2L O2 n/c, but did increase to 92% after taking some deep breaths. MD notified again and new orders received for prn neb treatments. Patient remains very anxious and jittery. She continues to state she feels worse than she did on admission and doesn't understand why the MD will not give her any additional pain meds. Patient is refusing scheduled gabapentin and baclofen at this time, along with prn atarax. Rapid response RN notified. Pt received prn neb treatment at 1610. MD at bedside while receiving neb treatment. Patient denies feeling any relief. Awaiting possible new orders from MD.   Joellen Jersey, RN.

## 2015-11-24 NOTE — Progress Notes (Signed)
Called by Rn for patient diaphoretic, anxious and c/o trouble breathing.  Upon my arrival to patients room, Rn at bedside.  Patient appears very anxious, c/o not being able to breath and in lots of pain on the right side of her chest.  States she is worse than yesterday.  She is refusing all prn  meds for pain and anxiety stating they do not work.  Patient requesting morphine and dilaudid.  Rn administering breathing treatment.  HR 113.  Md has been called and will come see patient.

## 2015-11-24 NOTE — Progress Notes (Signed)
FPTS Interim Progress Note  S: Went to evaluate patient. RN stated patient looks bad; diaphoretic and wheezing.   Patient states that she is in pain and it is causing her to be miserable. Also starting to have some SOB. Is requesting pain medicine such as dilaudid or morphine.   Asked patient if she could be withdrawing from anything and she denies.   O: BP 124/79 mmHg  Pulse 102  Temp(Src) 99.7 F (37.6 C) (Oral)  Resp 24  Ht 5\' 9"  (1.753 m)  Wt 210 lb 4.8 oz (95.391 kg)  BMI 31.04 kg/m2  SpO2 92%  LMP 11/23/2015   Gen: Anxious appearing, diaphoretic, lying in bed, distressed Lungs: poor air movement on right, no lung sounds osculated on left base, Vincent in place Heart: RRR   A/P: Kylie [name] is a 45 y.o. female presenting with CAP and hypoxia. Clinically patient stable vitals wise. No more reported fevers. She looks worse than what her vitals are indicating.   Pain - Patient states she is in severe pain. Patient looks uncomfortable. Her CAP is giving her pleuritic pain.  It was charted patient was refusing prn medications. Will place order for scheduled Toradol and tylenol. Patient receives methadone 120mg  daily as well. Will try and avoid narcotic pain medicines in this high risk patient.   Anxiety- Patient has known history of anxiety and panic disorders. States she weaned herself of Xanax which she was taking for 25 years. She has order for prn atarax which she has received only once at 8am this morning. Would like for the nurse to give another dose before stating this is treatment failure and switching to an alternative. She appears to be a very anxious individual and feel this is adding to her clinical picture.   Dyspnea - Poor air movement in right lung base. Order placed for prn breathing treatment. O2 saturation 92% on 2L. Continue to monitor.   Katheren Shams, DO 11/24/2015, 3:57 PM PGY-2, [facility] Service pager [phone]

## 2015-11-24 NOTE — H&P (Signed)
[facility] Admission History and Physical Service Pager: [phone]  Patient name: Kylie [name] Medical record number: [id number] Date of birth: [birth year] Age: 45 y.o. Gender: female  Primary Care Provider: Zigmund Gottron, MD Consultants: None Code Status: Full (confirmed on admission)  Chief Complaint: dyspnea and right-sided chest and abdominal pain  Assessment and Plan: Kylie [name] is a 45 y.o. female presenting with SOB, cough, and right-sided lower chest and upper abdominal pain. PMH is significant for bipolar disorder, chronic pain, drug abuse, tobacco abuse, and hepatitis B.   CAP: CTA Chest showed large consolidation of RLL with peribronchial and perihilar thickening and paritally loculated fluid. Moderate mediastinal lymphadenopathy concerning for reactive adenopathy in setting of infection vs. metastatic lyphadenopathy. New oxygen requirement with desat to 84% on RA at OSH. Currently on 2L by [place]. Patient afebrile, initially tachycardic and tachypneic. qSOFA score of 1. WBC 17.3. No recent hospitalizations or antibiotic use. Would pursue cancer w/u if symptoms do not improve with antibiotic treatment. (Of note, pt's mother had breast cancer.) - Admit to telemetry, Attending Dr. Erin Hearing - s/p ceftriaxone in ED; azithromycin ordered - Plan to transition to PO amoxicillin + doxy or azithro evening of 6/25 if remains clinically stable - Supplemental O2 as needed to maintain O2 sats >/= 92% - Wean O2 as tolerated - Ambulatory pulse ox prior to discharge - Repeat CXR in 6 weeks - Radiologist recommended consideration of bronchoscopy for evaluation RLL masslike consolidation if not responsive to abx therapy - Pulse oximetry with vital checks - Blood and urine cultures not obtained at OSH prior to initiating abx  Chest pain: Atypical pain. Most likely chest wall pain in setting of pneumonia; worsened with cough. Also anxiety may be  contributory. Well's Score of 1.5. No central PE noted on CTA chest.   - Obtain EKG and troponins x 2, as not obtained at OSH.    Abdominal pain: Resolved after transfer. Abdominal U/S showed no evidence of cholecystitis but with mild dilation of common bile duct and right pleural effusion. Lipase < 10. UA negative for nitrites and leukocytes but with small hemoglobin, ketones, and protein. Tbili WNL. S/p IV morphine 4 mg and dilaudid 1 mg x 2 at OSH.  - Continue to monitor  Bipolar I disorder:  - Continue home lithium 300 mg BID, hydroxyzine 25 mg TID, quetiapine 300 mg QHS  Hypothyroidism: TSH 7.61 on 10/02/15 - Continue home levothyroxine 50 mcg daily - Repeat TSH in 1 month  Hx of abnormal Pap Smear: Had LEEP 07/2010 after high grade lesion seen on colposcopy, performed after abnormal pap smear. She has had negative pap smears since. 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  - Repeat pap smear in 1 year  Chronic pain: Patient reports she takes 120 mg methadone daily from the clinic on [facility]. 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  - Continue home baclofen 10 mg TID, gabapentin 300 mg TID - Continue methadone 120 mg daily  Tobacco abuse: Smokes 1 ppd. - Nicotine patch 21 mg daily, per patient preference  Hepatitis B: Surface Antigen still positive 01/26/2008 with negative Hep B S Ab. No transamininitis on admission with AST 17 and ALT 8.  - Order Heb B S Ab and Ag   Hyperglycemia: Glucose 165 on admission in setting of infection. Hgb A1c 5.3 on 10/02/15.   FEN/GI: Regular diet Prophylaxis: lovenox  Disposition: Admit  and wean off of O2 and transition to PO abx.   History of Present Illness:  Kylie [name] is a 45 y.o. female presenting with shortness of breath. SOB has been present for the last month, coming and going. Over the last two weeks and especially the last couple of days, dyspnea has  gotten worse, and patient has had dry cough. She has had associated right UQ pain and right lower chest pain, worsened by cough. Cough has also caused loss of bladder control. She reports having subjective fever "a couple of times" over the last month. She denies sick contacts. She has not been hospitalized or had antibiotics within the last 3 months. She has not tried any medicines for her symptoms. Some nausea but no v/d.   Initial workup at OSH focused on presenting complaint of RUQ pain. CTA chest performed due to pleural effusion incidentally seen on abdominal U/S.  Review Of Systems: Per HPI with the following additions: No dysuria. Chronic constipation on methadone.  Otherwise the remainder of the systems were negative.  Patient Active Problem List   Diagnosis Date Noted  . CAP (community acquired pneumonia) 11/23/2015  . History of abnormal cervical Pap smear 10/03/2015  . Urinary, incontinence, stress female 10/02/2015  . Major depressive disorder, recurrent episode, moderate ([place])   . Major depression, recurrent ([place]) 04/06/2014  . MDD (major depressive disorder), recurrent severe, without psychosis ([place]) 04/05/2014  . Suicide attempt ([place])   . Hip pain, left 08/14/2011  . PAP SMER W/ATYPCL SQAUMOUS CELL NOT EXCLD HI GRD 04/09/2010  . HEPATITIS B 01/31/2008  . Drug abuse in remission 04/04/2007  . OBESITY, NOS 07/29/2006  . ANXIETY 07/29/2006  . TOBACCO DEPENDENCE 07/29/2006  . Bipolar I disorder, most recent episode mixed ([place]) 07/29/2006  . Chronic bilateral low back pain without sciatica 07/29/2006    Past Medical History: Past Medical History  Diagnosis Date  . Hepatitis B infection   . Cervical cancer ([place])     Stage 2  . Degenerative disk disease   . Ovarian cyst   . Chronic UTI   . Anxiety   . Bipolar 1 disorder ([place])   . Panic attack     Past Surgical History: Past Surgical History  Procedure Laterality Date  . Tubal ligation    . Mandible fracture  surgery    . Wisdom tooth extraction    . Cervix surgery      Social History: Social History  Substance Use Topics  . Smoking status: Current Every Day Smoker -- 1.00 packs/day for 25 years    Types: Cigarettes  . Smokeless tobacco: Never Used  . Alcohol Use: No   Additional social history: Lives with fiance.  Please also refer to relevant sections of EMR.  Family History: History reviewed. No pertinent family history. Mother had HTN and breast cancer. Dad without known medical problems. No siblings.   Allergies and Medications: Allergies  Allergen Reactions  . Citalopram     Worsened anxiety  . Hydrocodone Itching  . Ibuprofen     Stomach pain  . Naproxen Swelling    troat and fingers swelling   . Povidone-Iodine Itching and Rash   No current facility-administered medications on file prior to encounter.   Current Outpatient Prescriptions on File Prior to Encounter  Medication Sig Dispense Refill  . baclofen (LIORESAL) 10 MG tablet Take 1 tablet (10 mg total) by mouth 3 (three) times daily. 90 each 3  . gabapentin (NEURONTIN) 300 MG capsule Take  1 capsule (300 mg total) by mouth 3 (three) times daily. 90 capsule 12  . hydrOXYzine (VISTARIL) 25 MG capsule Take 1 capsule (25 mg total) by mouth 3 (three) times daily as needed for anxiety. 90 capsule 3  . levothyroxine (SYNTHROID, LEVOTHROID) 50 MCG tablet Take 1 tablet (50 mcg total) by mouth daily. 90 tablet 3  . lithium 300 MG tablet Take 1 tablet (300 mg total) by mouth 2 (two) times daily. 60 tablet 5  . methadone (DOLOPHINE) 10 MG tablet Take 10 mg by mouth daily. Dose per methadone clinic, current dose uncertain    . QUEtiapine (SEROQUEL XR) 300 MG 24 hr tablet Take 1 tablet (300 mg total) by mouth at bedtime. 30 tablet 5    Objective: BP 122/68 mmHg  Pulse 88  Temp(Src) 99.4 F (37.4 C) (Oral)  Resp 20  Ht 5\' 9"  (1.753 m)  Wt 198 lb (89.812 kg)  BMI 29.23 kg/m2  SpO2 99%  LMP 11/23/2015 Exam: General:  Overweight female, sitting up in bed with nasal canula in place Eyes: PERRLA, EOMI ENTM: MMM, oropharynx normal Neck: Supple, no cervical lymphadenopathy  Cardiovascular: RRR, S1, S2, no m/r/g Respiratory: Crackles throughout mid and lower lung fields, moving good air. No increased WOB.  Abdomen: hypoactive bowel sounds, S, NT, ND, no rebound or guarding MSK: Moves all extremities spontaneously, no LE edema Skin: No rashes or lesions appreciated Neuro: AOx3, no focal deficits Psych: Appropriate mood and affect  Labs and Imaging: CBC BMET   Recent Labs Lab 11/23/15 1551  WBC 17.3*  HGB 12.6  HCT 38.1  PLT 402*    Recent Labs Lab 11/23/15 1551  NA 135  K 4.1  CL 99*  CO2 26  BUN 6  CREATININE 0.79  GLUCOSE 165*  CALCIUM 8.7*     Ct Angio Chest Pe W/cm &/or Wo Cm  11/23/2015  CLINICAL DATA:  RIGHT-sided abdominal pain. Chest pain. Pleural effusion by ultrasound. EXAM: CT ANGIOGRAPHY CHEST WITH CONTRAST TECHNIQUE: Multidetector CT imaging of the chest was performed using the standard protocol during bolus administration of intravenous contrast. Multiplanar CT image reconstructions and MIPs were obtained to evaluate the vascular anatomy. CONTRAST:  100 mL Isovue 370 COMPARISON:  Ultrasound 11/23/2015 FINDINGS: Mediastinum/Nodes: No axillary supraclavicular adenopathy. Moderate mediastinal lymphadenopathy. Subcarinal ill-defined adenopathy measures 1.9 cm. Precarinal lymph node measures 1.2 cm. There is no central pulmonary embolism.  No pericardial fluid. Lungs/Pleura: This consolidated lung in the RIGHT lower lobe measuring 7.7 by 3.9 cm. There is RIGHT hilar fullness with peribronchial and perihilar thickening measuring 3.3 cm (image 51, series 9). There is partially loculated fluid in the RIGHT lower lobe along the oblique fissure. There is mild interlobular septal thickening pneumonia lungs. There is patchy ground-glass opacities in the upper lobes. Upper abdomen: Limited view of  the liver, kidneys, pancreas are unremarkable. Normal adrenal glands. Musculoskeletal: No aggressive osseous Review of the MIP images confirms the above findings. IMPRESSION: 1. Consolidated lung in the RIGHT lower lobe with differential including pneumonia versus neoplasm. 2. Peribronchial and perihilar thickening on the RIGHT consistent with infection or neoplasm. 3. Mild mediastinal adenopathy differential including metastatic adenopathy versus reactive adenopathy 4. Partially loculated pleural fluid in the lower RIGHT thorax. 5. Overall favor findings to be infectious in etiology but certainly cannot not exclude neoplasm. Consider bronchoscopy for evaluation of the RIGHT lower lobe masslike consolidation if not responsive to antibiotic therapy. Electronically Signed   By: Suzy Bouchard M.D.   On: 11/23/2015 21:39  US Abdomen Complete  11/23/2015  CLINICAL DATA:  Pt c/o ruq pain x 4-6 weeks, worse today, please note, had to scan pt sitting in an inclined position because she could not breathe well and is on oxygen EXAM: ABDOMEN ULTRASOUND COMPLETE COMPARISON:  None. FINDINGS: Gallbladder: No gallstones or wall thickening visualized. No sonographic Murphy sign noted by sonographer. Common bile duct: Diameter: Common bile duct is mildly dilated 8 mm. More distally common bile duct measures 5 mm which is within normal limits. Liver: No focal lesion identified. Within normal limits in parenchymal echogenicity. IVC: No abnormality visualized. Pancreas: Visualized portion unremarkable. Spleen: Size and appearance within normal limits. Right Kidney: Length: 11.8 cm. Echogenicity within normal limits. No mass or hydronephrosis visualized. Left Kidney: Length: 11.4 cm. Echogenicity within normal limits. No mass or hydronephrosis visualized. Abdominal aorta: No aneurysm visualized. Other findings: Small RIGHT effusion. IMPRESSION: 1. No evidence of cholecystitis. 2. Mild dilatation of the common bile duct is likely  a benign variant. Consider correlation with bilirubin level. 3. RIGHT pleural effusion of unclear etiology. Electronically Signed   By: Suzy Bouchard M.D.   On: 11/23/2015 19:41   Hillary Corinda Gubler, MD 11/24/2015, 2:20 AM PGY-1, [place] [facility] pager: [phone], text pages welcome  Upper Level Addendum:  I have seen and evaluated this patient along with Dr. Ola Spurr and reviewed the above note, making necessary revisions in pink.  Virginia Crews, MD, MPH PGY-2,  [facility] 11/24/2015 7:35 AM

## 2015-11-24 NOTE — Progress Notes (Signed)
Pharmacy Antibiotic Note  Kylie [name] is a 45 y.o. female admitted on 11/23/2015 with pneumonia.   Pharmacy to dose CTX for CAP. Pt also receiving azithromycin. WBC 16.6, Tmax 99, no cultures  CTA Chest showed large consolidation of RLL with peribronchial and perihilar thickening and paritally loculated fluid. Moderate mediastinal lymphadenopathy concerning for reactive adenopathy in setting of infection vs. metastatic   Plan: Continue CTX 1g IV Q24h ours  Continue Azithromycin  F/U LOT  Plans to transition to PO amoxicillin + doxy or azithro 6/25 PM if patient remains stable   Height: 5\' 9"  (175.3 cm) Weight: 210 lb 4.8 oz (95.391 kg) IBW/kg (Calculated) : 66.2  Temp (24hrs), Avg:99.1 F (37.3 C), Min:98.9 F (37.2 C), Max:99.4 F (37.4 C)   Recent Labs Lab 11/23/15 1551 11/24/15 0439  WBC 17.3* 16.6*  CREATININE 0.79 0.65    Estimated Creatinine Clearance: 111.5 mL/min (by C-G formula based on Cr of 0.65).    Allergies  Allergen Reactions  . Pregabalin Anaphylaxis  . Hydrocodone Itching and Rash  . Citalopram     Worsened anxiety  . Ibuprofen     Stomach pain  . Naproxen Swelling    troat and fingers swelling   . Povidone-Iodine Itching and Rash    Antimicrobials this admission: 6/24 CTX>>  6/24 Azithromycin>>  Thank you for allowing pharmacy to be a part of this patient's care.  Alban Marucci C. Lennox Grumbles, PharmD Pharmacy Resident  Pager: [phone] 11/24/2015 12:00 PM

## 2015-11-24 NOTE — ED Notes (Signed)
[facility] departing with patient at this time.

## 2015-11-24 NOTE — ED Notes (Signed)
Report given to [facility] with [place]. Patient updated [facility] is en route. Patient requested pain meds. PA notified, orders received.

## 2015-11-25 ENCOUNTER — Inpatient Hospital Stay (HOSPITAL_COMMUNITY): Payer: Medicaid Other

## 2015-11-25 DIAGNOSIS — I509 Heart failure, unspecified: Secondary | ICD-10-CM

## 2015-11-25 DIAGNOSIS — J869 Pyothorax without fistula: Secondary | ICD-10-CM

## 2015-11-25 DIAGNOSIS — R0789 Other chest pain: Secondary | ICD-10-CM

## 2015-11-25 DIAGNOSIS — J9620 Acute and chronic respiratory failure, unspecified whether with hypoxia or hypercapnia: Secondary | ICD-10-CM | POA: Insufficient documentation

## 2015-11-25 DIAGNOSIS — E038 Other specified hypothyroidism: Secondary | ICD-10-CM

## 2015-11-25 DIAGNOSIS — J9601 Acute respiratory failure with hypoxia: Secondary | ICD-10-CM

## 2015-11-25 DIAGNOSIS — G894 Chronic pain syndrome: Secondary | ICD-10-CM

## 2015-11-25 DIAGNOSIS — R109 Unspecified abdominal pain: Secondary | ICD-10-CM | POA: Insufficient documentation

## 2015-11-25 DIAGNOSIS — R590 Localized enlarged lymph nodes: Secondary | ICD-10-CM | POA: Insufficient documentation

## 2015-11-25 DIAGNOSIS — J90 Pleural effusion, not elsewhere classified: Secondary | ICD-10-CM | POA: Insufficient documentation

## 2015-11-25 DIAGNOSIS — R599 Enlarged lymph nodes, unspecified: Secondary | ICD-10-CM

## 2015-11-25 DIAGNOSIS — F319 Bipolar disorder, unspecified: Secondary | ICD-10-CM

## 2015-11-25 LAB — RAPID URINE DRUG SCREEN, HOSP PERFORMED
AMPHETAMINES: NOT DETECTED
Amphetamines: NOT DETECTED
BENZODIAZEPINES: POSITIVE — AB
BENZODIAZEPINES: NOT DETECTED
Barbiturates: NOT DETECTED
Barbiturates: NOT DETECTED
COCAINE: NOT DETECTED
Cocaine: NOT DETECTED
OPIATES: POSITIVE — AB
OPIATES: NOT DETECTED
TETRAHYDROCANNABINOL: NOT DETECTED
Tetrahydrocannabinol: POSITIVE — AB

## 2015-11-25 LAB — CBC
HCT: 36.2 % (ref 36.0–46.0)
HEMATOCRIT: 36.8 % (ref 36.0–46.0)
Hemoglobin: 12.2 g/dL (ref 12.0–15.0)
Hemoglobin: 11.8 g/dL — ABNORMAL LOW (ref 12.0–15.0)
MCH: 28.6 pg (ref 26.0–34.0)
MCH: 29.2 pg (ref 26.0–34.0)
MCHC: 33.2 g/dL (ref 30.0–36.0)
MCHC: 32.6 g/dL (ref 30.0–36.0)
MCV: 86.4 fL (ref 78.0–100.0)
MCV: 89.6 fL (ref 78.0–100.0)
Platelets: 360 10*3/uL (ref 150–400)
Platelets: 337 10*3/uL (ref 150–400)
RBC: 4.26 MIL/uL (ref 3.87–5.11)
RBC: 4.04 MIL/uL (ref 3.87–5.11)
RDW: 13.9 % (ref 11.5–15.5)
RDW: 13.8 % (ref 11.5–15.5)
WBC: 24.6 10*3/uL — AB (ref 4.0–10.5)
WBC: 24.2 10*3/uL — ABNORMAL HIGH (ref 4.0–10.5)

## 2015-11-25 LAB — POCT I-STAT 3, ART BLOOD GAS (G3+)
Acid-Base Excess: 4 mmol/L — ABNORMAL HIGH (ref 0.0–2.0)
BICARBONATE: 31.2 meq/L — AB (ref 20.0–24.0)
O2 Saturation: 98 %
PCO2 ART: 56.3 mmHg — AB (ref 35.0–45.0)
PH ART: 7.351 (ref 7.350–7.450)
TCO2: 33 mmol/L (ref 0–100)
pO2, Arterial: 115 mmHg — ABNORMAL HIGH (ref 80.0–100.0)

## 2015-11-25 LAB — COMPREHENSIVE METABOLIC PANEL
ALT: 19 U/L (ref 14–54)
AST: 28 U/L (ref 15–41)
Albumin: 2.3 g/dL — ABNORMAL LOW (ref 3.5–5.0)
Alkaline Phosphatase: 119 U/L (ref 38–126)
Anion gap: 8 (ref 5–15)
BUN: <5 mg/dL — ABNORMAL LOW (ref 6–20)
CO2: 30 mmol/L (ref 22–32)
Calcium: 8.5 mg/dL — ABNORMAL LOW (ref 8.9–10.3)
Chloride: 103 mmol/L (ref 101–111)
Creatinine, Ser: 0.73 mg/dL (ref 0.44–1.00)
GFR calc Af Amer: >60 mL/min (ref >60)
GFR calc non Af Amer: >60 mL/min (ref >60)
Glucose, Bld: 144 mg/dL — ABNORMAL HIGH (ref 65–99)
Potassium: 3.7 mmol/L (ref 3.5–5.1)
Sodium: 141 mmol/L (ref 135–145)
Total Bilirubin: <0.1 mg/dL — ABNORMAL LOW (ref 0.3–1.2)
Total Protein: 6.5 g/dL (ref 6.5–8.1)

## 2015-11-25 LAB — BLOOD GAS, ARTERIAL
Acid-Base Excess: 0.6 mmol/L (ref 0.0–2.0)
BICARBONATE: 25.6 meq/L — AB (ref 20.0–24.0)
DRAWN BY: 331761
FIO2: 1
O2 Saturation: 88.5 %
PCO2 ART: 47.9 mmHg — AB (ref 35.0–45.0)
PH ART: 7.347 — AB (ref 7.350–7.450)
Patient temperature: 98.6
TCO2: 27.1 mmol/L (ref 0–100)
pO2, Arterial: 59.6 mmHg — ABNORMAL LOW (ref 80.0–100.0)

## 2015-11-25 LAB — PREPARE RBC (CROSSMATCH)

## 2015-11-25 LAB — BASIC METABOLIC PANEL WITH GFR
Anion gap: 11 (ref 5–15)
BUN: 5 mg/dL — ABNORMAL LOW (ref 6–20)
CO2: 25 mmol/L (ref 22–32)
Calcium: 8.4 mg/dL — ABNORMAL LOW (ref 8.9–10.3)
Chloride: 104 mmol/L (ref 101–111)
Creatinine, Ser: 0.72 mg/dL (ref 0.44–1.00)
GFR calc Af Amer: >60 mL/min
GFR calc non Af Amer: >60 mL/min
Glucose, Bld: 114 mg/dL — ABNORMAL HIGH (ref 65–99)
Potassium: 4.2 mmol/L (ref 3.5–5.1)
Sodium: 140 mmol/L (ref 135–145)

## 2015-11-25 LAB — BRAIN NATRIURETIC PEPTIDE: B Natriuretic Peptide: 176.2 pg/mL — ABNORMAL HIGH (ref 0.0–100.0)

## 2015-11-25 LAB — HEPATITIS B SURFACE ANTIGEN: Hepatitis B Surface Ag: NEGATIVE

## 2015-11-25 LAB — APTT: aPTT: 26 seconds (ref 24–37)

## 2015-11-25 LAB — GLUCOSE, CAPILLARY
GLUCOSE-CAPILLARY: 194 mg/dL — AB (ref 65–99)
GLUCOSE-CAPILLARY: 145 mg/dL — AB (ref 65–99)

## 2015-11-25 LAB — HEPATITIS B SURFACE ANTIBODY, QUANTITATIVE

## 2015-11-25 LAB — ABO/RH: ABO/RH(D): A POS

## 2015-11-25 LAB — MRSA PCR SCREENING: MRSA BY PCR: NEGATIVE

## 2015-11-25 LAB — STREP PNEUMONIAE URINARY ANTIGEN: STREP PNEUMO URINARY ANTIGEN: NEGATIVE

## 2015-11-25 LAB — PROTIME-INR
INR: 1.37 (ref 0.00–1.49)
Prothrombin Time: 17 seconds — ABNORMAL HIGH (ref 11.6–15.2)

## 2015-11-25 MED ORDER — FUROSEMIDE 10 MG/ML IJ SOLN
40.0000 mg | Freq: Once | INTRAMUSCULAR | Status: AC
  Administered 2015-11-25: 40 mg via INTRAVENOUS
  Filled 2015-11-25: qty 4

## 2015-11-25 MED ORDER — LIDOCAINE HCL 1 % IJ SOLN
INTRAMUSCULAR | Status: AC
  Filled 2015-11-25: qty 20

## 2015-11-25 MED ORDER — PREDNISONE 20 MG PO TABS
40.0000 mg | ORAL_TABLET | Freq: Every day | ORAL | Status: DC
  Administered 2015-11-25: 40 mg via ORAL
  Filled 2015-11-25 (×2): qty 2

## 2015-11-25 MED ORDER — ALBUTEROL SULFATE (2.5 MG/3ML) 0.083% IN NEBU
2.5000 mg | INHALATION_SOLUTION | RESPIRATORY_TRACT | Status: DC
  Administered 2015-11-25: 2.5 mg via RESPIRATORY_TRACT
  Filled 2015-11-25: qty 3

## 2015-11-25 MED ORDER — VANCOMYCIN HCL 10 G IV SOLR
1500.0000 mg | Freq: Once | INTRAVENOUS | Status: AC
  Administered 2015-11-25: 1500 mg via INTRAVENOUS
  Filled 2015-11-25 (×2): qty 1500

## 2015-11-25 MED ORDER — DEXTROSE 5 % IV SOLN
1.5000 g | INTRAVENOUS | Status: AC
  Administered 2015-11-26: 1.5 g via INTRAVENOUS
  Filled 2015-11-25: qty 1.5

## 2015-11-25 MED ORDER — METHADONE HCL 10 MG PO TABS: 60.0000 mg | ORAL_TABLET | Freq: Every day | ORAL | Status: DC

## 2015-11-25 MED ORDER — METHYLPREDNISOLONE SODIUM SUCC 40 MG IJ SOLR
40.0000 mg | Freq: Two times a day (BID) | INTRAMUSCULAR | Status: DC
  Administered 2015-11-25 – 2015-11-26 (×2): 40 mg via INTRAVENOUS
  Filled 2015-11-25 (×3): qty 1

## 2015-11-25 MED ORDER — IPRATROPIUM-ALBUTEROL 0.5-2.5 (3) MG/3ML IN SOLN
3.0000 mL | Freq: Four times a day (QID) | RESPIRATORY_TRACT | Status: DC
  Administered 2015-11-25 – 2015-12-02 (×28): 3 mL via RESPIRATORY_TRACT
  Filled 2015-11-25 (×27): qty 3

## 2015-11-25 MED ORDER — VANCOMYCIN HCL IN DEXTROSE 1-5 GM/200ML-% IV SOLN
1000.0000 mg | Freq: Three times a day (TID) | INTRAVENOUS | Status: DC
  Administered 2015-11-25 – 2015-11-29 (×11): 1000 mg via INTRAVENOUS
  Filled 2015-11-25 (×13): qty 200

## 2015-11-25 NOTE — Progress Notes (Signed)
Attempted to place pt. On BIPAP 10/5 & pt. Immediately took BIPAP off & stated that she couldn't wear it. BIPAP was transported with pt. to 2MO9.

## 2015-11-25 NOTE — Progress Notes (Signed)
Heard pt yelling in hall. Found pt standing in doorway gasping for airway yelling that she was unable to breathe, gray in color and holding her IV pole. Pt was standing in puddle of Urine. Pt escorted back to bed and V/s obtained. O2 sats at 48% on RA. (Pt took O2 off to come to doorway). Placed back in bed 43-48% on RA, pt placed back on 2LO2 OS sats in the 50's at best. Rapid Response and MD notified and NRB placed on patient. Respiratory at Bedside, who administered PRN breathing treatment. Once on NRB pt O2 saturation slowly increased to 80-90 range. Pt work of breathing slowly improved as well. At this point Rapid Response and MD arrived at bedside. Once stabilized transferred to 2M09. Fiance Daniel notified of pt tsfr.

## 2015-11-25 NOTE — Progress Notes (Signed)
  Echocardiogram 2D Echocardiogram has been performed.  Kylie [name] 11/25/2015, 6:06 PM

## 2015-11-25 NOTE — Progress Notes (Signed)
Gas on non rebreather 15 L  PT cant not tolerate room air

## 2015-11-25 NOTE — Consult Note (Signed)
PULMONARY / CRITICAL CARE MEDICINE   Name: Kylie [name] MRN: [id number] DOB: 1970-10-10    ADMISSION DATE:  11/23/2015 CONSULTATION DATE:  6/26  REFERRING MD:  Erin Hearing   CHIEF COMPLAINT:  Progressive respiratory failure and abnormal CXR   HISTORY OF PRESENT ILLNESS:   45 y.o. female admitted 6/24 with cc: progressive shortness of breath. Reported it had been present for the last month, coming and going. Over the last two weeks and especially the last couple of days, dyspnea has gotten worse, and patient has had dry cough. She has had associated right UQ pain and right lower chest pain, worsened by cough. Cough has also caused loss of bladder control. She reported having subjective fever "a couple of times" over the last month. She denies sick contacts. She has not been hospitalized or had antibiotics within the last 3 months. She has not tried any medicines for her symptoms. Some nausea but no v/d. She was admitted to the IM service. Treated w/ working dx of CAP. PCCM asked to see on 6/26 when pt found confused and more hypoxic w/ sats in 40s on room air.   PAST MEDICAL HISTORY :  She  has a past medical history of Hepatitis B infection; Cervical cancer ([place]); Degenerative disk disease; Ovarian cyst; Chronic UTI; Anxiety; Bipolar 1 disorder ([place]); and Panic attack.  PAST SURGICAL HISTORY: She  has past surgical history that includes Tubal ligation; Mandible fracture surgery; Wisdom tooth extraction; and Cervix surgery.  Allergies  Allergen Reactions  . Pregabalin Anaphylaxis  . Hydrocodone Itching and Rash  . Citalopram     Worsened anxiety  . Ibuprofen     Stomach pain  . Naproxen Swelling    troat and fingers swelling   . Povidone-Iodine Itching and Rash    No current facility-administered medications on file prior to encounter.   Current Outpatient Prescriptions on File Prior to Encounter  Medication Sig  . baclofen (LIORESAL) 10 MG tablet Take 1 tablet (10 mg total) by  mouth 3 (three) times daily.  Marland Kitchen gabapentin (NEURONTIN) 300 MG capsule Take 1 capsule (300 mg total) by mouth 3 (three) times daily.  . hydrOXYzine (VISTARIL) 25 MG capsule Take 1 capsule (25 mg total) by mouth 3 (three) times daily as needed for anxiety.  Marland Kitchen levothyroxine (SYNTHROID, LEVOTHROID) 50 MCG tablet Take 1 tablet (50 mcg total) by mouth daily.  Marland Kitchen lithium 300 MG tablet Take 1 tablet (300 mg total) by mouth 2 (two) times daily.  . methadone (DOLOPHINE) 10 MG tablet Take 10 mg by mouth daily.   . QUEtiapine (SEROQUEL XR) 300 MG 24 hr tablet Take 1 tablet (300 mg total) by mouth at bedtime.    FAMILY HISTORY:  Her has no family status information on file.   SOCIAL HISTORY: She  reports that she has been smoking Cigarettes.  She has a 25 pack-year smoking history. She has never used smokeless tobacco. She reports that she does not drink alcohol or use illicit drugs.  REVIEW OF SYSTEMS:   Unable   SUBJECTIVE:  Feels short of breath   VITAL SIGNS: BP 125/110 mmHg  Pulse 102  Temp(Src) 98.5 F (36.9 C) (Oral)  Resp 21  Ht 5\' 9"  (1.753 m)  Wt 216 lb 6.4 oz (98.158 kg)  BMI 31.94 kg/m2  SpO2 94%  LMP 11/23/2015  HEMODYNAMICS:    VENTILATOR SETTINGS: Vent Mode:  [-]  FiO2 (%):  [100 %] 100 %  INTAKE / OUTPUT: I/O last 3 completed  shifts: In: 3693.3 [P.O.:2022; I.V.:1621.3; IV Piggyback:50] Out: 0   PHYSICAL EXAMINATION: General:  Acutely ill appearing 45 year old female, + accessory muscle use w/ loud audible exp wheeze  Neuro:  Awake, has intermittent muscle jerking, oriented X 2-3, moves all ext. No focal def  HEENT:  NCAT, MM are moist. She has loud upper airway wheeze  Cardiovascular:  RRR, no MRG  Lungs:  Exp wheeze which is predominately upper airway. + accessory use decreased bases  Abdomen:  Soft, not currently tender  Musculoskeletal:  Equal st and bulk  Skin:  Warm and dry   LABS:  BMET  Recent Labs Lab 11/23/15 1551 11/24/15 0439  NA 135 132*   K 4.1 3.8  CL 99* 97*  CO2 26 26  BUN 6 <5*  CREATININE 0.79 0.65  GLUCOSE 165* 144*    Electrolytes  Recent Labs Lab 11/23/15 1551 11/24/15 0439  CALCIUM 8.7* 8.2*    CBC  Recent Labs Lab 11/23/15 1551 11/24/15 0439  WBC 17.3* 16.6*  HGB 12.6 11.4*  HCT 38.1 36.0  PLT 402* 385    Coag's No results for input(s): APTT, INR in the last 168 hours.  Sepsis Markers No results for input(s): LATICACIDVEN, PROCALCITON, O2SATVEN in the last 168 hours.  ABG  Recent Labs Lab 11/25/15 1143  PHART 7.347*  PCO2ART 47.9*  PO2ART 59.6*    Liver Enzymes  Recent Labs Lab 11/23/15 1551  AST 17  ALT 8*  ALKPHOS 94  BILITOT 0.4  ALBUMIN 3.3*    Cardiac Enzymes  Recent Labs Lab 11/24/15 0439  TROPONINI <0.03    Glucose  Recent Labs Lab 11/25/15 1123  GLUCAP 194*    Imaging Dg Chest Port 1 View  11/25/2015  CLINICAL DATA:  Shortness of Breath EXAM: PORTABLE CHEST 1 VIEW COMPARISON:  Chest radiograph April 04, 2014; chest CT November 23, 2015 FINDINGS: There is extensive loculated effusion on the right. There is widespread interstitial and alveolar edema, progressed from 2 days prior. Heart is upper normal in size with evidence of a degree of pulmonary venous hypertension. No adenopathy evident. IMPRESSION: Widespread interstitial and alveolar opacity. Suspect pulmonary edema, although there may well be superimposed pneumonia. There is extensive loculated effusion on the right. There has been progression of interstitial and alveolar opacity compared to 2 days prior. Electronically Signed   By: Lowella Grip III M.D.   On: 11/25/2015 12:21     STUDIES:  CT chest 6/24: 1. Consolidated lung in the RIGHT lower lobe with differential including pneumonia versus neoplasm. 2. Peribronchial and perihilar thickening on the RIGHT consistent with infection or neoplasm. 3. Mild mediastinal adenopathy differential including metastatic adenopathy versus reactive  adenopathy 4. Partially loculated pleural fluid in the lower RIGHT thorax.  CULTURES: BCX2 6/26>>> HIV 6/26>>> U strep 6/26>>>  ANTIBIOTICS: Rocephin 6/24>>> azith 6/25>>> vanc 6/26>>>  SIGNIFICANT EVENTS:   LINES/TUBES:   DISCUSSION: Progressive acute hypoxic respiratory failure in setting of CAP w/ evolving ARDS and loculated effusion. She also has a component of what seems to be vocal cord dysfunction and possibly substance w/d that is making this more complicated. We will keep her in the ICU, get IR to do bedside thora & also consider CVTS consult (may need VATs). Think she is at very high risk for needing to be intubated.   ASSESSMENT / PLAN:  PULMONARY A: Acute hypoxic respiratory failure in setting of CAP w/ associated acute lung injury/evolving ARDS Loculated right effusion  Upper airway wheezing/ VCD  P:   Supplemental oxygen  Scheduled BDs Systemic steroids  Have asked IR to see for possible bedside US Will also consider CVTS consult   CARDIOVASCULAR A:  SIRS/sepsis  P:  Tele Keep even vol status  See ID section   RENAL A:   No acute  P:   Trend cmp Strict I&O Renal dose meds as needed   GASTROINTESTINAL A:   abd pain-->resolved P:  NPO  HEMATOLOGIC A:   Mild anemia  P:  Rudy LMWH Trend CBC Transfuse per ICU protocol   INFECTIOUS A:   CAP w/ associated loculated right effusion H/o Hep B P:   Cont abx  Ordered US guided thora-->but may ultimately need chest drainage via VATS  OR CT +/- TPA and DNase Will d/w Dr Elsworth Soho re: timing of CVTS consult   ENDOCRINE A:   Hypothyroidism  P:   Cont synthroid   NEUROLOGIC A:   Anxiety  H/o bipolar disease Chronic pain Acute encephalopathy   P:   RASS goal: 0 Supportive care    FAMILY  - Updates:   - Inter-disciplinary family meet or Palliative Care meeting due by:  [place] ACNP-BC [place] Pager # [phone] OR # [phone] if no  answer   11/25/2015, 1:41 PM

## 2015-11-25 NOTE — Progress Notes (Signed)
Bedside US requested for poss right thoracentesis CT Chest images viewed and compared.  Densely loculated right effusion with thickened septations and very little appreciable free fluid. Thoracentesis likley to be low yield and certainly not therapeutic. Procedure not performed.   Ascencion Dike PA-C Interventional Radiology 11/25/2015 5:15 PM

## 2015-11-25 NOTE — Significant Event (Signed)
Rapid Response Event Note  Overview:  Asked to assist with patient with resp distress Time Called: 1118 Arrival Time: 1123 Event Type: Respiratory  Initial Focused Assessment:  On arrival patient sitting on edge of bed - alert but will fall asleep quickly - general jerking motions of arms - color grey - diaphoretic - answers questions appropriately - anxious - pulling at O2 - staff reports she was fine 10 mins prior to event - sleeping in the bed - patient states she was wakened by a sharp pain in her right side - she pulled O2 off and the got out of bed - staff found her at the doorway - grey - O2 sats were very low - they applied NRB mask - bil BS present decreased RLL - crackles noted bilateral bases- audible wheeze noted - sounds upper airway - RR rapid 42 - O2 sats 84% on NRB mask - using accessory muscles - BP 158/108.  Dr Brett Albino to bedside.    Interventions: Stat Albuterol nebulizer given - stat ABG - stat call for PCXR.  Patient reassured - calmed room.  Tol nebulizer - some relief of WOB.  Remains able to talk - oriented to place self - transferred to bed - tol well - attempted Bipap - tol mask for few minutes - but when straps applied she became extremely agitated - ripped mask off - replaced NRB mask.  Dr. Brett Albino aware. PCXR done.  ABG reviewed - Dr. Brett Albino aware.  RR 32 - O2 sats 94% HR 117.  Continues with muscle jerks - had Methadone this AM.  Dr. Elsworth Soho consulted by Dr. Brett Albino.  Alerted Dr. Elsworth Soho of transfer to 2M09.  Patient tol transfer.  Handoff to [employer].    Plan of Care (if not transferred):  Event Summary: Name of Physician Notified: Dr. Brett Albino at  (pta RRT)  Name of Consulting Physician Notified: Dr. Elsworth Soho at    Outcome: Transferred (Comment) (56m09)  Event End Time: 1230  Quin Hoop

## 2015-11-25 NOTE — Progress Notes (Signed)
Patient called for assistance to the bathroom. Upon entry she complained of pain, she had refused her Toradol and Tylenol earlier on. RN gave Toradol to manage pain as well as warm packs for right side of her abdomen. Patient asked when her methadone was due and that staff have not been giving her her medicines. Medications including time and frequency discussed with patient. Will continue to monitor.

## 2015-11-25 NOTE — Progress Notes (Signed)
Notified by RN regarding rapid response called on Pt. Per RN, Pt was noted to be standing in the doorway in a puddle of urine calling out for helping, stating that she couldn't breathe. She appeared very grey and did not have her O2 on. She was helped back to the bed. Pulse ox was 43% and then 48%. She was started on 6L O2 by [place] and O2 sats remained at 70-75%. She was transitioned to non-rebreather with improvement in O2 sats to high 80s-low 90s.   - Will transfer to stepdown unit - Transition from non-rebreather to BiPAP - Will get CXR and ABG - Continue to monitor closely - There is likely an anxiety component to her respiratory distress, but do not want to give benzos because she has been on Xanax for 25 years and was just weaned off 2 months ago. UDS+ for benzos. - Will get pulm consult for possible bronch

## 2015-11-25 NOTE — Progress Notes (Signed)
I am the PCP for "[name]" and made a social visit.  I greatly appreciate the excellent care of the critical care team and the FPTS.  All I have to add is a warning and a bit of perspective.  The warning: you cannot trust anything Kylie [name] says about her drug usage.  She is a longstanding polysubstance user and has repeatedly lied about usage.  My main point is that she may be ingesting more than the marijuana, benzo and narcotic that showed up on her latest drug screen.  She has used cocaine in the past.  Perspective: both her mother and her aunt (mother's sister) died of unintentional narcotic overdoses if that provides insight into her genetics and family upbringing.  Please understand that I care for [facility] despite her behavior.  Please let me know if I can be of assistance during this admit.

## 2015-11-25 NOTE — Progress Notes (Signed)
Appears to be resting comfortably. Satting 100% on Nonrebreather. Will continue to monitor.  Dr. Gerlean Ren 11/25/15, 10:23 PM

## 2015-11-25 NOTE — Progress Notes (Signed)
Family Medicine Teaching Service Daily Progress Note Intern Pager: [phone]  Patient name: Kylie [name] Medical record number: [id number] Date of birth: 1970/12/15 Age: 45 y.o. Gender: female  Primary Care Provider: Zigmund Gottron, MD Consultants: None Code Status: Full  Pt Overview and Major Events to Date:  6/25: Admitted to FMTS with RLL pneumonia and hypoxia with new O2 requirement  Assessment and Plan: Kylie [name] is a 45 y.o. female presenting with SOB, cough, and right-sided lower chest and upper abdominal pain. PMH is significant for bipolar disorder, chronic pain, drug abuse, tobacco abuse, and hepatitis B.   Shortness of breath: Thought to be secondary to RLL pneumonia on CTA. Clinical picture is inconsistent with pneumonia, as Pt does not have fever or productive cough. New oxygen requirement with desat to 84% on RA at OSH. Remained on 2L O2 overnight with O2 sats ranging from 90-94%. Afebrile with mild tachypnea to 22 and mild tachycardia to 102. Anxiety may be contributing. WBC 17.3 > 16.6 > pending from this morning. On exam, mildly increased work of breathing, diffuse inspiratory and expiratory wheezing, crackles over right middle lobe. - Currently on Ceftriaxone and Azithromycin. Will continue IV abx today.  - Pulm consult for possible bronch - Will change Albuterol from prn to scheduled for today. - Given her diffuse inspiratory and expiratory wheezing and increased work of breathing with a history of smoking 1 ppd, will give 5 day burst of Prednisone as adjunct therapy. - Supplemental O2 as needed to maintain O2 sats >/= 92%. - Wean O2 as tolerated - Blood cultures drawn today (after antibiotics) - Ambulatory pulse ox prior to discharge - Repeat CXR in 6 weeks  Mediastinal Lymphadenopathy: Seen on CTA chest. Reactive adenopathy in the setting of CAP vs metastatic lymphadenopathy. +Fhx of breast cancer in mother. - Will consider cancer work up if symptoms do  not improve with antibiotics. - Pulm consult for possible bronchoscopy for evaluation RLL masslike consolidation - Will check an HIV, as Pt is high risk and has not had HIV checked since 2009.  Atypical chest pain: Most likely chest wall pain in setting of pneumonia; worsened with cough. Also anxiety may be contributory. EKG with diffuse T wave flattening, otherwise unremarkable. Troponin negative x 1. - Continue to monitor. - Scheduled Tylenol and Toradol for pain  Abdominal pain: Abdominal U/S at OSH showed no evidence of cholecystitis but with mild dilation of common bile duct and right pleural effusion. Lipase < 10. UA negative for nitrites and leukocytes but with small hemoglobin, ketones, and protein. Tbili WNL. S/p IV morphine 4 mg and dilaudid 1 mg x 2 at OSH.  - Continue to monitor  Bipolar I disorder: Lithium level on admission: 0.70 (L). - Continue home lithium 300 mg BID, hydroxyzine 25 mg TID, quetiapine 300 mg QHS  Hypothyroidism: TSH 7.61 on 10/02/15 - Continue home levothyroxine 50 mcg daily - Repeat TSH in 1 month  Hx of abnormal Pap Smear: Had LEEP 07/2010 after high grade lesion seen on colposcopy, performed after abnormal pap smear. She has had negative pap smears since. 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  - Repeat pap smear in 1 year  Chronic pain: Patient reports she takes 120 mg methadone daily from the clinic on [facility]. 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  - Continue home baclofen 10 mg TID, gabapentin 300 mg TID - Continue methadone 120 mg daily  Tobacco abuse: Smokes 1 ppd. - Nicotine patch 21 mg daily, per patient preference  Hepatitis B: Surface Antigen still positive 01/26/2008 with negative Hep B S Ab. No transamininitis on admission with AST 17 and ALT 8.  - Hep B surface Ab and Ag pending  FEN/GI: Regular diet. Will d/c fluids today, as Pt has had good PO  intake. Prophylaxis: lovenox  Disposition: Pending ability to wean off O2  Subjective:  Pt states that her breathing has not improved since she has been hospitalized. She continues to have chest wall pain this morning. No other concerns.  Objective: Temp:  [98.3 F (36.8 C)-99.7 F (37.6 C)] 98.5 F (36.9 C) (06/26 0439) Pulse Rate:  [95-105] 102 (06/26 0439) Resp:  [21-24] 22 (06/26 0439) BP: (82-127)/(64-79) 115/68 mmHg (06/26 0439) SpO2:  [90 %-95 %] 94 % (06/26 0439) Weight:  [216 lb 6.4 oz (98.158 kg)] 216 lb 6.4 oz (98.158 kg) (06/25 2213) Physical Exam: General: Sitting up in bed, [place] in place HEENT: [place]/AT, EOMI, MMM Neck: Supple, no cervical lymphadenopathy  Cardiovascular: RRR, S1, S2, no m/r/g Respiratory: Diffuse inspiratory and expiratory wheezing in all lung fields, crackles over the right middle lung field..  Abdomen: +BS, soft, NT, ND, no rebound or guarding MSK: Moves all extremities spontaneously, no LE edema Skin: No rashes or lesions appreciated Neuro: AOx3, no focal deficits Psych: Appropriate mood and affect  Laboratory:  Recent Labs Lab 11/23/15 1551 11/24/15 0439  WBC 17.3* 16.6*  HGB 12.6 11.4*  HCT 38.1 36.0  PLT 402* 385    Recent Labs Lab 11/23/15 1551 11/24/15 0439  NA 135 132*  K 4.1 3.8  CL 99* 97*  CO2 26 26  BUN 6 <5*  CREATININE 0.79 0.65  CALCIUM 8.7* 8.2*  PROT 7.6  --   BILITOT 0.4  --   ALKPHOS 94  --   ALT 8*  --   AST 17  --   GLUCOSE 165* 144*   UDS: positive for opiates, benzos, and THC Troponin I: <0.03 Lipase: <10 UA: small Hgb, small bili, 15 ketones, 30 protein, negative leukocytes, negative nitrites Lithium: 0.70 (L)  Imaging/Diagnostic Tests: CTA Chest: large consolidation of RLL with peribronchial and perihilar thickening and paritally loculated fluid. Moderate mediastinal lymphadenopathy concerning for reactive adenopathy in setting of infection vs. metastatic lymphadenopathy.  Sela Hua,  MD 11/25/2015, 8:04 AM PGY-1, [place] Intern pager: [phone], text pages welcome

## 2015-11-25 NOTE — Progress Notes (Signed)
Pharmacy Antibiotic Note  Kylie [name] is a 45 y.o. female admitted on 11/23/2015 with pneumonia.  Pharmacy has been consulted for vancomycin dosing. Pt has been on azithromycin and ceftriaxone for suspected CAP.   Pt received vancomycin 1500mg  IV load.  Plan: Vancomycin 1g IV every 8 hours.  Goal trough 15-20 mcg/mL.  Ceftriaxone 1g IV q24h Azithromycin 500mg  IV q24h Monitor culture data, renal function and clinical course VT at SS prn  Height: 5\' 9"  (175.3 cm) Weight: 216 lb 6.4 oz (98.158 kg) IBW/kg (Calculated) : 66.2  Temp (24hrs), Avg:99 F (37.2 C), Min:98.3 F (36.8 C), Max:99.7 F (37.6 C)   Recent Labs Lab 11/23/15 1551 11/24/15 0439  WBC 17.3* 16.6*  CREATININE 0.79 0.65    Estimated Creatinine Clearance: 113.1 mL/min (by C-G formula based on Cr of 0.65).    Allergies  Allergen Reactions  . Pregabalin Anaphylaxis  . Hydrocodone Itching and Rash  . Citalopram     Worsened anxiety  . Ibuprofen     Stomach pain  . Naproxen Swelling    troat and fingers swelling   . Povidone-Iodine Itching and Rash    Antimicrobials this admission: CTX 6/24 >>  Azith 6/25 >>  Vanc 6/26 >>  Dose adjustments this admission: n/a  Microbiology results:  BCx:   UCx:    Sputum:    MRSA PCR:   Andrey Cota. Diona Foley, PharmD, [place] Clinical Pharmacist Pager [phone]  11/25/2015 1:21 PM

## 2015-11-25 NOTE — Progress Notes (Signed)
Procedure(s) (LRB): VIDEO ASSISTED THORACOSCOPY (VATS)/EMPYEMA (Right) Subjective: Patient examined, CT scan of chest personally reviewed Obese nondiabetic smoker with progressive pulmonary symptoms including shortness of breath productive cough for a right chest pain progressing in severity over the past 1-2 weeks. CT scan shows a large loculated effusion-empyema on the right side. Attempt by IR for thoracentesis was canceled because the loculated nature of the effusion on echo. The patient is in ICU on oxygen tachycardic and would benefit from right VATS drainage of empyema with possible bronchoscopy. This will be scheduled for tomorrow.  Objective: Vital signs in last 24 hours: Temp:  [98.3 F (36.8 C)-98.8 F (37.1 C)] 98.8 F (37.1 C) (06/26 1325) Pulse Rate:  [90-117] 97 (06/26 1700) Cardiac Rhythm:  [-] Normal sinus rhythm;Sinus tachycardia (06/26 1600) Resp:  [14-22] 18 (06/26 1700) BP: (70-125)/(55-110) 117/102 mmHg (06/26 1700) SpO2:  [91 %-99 %] 97 % (06/26 1700) FiO2 (%):  [100 %] 100 % (06/26 1600) Weight:  [216 lb 6.4 oz (98.158 kg)] 216 lb 6.4 oz (98.158 kg) (06/25 2213)  Hemodynamic parameters for last 24 hours:  stable  Intake/Output from previous day: 06/25 0701 - 06/26 0700 In: 3471.3 [P.O.:1800; I.V.:1621.3; IV Piggyback:50] Out: 0  Intake/Output this shift: Total I/O In: 510 [Other:10; IV Piggyback:500] Out: 2325 [Urine:2325]      Physical Exam  General: Obese middle-aged Caucasian female acutely ill diaphoretic Neck: Supple without JVD, adenopathy, or bruit Chest: Reduced breath sounds right base, tenderness over right lower chest    but without deformity Cardiovascular: Regular rate and rhythm, no murmur, no gallop, peripheral pulses             palpable in all extremities Abdomen:  Soft, nontender, no palpable mass or organomegaly Extremities: Warm, well-perfused, no clubbing cyanosis edema or tenderness,              no venous stasis changes of the  legs Rectal/GU: Deferred Neuro: Grossly non--focal and symmetrical throughout Skin: Clean and dry without rash or ulceration   Lab Results:  Recent Labs  11/24/15 0439 11/25/15 1339  WBC 16.6* 24.6*  HGB 11.4* 12.2  HCT 36.0 36.8  PLT 385 360   BMET:  Recent Labs  11/24/15 0439 11/25/15 1339  NA 132* 140  K 3.8 4.2  CL 97* 104  CO2 26 25  GLUCOSE 144* 114*  BUN <5* 5*  CREATININE 0.65 0.72  CALCIUM 8.2* 8.4*    PT/INR: No results for input(s): LABPROT, INR in the last 72 hours. ABG    Component Value Date/Time   PHART 7.347* 11/25/2015 1143   HCO3 25.6* 11/25/2015 1143   TCO2 27.1 11/25/2015 1143   ACIDBASEDEF 4.0* 08/26/2012 1613   O2SAT 88.5 11/25/2015 1143   CBG (last 3)   Recent Labs  11/25/15 1123 11/25/15 1312  GLUCAP 194* 145*    Assessment/Plan: S/P Procedure(s) (LRB): VIDEO ASSISTED THORACOSCOPY (VATS)/EMPYEMA (Right) I have discussed right VATS and drainage of empyema with the patient and her fianc. She understands use of general anesthesia, location the surgical incision, and the expected postoperative recovery. She understands the potential risks of persistent air leak, recurrent infection, prolonged ventilator dependence, tracheostomy, death. She agrees to proceed with surgery.    LOS: 2 days    Tharon Aquas Trigt III 11/25/2015

## 2015-11-26 ENCOUNTER — Inpatient Hospital Stay (HOSPITAL_COMMUNITY): Payer: Medicaid Other

## 2015-11-26 ENCOUNTER — Encounter (HOSPITAL_COMMUNITY): Payer: Self-pay | Admitting: Anesthesiology

## 2015-11-26 ENCOUNTER — Inpatient Hospital Stay (HOSPITAL_COMMUNITY): Payer: Medicaid Other | Admitting: Anesthesiology

## 2015-11-26 ENCOUNTER — Encounter (HOSPITAL_COMMUNITY)
Admission: EM | Disposition: A | Discharge status: Home / Self Care | Payer: Self-pay | Source: Home / Self Care | Attending: Family Medicine

## 2015-11-26 DIAGNOSIS — R1010 Upper abdominal pain, unspecified: Secondary | ICD-10-CM

## 2015-11-26 DIAGNOSIS — J9600 Acute respiratory failure, unspecified whether with hypoxia or hypercapnia: Secondary | ICD-10-CM | POA: Insufficient documentation

## 2015-11-26 HISTORY — PX: VIDEO BRONCHOSCOPY: SHX5072

## 2015-11-26 HISTORY — PX: VIDEO ASSISTED THORACOSCOPY (VATS)/EMPYEMA: SHX6172

## 2015-11-26 LAB — ECHOCARDIOGRAM COMPLETE
E decel time: 230 msec
E/e' ratio: 4.46
FS: 30 % (ref 28–44)
Height: 69 in
IVS/LV PW RATIO, ED: 1.03
LA ID, A-P, ES: 35 mm
LA diam end sys: 35 mm
LA diam index: 1.58 cm/m2
LA vol A4C: 49 ml
LV E/e' medial: 4.46
LV E/e'average: 4.46
LV PW d: 9.21 mm — AB (ref 0.6–1.1)
LV e' LATERAL: 15 cm/s
LVOT area: 2.84 cm2
LVOT diameter: 19 mm
MV Dec: 230
MV pk A vel: 67.4 m/s
MV pk E vel: 66.9 m/s
TDI e' lateral: 15
TDI e' medial: 10.4
Weight: 3462.4 oz

## 2015-11-26 LAB — BASIC METABOLIC PANEL
Anion gap: 9 (ref 5–15)
BUN: 6 mg/dL (ref 6–20)
CHLORIDE: 103 mmol/L (ref 101–111)
CO2: 30 mmol/L (ref 22–32)
Calcium: 8.6 mg/dL — ABNORMAL LOW (ref 8.9–10.3)
Creatinine, Ser: 0.69 mg/dL (ref 0.44–1.00)
GFR calc Af Amer: >60 mL/min (ref >60)
GFR calc non Af Amer: >60 mL/min (ref >60)
GLUCOSE: 120 mg/dL — AB (ref 65–99)
POTASSIUM: 3.6 mmol/L (ref 3.5–5.1)
SODIUM: 142 mmol/L (ref 135–145)

## 2015-11-26 LAB — POCT I-STAT 3, ART BLOOD GAS (G3+)
Acid-Base Excess: 7 mmol/L — ABNORMAL HIGH (ref 0.0–2.0)
Bicarbonate: 33.3 mEq/L — ABNORMAL HIGH (ref 20.0–24.0)
O2 Saturation: 99 %
PH ART: 7.383 (ref 7.350–7.450)
TCO2: 35 mmol/L (ref 0–100)
pCO2 arterial: 55.9 mmHg — ABNORMAL HIGH (ref 35.0–45.0)
pO2, Arterial: 151 mmHg — ABNORMAL HIGH (ref 80.0–100.0)

## 2015-11-26 LAB — BLOOD GAS, ARTERIAL
Acid-Base Excess: 7.2 mmol/L — ABNORMAL HIGH (ref 0.0–2.0)
Bicarbonate: 32.5 mEq/L — ABNORMAL HIGH (ref 20.0–24.0)
DRAWN BY: 441661
FIO2: 1
O2 CONTENT: 15 L/min
O2 Saturation: 97.2 %
Patient temperature: 98.6
TCO2: 34.3 mmol/L (ref 0–100)
pCO2 arterial: 58.2 mmHg (ref 35.0–45.0)
pH, Arterial: 7.366 (ref 7.350–7.450)
pO2, Arterial: 99.2 mmHg (ref 80.0–100.0)

## 2015-11-26 LAB — SURGICAL PCR SCREEN
MRSA, PCR: NEGATIVE
STAPHYLOCOCCUS AUREUS: NEGATIVE

## 2015-11-26 LAB — URINE CULTURE: Culture: NO GROWTH

## 2015-11-26 LAB — CBC
HCT: 34.2 % — ABNORMAL LOW (ref 36.0–46.0)
HEMOGLOBIN: 10.9 g/dL — AB (ref 12.0–15.0)
MCH: 28.2 pg (ref 26.0–34.0)
MCHC: 31.9 g/dL (ref 30.0–36.0)
MCV: 88.4 fL (ref 78.0–100.0)
Platelets: 349 10*3/uL (ref 150–400)
RBC: 3.87 MIL/uL (ref 3.87–5.11)
RDW: 13.9 % (ref 11.5–15.5)
WBC: 21.4 10*3/uL — AB (ref 4.0–10.5)

## 2015-11-26 LAB — GLUCOSE, CAPILLARY
GLUCOSE-CAPILLARY: 226 mg/dL — AB (ref 65–99)
Glucose-Capillary: 169 mg/dL — ABNORMAL HIGH (ref 65–99)

## 2015-11-26 LAB — HIV ANTIBODY (ROUTINE TESTING W REFLEX): HIV Screen 4th Generation wRfx: NONREACTIVE

## 2015-11-26 SURGERY — VIDEO ASSISTED THORACOSCOPY (VATS)/EMPYEMA
Anesthesia: General | Site: Chest | Laterality: Right

## 2015-11-26 MED ORDER — ACETAMINOPHEN 160 MG/5ML PO SOLN
1000.0000 mg | Freq: Four times a day (QID) | ORAL | Status: DC
  Administered 2015-11-26 – 2015-12-01 (×18): 1000 mg via ORAL
  Filled 2015-11-26 (×20): qty 40.6

## 2015-11-26 MED ORDER — ROCURONIUM BROMIDE 100 MG/10ML IV SOLN
INTRAVENOUS | Status: DC | PRN
  Administered 2015-11-26: 20 mg via INTRAVENOUS
  Administered 2015-11-26 (×2): 30 mg via INTRAVENOUS
  Administered 2015-11-26: 20 mg via INTRAVENOUS

## 2015-11-26 MED ORDER — BISACODYL 5 MG PO TBEC
10.0000 mg | DELAYED_RELEASE_TABLET | Freq: Every day | ORAL | Status: DC
  Administered 2015-11-29 – 2015-12-03 (×2): 10 mg via ORAL
  Filled 2015-11-26 (×4): qty 2

## 2015-11-26 MED ORDER — METHADONE HCL 10 MG PO TABS: 20.0000 mg | ORAL_TABLET | Freq: Three times a day (TID) | ORAL | Status: DC

## 2015-11-26 MED ORDER — METHADONE HCL 5 MG/5ML PO SOLN: 20.0000 mg | Freq: Three times a day (TID) | ORAL | Status: DC

## 2015-11-26 MED ORDER — ONDANSETRON HCL 4 MG/2ML IJ SOLN
INTRAMUSCULAR | Status: AC
  Filled 2015-11-26: qty 2

## 2015-11-26 MED ORDER — KETAMINE HCL 100 MG/ML IJ SOLN
INTRAMUSCULAR | Status: AC
  Filled 2015-11-26: qty 1

## 2015-11-26 MED ORDER — INSULIN ASPART 100 UNIT/ML ~~LOC~~ SOLN
0.0000 [IU] | Freq: Four times a day (QID) | SUBCUTANEOUS | Status: DC
  Administered 2015-11-26: 2 [IU] via SUBCUTANEOUS
  Administered 2015-11-27: 8 [IU] via SUBCUTANEOUS

## 2015-11-26 MED ORDER — SENNOSIDES-DOCUSATE SODIUM 8.6-50 MG PO TABS
1.0000 | ORAL_TABLET | Freq: Every day | ORAL | Status: DC
Start: 2015-11-27 — End: 2015-11-28
  Administered 2015-11-27: 1 via ORAL
  Filled 2015-11-26: qty 1

## 2015-11-26 MED ORDER — ONDANSETRON HCL 4 MG/2ML IJ SOLN
INTRAMUSCULAR | Status: DC | PRN
  Administered 2015-11-26: 4 mg via INTRAVENOUS

## 2015-11-26 MED ORDER — PROPOFOL 500 MG/50ML IV EMUL
INTRAVENOUS | Status: DC | PRN
  Administered 2015-11-26: 50 ug/kg/min via INTRAVENOUS

## 2015-11-26 MED ORDER — ONDANSETRON HCL 4 MG/2ML IJ SOLN
4.0000 mg | Freq: Four times a day (QID) | INTRAMUSCULAR | Status: DC | PRN
  Administered 2015-12-01 – 2015-12-02 (×2): 4 mg via INTRAVENOUS
  Filled 2015-11-26 (×2): qty 2

## 2015-11-26 MED ORDER — CHLORHEXIDINE GLUCONATE 0.12% ORAL RINSE (MEDLINE KIT)
15.0000 mL | Freq: Two times a day (BID) | OROMUCOSAL | Status: DC
  Administered 2015-11-26 – 2015-12-01 (×11): 15 mL via OROMUCOSAL

## 2015-11-26 MED ORDER — LACTATED RINGERS IV SOLN
INTRAVENOUS | Status: DC
  Administered 2015-11-26: 10:00:00 via INTRAVENOUS

## 2015-11-26 MED ORDER — KCL IN DEXTROSE-NACL 20-5-0.45 MEQ/L-%-% IV SOLN
INTRAVENOUS | Status: DC
  Administered 2015-11-26 – 2015-11-27 (×3): via INTRAVENOUS
  Filled 2015-11-26 (×5): qty 1000

## 2015-11-26 MED ORDER — FENTANYL CITRATE (PF) 250 MCG/5ML IJ SOLN
INTRAMUSCULAR | Status: AC
  Filled 2015-11-26: qty 5

## 2015-11-26 MED ORDER — SODIUM CHLORIDE 0.9 % IV SOLN
25.0000 ug/h | INTRAVENOUS | Status: DC
  Administered 2015-11-26: 300 ug/h via INTRAVENOUS
  Administered 2015-11-26: 50 ug/h via INTRAVENOUS
  Administered 2015-11-27: 250 ug/h via INTRAVENOUS
  Administered 2015-11-27: 300 ug/h via INTRAVENOUS
  Administered 2015-11-28: 200 ug/h via INTRAVENOUS
  Administered 2015-11-28: 250 ug/h via INTRAVENOUS
  Administered 2015-11-29: 275 ug/h via INTRAVENOUS
  Administered 2015-11-29 – 2015-11-30 (×2): 250 ug/h via INTRAVENOUS
  Administered 2015-11-30: 200 ug/h via INTRAVENOUS
  Administered 2015-11-30 – 2015-12-01 (×2): 250 ug/h via INTRAVENOUS
  Filled 2015-11-26 (×14): qty 50

## 2015-11-26 MED ORDER — PROPOFOL 10 MG/ML IV BOLUS
INTRAVENOUS | Status: AC
  Filled 2015-11-26: qty 20

## 2015-11-26 MED ORDER — FENTANYL CITRATE (PF) 100 MCG/2ML IJ SOLN: 25.0000 ug | INTRAMUSCULAR | Status: DC | PRN

## 2015-11-26 MED ORDER — MIDAZOLAM HCL 2 MG/2ML IJ SOLN
INTRAMUSCULAR | Status: AC
  Filled 2015-11-26: qty 2

## 2015-11-26 MED ORDER — GABAPENTIN 300 MG PO CAPS
300.0000 mg | ORAL_CAPSULE | Freq: Three times a day (TID) | ORAL | Status: DC
  Administered 2015-11-27: 300 mg via ORAL
  Filled 2015-11-26 (×2): qty 1

## 2015-11-26 MED ORDER — PIPERACILLIN-TAZOBACTAM 3.375 G IVPB: 3.3750 g | Freq: Four times a day (QID) | INTRAVENOUS | Status: DC

## 2015-11-26 MED ORDER — PROPOFOL 1000 MG/100ML IV EMUL
INTRAVENOUS | Status: AC
  Filled 2015-11-26: qty 100

## 2015-11-26 MED ORDER — METHYLPREDNISOLONE SODIUM SUCC 40 MG IJ SOLR: 40.0000 mg | Freq: Every day | INTRAMUSCULAR | Status: DC

## 2015-11-26 MED ORDER — FENTANYL CITRATE (PF) 100 MCG/2ML IJ SOLN
INTRAMUSCULAR | Status: DC | PRN
  Administered 2015-11-26 (×2): 100 ug via INTRAVENOUS
  Administered 2015-11-26: 50 ug via INTRAVENOUS
  Administered 2015-11-26 (×2): 150 ug via INTRAVENOUS
  Administered 2015-11-26 (×2): 100 ug via INTRAVENOUS

## 2015-11-26 MED ORDER — METHADONE HCL 10 MG PO TABS
20.0000 mg | ORAL_TABLET | Freq: Three times a day (TID) | ORAL | Status: DC
  Administered 2015-11-26: 20 mg
  Filled 2015-11-26: qty 2

## 2015-11-26 MED ORDER — SUCCINYLCHOLINE CHLORIDE 20 MG/ML IJ SOLN
INTRAMUSCULAR | Status: DC | PRN
  Administered 2015-11-26: 120 mg via INTRAVENOUS

## 2015-11-26 MED ORDER — PIPERACILLIN-TAZOBACTAM 3.375 G IVPB
3.3750 g | Freq: Three times a day (TID) | INTRAVENOUS | Status: DC
  Administered 2015-11-26 – 2015-12-02 (×18): 3.375 g via INTRAVENOUS
  Filled 2015-11-26 (×22): qty 50

## 2015-11-26 MED ORDER — ROCURONIUM BROMIDE 50 MG/5ML IV SOLN
INTRAVENOUS | Status: AC
  Filled 2015-11-26: qty 1

## 2015-11-26 MED ORDER — PROPOFOL 10 MG/ML IV BOLUS
INTRAVENOUS | Status: DC | PRN
  Administered 2015-11-26: 100 mg via INTRAVENOUS

## 2015-11-26 MED ORDER — ANTISEPTIC ORAL RINSE SOLUTION (CORINZ)
7.0000 mL | Freq: Four times a day (QID) | OROMUCOSAL | Status: DC
  Administered 2015-11-26 – 2015-12-02 (×21): 7 mL via OROMUCOSAL

## 2015-11-26 MED ORDER — KETAMINE HCL 100 MG/ML IJ SOLN
INTRAMUSCULAR | Status: DC | PRN
  Administered 2015-11-26: 100 mg via INTRAVENOUS

## 2015-11-26 MED ORDER — TRAMADOL HCL 50 MG PO TABS: 50.0000 mg | ORAL_TABLET | Freq: Four times a day (QID) | ORAL | Status: DC | PRN

## 2015-11-26 MED ORDER — LIDOCAINE 2% (20 MG/ML) 5 ML SYRINGE
INTRAMUSCULAR | Status: AC
  Filled 2015-11-26: qty 5

## 2015-11-26 MED ORDER — LIDOCAINE HCL (CARDIAC) 20 MG/ML IV SOLN
INTRAVENOUS | Status: DC | PRN
  Administered 2015-11-26: 80 mg via INTRAVENOUS

## 2015-11-26 MED ORDER — MIDAZOLAM HCL 5 MG/5ML IJ SOLN
INTRAMUSCULAR | Status: DC | PRN
  Administered 2015-11-26 (×2): 2 mg via INTRAVENOUS

## 2015-11-26 MED ORDER — MIDAZOLAM HCL 2 MG/2ML IJ SOLN: 2.0000 mg | INTRAMUSCULAR | Status: DC | PRN

## 2015-11-26 MED ORDER — 0.9 % SODIUM CHLORIDE (POUR BTL) OPTIME
TOPICAL | Status: DC | PRN
  Administered 2015-11-26: 2000 mL

## 2015-11-26 MED ORDER — PROPOFOL 1000 MG/100ML IV EMUL
5.0000 ug/kg/min | INTRAVENOUS | Status: DC
  Administered 2015-11-26 (×3): 50 ug/kg/min via INTRAVENOUS
  Administered 2015-11-27: 30 ug/kg/min via INTRAVENOUS
  Administered 2015-11-27: 40 ug/kg/min via INTRAVENOUS
  Administered 2015-11-27 – 2015-11-28 (×5): 30 ug/kg/min via INTRAVENOUS
  Administered 2015-11-29: 40 ug/kg/min via INTRAVENOUS
  Administered 2015-11-29: 30 ug/kg/min via INTRAVENOUS
  Administered 2015-11-29 (×3): 40 ug/kg/min via INTRAVENOUS
  Administered 2015-11-30: 25 ug/kg/min via INTRAVENOUS
  Administered 2015-11-30: 30 ug/kg/min via INTRAVENOUS
  Administered 2015-11-30 (×2): 40 ug/kg/min via INTRAVENOUS
  Administered 2015-12-01: 30 ug/kg/min via INTRAVENOUS
  Filled 2015-11-26 (×22): qty 100

## 2015-11-26 MED ORDER — MUPIROCIN 2 % EX OINT
TOPICAL_OINTMENT | CUTANEOUS | Status: AC
  Administered 2015-11-26: 1
  Filled 2015-11-26: qty 22

## 2015-11-26 MED ORDER — FAMOTIDINE IN NACL 20-0.9 MG/50ML-% IV SOLN
20.0000 mg | Freq: Two times a day (BID) | INTRAVENOUS | Status: DC
  Administered 2015-11-26 – 2015-11-28 (×5): 20 mg via INTRAVENOUS
  Filled 2015-11-26 (×6): qty 50

## 2015-11-26 MED ORDER — POTASSIUM CHLORIDE 10 MEQ/50ML IV SOLN
10.0000 meq | Freq: Every day | INTRAVENOUS | Status: DC | PRN
  Administered 2015-11-29 – 2015-11-30 (×4): 10 meq via INTRAVENOUS
  Filled 2015-11-26 (×5): qty 50

## 2015-11-26 MED ORDER — ACETAMINOPHEN 500 MG PO TABS
1000.0000 mg | ORAL_TABLET | Freq: Four times a day (QID) | ORAL | Status: DC
  Filled 2015-11-26 (×19): qty 2

## 2015-11-26 MED ORDER — QUETIAPINE FUMARATE 25 MG PO TABS
150.0000 mg | ORAL_TABLET | Freq: Two times a day (BID) | ORAL | Status: DC
  Administered 2015-11-26 – 2015-12-05 (×19): 150 mg
  Filled 2015-11-26: qty 1
  Filled 2015-11-26 (×2): qty 6
  Filled 2015-11-26 (×4): qty 1
  Filled 2015-11-26: qty 6
  Filled 2015-11-26 (×3): qty 1
  Filled 2015-11-26: qty 6
  Filled 2015-11-26 (×4): qty 1
  Filled 2015-11-26: qty 6
  Filled 2015-11-26: qty 1
  Filled 2015-11-26: qty 6
  Filled 2015-11-26: qty 1

## 2015-11-26 MED ORDER — LEVOTHYROXINE SODIUM 50 MCG PO TABS
50.0000 ug | ORAL_TABLET | Freq: Every day | ORAL | Status: DC
  Administered 2015-11-27 – 2015-12-05 (×9): 50 ug via ORAL
  Filled 2015-11-26 (×9): qty 1

## 2015-11-26 SURGICAL SUPPLY — 69 items
ADH SKN CLS APL DERMABOND .7 (GAUZE/BANDAGES/DRESSINGS) ×2
BAG DECANTER FOR FLEXI CONT (MISCELLANEOUS) IMPLANT
BLADE SURG 11 STRL SS (BLADE) ×2 IMPLANT
BRUSH CYTOL CELLEBRITY 1.5X140 (MISCELLANEOUS) ×3 IMPLANT
CANISTER SUCTION 2500CC (MISCELLANEOUS) ×4 IMPLANT
CATH KIT ON Q 5IN SLV (PAIN MANAGEMENT) IMPLANT
CATH ROBINSON RED A/P 22FR (CATHETERS) IMPLANT
CATH THORACIC 28FR (CATHETERS) IMPLANT
CATH THORACIC 36FR (CATHETERS) IMPLANT
CATH THORACIC 36FR RT ANG (CATHETERS) IMPLANT
CONN ST 1/4X3/8  BEN (MISCELLANEOUS) ×2
CONN ST 1/4X3/8 BEN (MISCELLANEOUS) ×1 IMPLANT
CONNECTOR 1/2X3/8X1/2 3 WAY (MISCELLANEOUS) ×2
CONNECTOR 1/2X3/8X1/2 3WAY (MISCELLANEOUS) IMPLANT
CONT SPEC 4OZ CLIKSEAL STRL BL (MISCELLANEOUS) ×11 IMPLANT
DERMABOND ADVANCED (GAUZE/BANDAGES/DRESSINGS) ×2
DERMABOND ADVANCED .7 DNX12 (GAUZE/BANDAGES/DRESSINGS) IMPLANT
DRAPE LAPAROSCOPIC ABDOMINAL (DRAPES) ×4 IMPLANT
DRAPE SLUSH/WARMER DISC (DRAPES) ×2 IMPLANT
DRAPE WARM FLUID 44X44 (DRAPE) ×1 IMPLANT
ELECT BLADE 6.5 EXT (BLADE) ×3 IMPLANT
ELECT REM PT RETURN 9FT ADLT (ELECTROSURGICAL) ×4
ELECTRODE REM PT RTRN 9FT ADLT (ELECTROSURGICAL) ×2 IMPLANT
GAUZE SPONGE 4X4 12PLY STRL (GAUZE/BANDAGES/DRESSINGS) ×6 IMPLANT
GOWN STRL REUS W/ TWL LRG LVL3 (GOWN DISPOSABLE) ×6 IMPLANT
GOWN STRL REUS W/TWL LRG LVL3 (GOWN DISPOSABLE) ×12
KIT BASIN OR (CUSTOM PROCEDURE TRAY) ×4 IMPLANT
KIT ROOM TURNOVER OR (KITS) ×4 IMPLANT
KIT SUCTION CATH 14FR (SUCTIONS) ×4 IMPLANT
NS IRRIG 1000ML POUR BTL (IV SOLUTION) ×14 IMPLANT
PACK CHEST (CUSTOM PROCEDURE TRAY) ×4 IMPLANT
PAD ARMBOARD 7.5X6 YLW CONV (MISCELLANEOUS) ×14 IMPLANT
SEALANT SURG COSEAL 4ML (VASCULAR PRODUCTS) ×2 IMPLANT
SOLUTION ANTI FOG 6CC (MISCELLANEOUS) ×4 IMPLANT
SPONGE GAUZE 4X4 12PLY STER LF (GAUZE/BANDAGES/DRESSINGS) ×2 IMPLANT
SPONGE TONSIL 1.25 RF SGL STRG (GAUZE/BANDAGES/DRESSINGS) ×4 IMPLANT
SUT CHROMIC 3 0 SH 27 (SUTURE) IMPLANT
SUT CHROMIC 4 0 SH 27 (SUTURE) ×15 IMPLANT
SUT ETHILON 3 0 PS 1 (SUTURE) IMPLANT
SUT PROLENE 3 0 SH DA (SUTURE) IMPLANT
SUT PROLENE 4 0 RB 1 (SUTURE)
SUT PROLENE 4-0 RB1 .5 CRCL 36 (SUTURE) IMPLANT
SUT PROLENE 6 0 C 1 30 (SUTURE) IMPLANT
SUT SILK  1 MH (SUTURE) ×6
SUT SILK 1 MH (SUTURE) ×4 IMPLANT
SUT SILK 1 TIES 10X30 (SUTURE) IMPLANT
SUT SILK 2 0SH CR/8 30 (SUTURE) IMPLANT
SUT SILK 3 0SH CR/8 30 (SUTURE) IMPLANT
SUT VIC AB 1 CTX 18 (SUTURE) ×4 IMPLANT
SUT VIC AB 1 CTX 36 (SUTURE) ×4
SUT VIC AB 1 CTX36XBRD ANBCTR (SUTURE) ×1 IMPLANT
SUT VIC AB 2 TP1 27 (SUTURE) IMPLANT
SUT VIC AB 2-0 CT2 18 VCP726D (SUTURE) IMPLANT
SUT VIC AB 2-0 CTX 36 (SUTURE) ×2 IMPLANT
SUT VIC AB 3-0 SH 18 (SUTURE) IMPLANT
SUT VIC AB 3-0 X1 27 (SUTURE) ×4 IMPLANT
SUT VICRYL 0 UR6 27IN ABS (SUTURE) IMPLANT
SUT VICRYL 2 TP 1 (SUTURE) ×3 IMPLANT
SWAB COLLECTION DEVICE MRSA (MISCELLANEOUS) ×2 IMPLANT
SYRINGE 20CC LL (MISCELLANEOUS) ×2 IMPLANT
SYSTEM SAHARA CHEST DRAIN ATS (WOUND CARE) ×4 IMPLANT
TAPE CLOTH SURG 4X10 WHT LF (GAUZE/BANDAGES/DRESSINGS) ×4 IMPLANT
TIP APPLICATOR SPRAY EXTEND 16 (VASCULAR PRODUCTS) IMPLANT
TOWEL OR 17X24 6PK STRL BLUE (TOWEL DISPOSABLE) ×4 IMPLANT
TOWEL OR 17X26 10 PK STRL BLUE (TOWEL DISPOSABLE) ×5 IMPLANT
TRAP SPECIMEN MUCOUS 40CC (MISCELLANEOUS) ×9 IMPLANT
TRAY FOLEY CATH 16FRSI W/METER (SET/KITS/TRAYS/PACK) ×2 IMPLANT
TUBE ANAEROBIC SPECIMEN COL (MISCELLANEOUS) ×3 IMPLANT
WATER STERILE IRR 1000ML POUR (IV SOLUTION) ×11 IMPLANT

## 2015-11-26 NOTE — Anesthesia Postprocedure Evaluation (Signed)
Anesthesia Post Note  Patient: Kylie [name]  Procedure(s) Performed: Procedure(s) (LRB): VIDEO ASSISTED THORACOSCOPY (VATS)/EMPYEMA (Right) VIDEO BRONCHOSCOPY (N/A)  Patient location during evaluation: SICU Anesthesia Type: General Level of consciousness: sedated Pain management: pain level controlled Vital Signs Assessment: post-procedure vital signs reviewed and stable Respiratory status: patient remains intubated per anesthesia plan Cardiovascular status: stable Anesthetic complications: no    Last Vitals:  Filed Vitals:   11/26/15 0900 11/26/15 0950  BP: 96/64   Pulse: 95 96  Temp:    Resp: 24 28    Last Pain:  Filed Vitals:   11/26/15 1406  PainSc: 0-No pain                 Shanquita Ronning,JAMES TERRILL

## 2015-11-26 NOTE — Progress Notes (Signed)
Upon 0000 assessment, patient was obtunded with minimal response to sternal rub.  Rahul Desai PA called to bedside and orders for an ABG were received.  Not intubating but will continue to monitor patient.

## 2015-11-26 NOTE — Progress Notes (Signed)
The patient was examined and preop studies reviewed. There has been no change from the prior exam and the patient is ready for surgery.  plan bronchoscopy and Right VATS for drainage of empyema - effusion on [place]

## 2015-11-26 NOTE — Transfer of Care (Signed)
Immediate Anesthesia Transfer of Care Note  Patient: Kylie [name]  Procedure(s) Performed: Procedure(s): VIDEO ASSISTED THORACOSCOPY (VATS)/EMPYEMA (Right) VIDEO BRONCHOSCOPY (N/A)  Patient Location: ICU  Anesthesia Type:General  Level of Consciousness: sedated, unresponsive and Patient remains intubated per anesthesia plan  Airway & Oxygen Therapy: Patient remains intubated per anesthesia plan and Patient placed on Ventilator (see vital sign flow sheet for setting)  Post-op Assessment: Report given to RN and Post -op Vital signs reviewed and stable  Post vital signs: Reviewed and stable  Last Vitals:  Filed Vitals:   11/26/15 0900 11/26/15 0950  BP: 96/64   Pulse: 95 96  Temp:    Resp: 24 28    Last Pain:  Filed Vitals:   11/26/15 1212  PainSc: 0-No pain      Patients Stated Pain Goal: 0 ([id number])  Complications: No apparent anesthesia complications

## 2015-11-26 NOTE — Anesthesia Preprocedure Evaluation (Signed)
Anesthesia Evaluation  Patient identified by MRN, date of birth, ID band Patient awake    Reviewed: Allergy & Precautions, NPO status , Patient's Chart, lab work & pertinent test results  Airway Mallampati: II       Dental   Pulmonary shortness of breath, Current Smoker,  Loculated pneumonia and empyema   + rhonchi  + decreased breath sounds      Cardiovascular negative cardio ROS   Rhythm:Regular Rate:Tachycardia     Neuro/Psych    GI/Hepatic (+)     substance abuse  , Hepatitis -Polysubstance abuse   Endo/Other  Hypothyroidism Morbid obesity  Renal/GU      Musculoskeletal   Abdominal (+) + obese,   Peds  Hematology   Anesthesia Other Findings   Reproductive/Obstetrics                             Anesthesia Physical Anesthesia Plan  ASA: IV  Anesthesia Plan: General   Post-op Pain Management:    Induction: Intravenous  Airway Management Planned: Double Lumen EBT  Additional Equipment: Arterial line and CVP  Intra-op Plan:   Post-operative Plan: Possible Post-op intubation/ventilation  Informed Consent: I have reviewed the patients History and Physical, chart, labs and discussed the procedure including the risks, benefits and alternatives for the proposed anesthesia with the patient or authorized representative who has indicated his/her understanding and acceptance.     Plan Discussed with: CRNA and Surgeon  Anesthesia Plan Comments:         Anesthesia Quick Evaluation

## 2015-11-26 NOTE — Brief Op Note (Addendum)
11/23/2015 - 11/26/2015  1:13 PM  PATIENT:  Kylie [name]  45 y.o. female  PRE-OPERATIVE DIAGNOSIS:  1. LOCULATED RIGHT PLEURAL EFFUSION 2. RIGHT EMPYEMA  POST-OPERATIVE DIAGNOSIS:  1. LOCULATED RIGHT PLEURAL EFFUSION 2. RIGHT EMPYEMA  PROCEDURE:  VIDEO BRONCHOSCOPY, RIGHT VIDEO ASSISTED THORACOSCOPY (VATS), RIGHT THORACOTOMY for DRAINAGE of EMPYEMA and EFFUSION, and DECORTICATION  SURGEON:  Surgeon(s) and Role:    * Ivin Poot, MD - Primary  PHYSICIAN ASSISTANT: Lars Pinks PA-C  ANESTHESIA:   general  EBL:  Total I/O In: -  Out: 200 [Urine:200]  BLOOD ADMINISTERED:none  DRAINS: 2 36 French chest tubes and a 32 Blake drain placed in the right pleural space   SPECIMEN:  Source of Specimen:  Right pleural fluid and peel  DISPOSITION OF SPECIMEN:  Cytology, culture  COUNTS CORRECT:  YES  DICTATION: .Dragon Dictation  PLAN OF CARE: Admit to inpatient   PATIENT DISPOSITION:  ICU - intubated and hemodynamically stable.   Delay start of Pharmacological VTE agent (>24hrs) due to surgical blood loss or risk of bleeding: yes

## 2015-11-26 NOTE — Progress Notes (Signed)
Pharmacy Antibiotic Note  Kylie [name] is a 45 y.o. female admitted on 11/23/2015 with pneumonia and empyema.  Pharmacy has been consulted for vancomycin and zosyn dosing. Pt had been on azithromycin and ceftriaxone for suspected CAP.   Plan: Vancomycin 1g IV every 8 hours.  Goal trough 15-20 mcg/mL. Zosyn 3.375g IV q8h (4 hour infusion).  Monitor culture data, renal function and clinical course VT at SS prn  Height: 5\' 9"  (175.3 cm) Weight: 216 lb 6.4 oz (98.158 kg) IBW/kg (Calculated) : 66.2  Temp (24hrs), Avg:98.3 F (36.8 C), Min:97.8 F (36.6 C), Max:99 F (37.2 C)   Recent Labs Lab 11/23/15 1551 11/24/15 0439 11/25/15 1339 11/25/15 1824 11/26/15 0252  WBC 17.3* 16.6* 24.6* 24.2* 21.4*  CREATININE 0.79 0.65 0.72 0.73 0.69    Estimated Creatinine Clearance: 113.1 mL/min (by C-G formula based on Cr of 0.69).    Allergies  Allergen Reactions  . Pregabalin Anaphylaxis  . Hydrocodone Itching and Rash  . Citalopram     Worsened anxiety  . Ibuprofen     Stomach pain  . Naproxen Swelling    troat and fingers swelling   . Povidone-Iodine Itching and Rash    Antimicrobials this admission: CTX 6/24 >> 6/27 Azith 6/25 >> 6/27 Vanc 6/26 >> Zosyn 6/27 >>  Dose adjustments this admission: n/a  Microbiology results: 6/26 BCx2: sent  6/26 UCx: sent  Sputum:  6/26 MRSA PCR: neg   Andrey Cota. Diona Foley, PharmD, [place] Clinical Pharmacist Pager [phone]  11/26/2015 2:31 PM

## 2015-11-26 NOTE — Care Management Note (Signed)
Case Management Note  Patient Details  Name: Kylie [name] MRN: [id number] Date of Birth: 1971-03-14  Subjective/Objective:     S/p VATS - remains intubated               Action/Plan:  PTA independent from home with boyfriend.  Son and girlfriend at bedside.  CM will continue to monitor for disposition plans   Expected Discharge Date:                  Expected Discharge Plan:  [place]  In-House Referral:     Discharge planning Services  CM Consult  Post Acute Care Choice:    Choice offered to:     DME Arranged:    DME Agency:     HH Arranged:    HH Agency:     Status of Service:  In process, will continue to follow  If discussed at Long Length of Stay Meetings, dates discussed:    Additional Comments:  Maryclare Labrador, RN 11/26/2015, 3:26 PM

## 2015-11-26 NOTE — Progress Notes (Signed)
PULMONARY / CRITICAL CARE MEDICINE   Name: JOA MADDUX MRN: [id number] DOB: 04/24/1971    ADMISSION DATE:  11/23/2015 CONSULTATION DATE:  6/26  REFERRING MD:  Erin Hearing   CHIEF COMPLAINT:  Progressive respiratory failure and abnormal CXR   HISTORY OF PRESENT ILLNESS:   45 y.o. female admitted 6/24 with cc: progressive shortness of breath. Reported it had been present for the last month, coming and going. Over the last two weeks and especially the last couple of days, dyspnea has gotten worse, and patient has had dry cough. She has had associated right UQ pain and right lower chest pain, worsened by cough. Cough has also caused loss of bladder control. She reported having subjective fever "a couple of times" over the last month. She denies sick contacts. She has not been hospitalized or had antibiotics within the last 3 months. She has not tried any medicines for her symptoms. Some nausea but no v/d. She was admitted to the IM service. Treated w/ working dx of CAP. PCCM asked to see on 6/26 when pt found confused and more hypoxic w/ sats in 40s on room air.    SUBJECTIVE:  Feels short of breath   VITAL SIGNS: BP 96/64 mmHg  Pulse 96  Temp(Src) 99 F (37.2 C) (Oral)  Resp 28  Ht 5\' 9"  (1.753 m)  Wt 216 lb 6.4 oz (98.158 kg)  BMI 31.94 kg/m2  SpO2 87%  LMP 11/23/2015  HEMODYNAMICS:    VENTILATOR SETTINGS: Vent Mode:  [-]  FiO2 (%):  [100 %] 100 %  INTAKE / OUTPUT: I/O last 3 completed shifts: In: 2058.8 [P.O.:240; I.V.:888.8; Other:130; IV Piggyback:800] Out: 2910 [Urine:2910]  PHYSICAL EXAMINATION: General: sedated s/p VATs. Appears comfortable Neuro:  sedated, moves all ext. No focal def  HEENT:  NCAT, MM are moist. Orally intubated Cardiovascular:  RRR, no MRG  Lungs:  Diffuse rhonchi, right CT w/ mod amt serous fluid. Abdomen:  Soft, not currently tender  Musculoskeletal:  Equal st and bulk  Skin:  Warm and dry   LABS:  BMET  Recent Labs Lab 11/25/15 1339  11/25/15 1824 11/26/15 0252  NA 140 141 142  K 4.2 3.7 3.6  CL 104 103 103  CO2 25 30 30   BUN 5* <5* 6  CREATININE 0.72 0.73 0.69  GLUCOSE 114* 144* 120*    Electrolytes  Recent Labs Lab 11/25/15 1339 11/25/15 1824 11/26/15 0252  CALCIUM 8.4* 8.5* 8.6*    CBC  Recent Labs Lab 11/25/15 1339 11/25/15 1824 11/26/15 0252  WBC 24.6* 24.2* 21.4*  HGB 12.2 11.8* 10.9*  HCT 36.8 36.2 34.2*  PLT 360 337 349    Coag's  Recent Labs Lab 11/25/15 1824  APTT 26  INR 1.37    Sepsis Markers No results for input(s): LATICACIDVEN, PROCALCITON, O2SATVEN in the last 168 hours.  ABG  Recent Labs Lab 11/25/15 1143 11/25/15 1810 11/26/15 0055  PHART 7.347* 7.351 7.366  PCO2ART 47.9* 56.3* 58.2*  PO2ART 59.6* 115.0* 99.2    Liver Enzymes  Recent Labs Lab 11/23/15 1551 11/25/15 1824  AST 17 28  ALT 8* 19  ALKPHOS 94 119  BILITOT 0.4 <0.1*  ALBUMIN 3.3* 2.3*    Cardiac Enzymes  Recent Labs Lab 11/24/15 0439  TROPONINI <0.03    Glucose  Recent Labs Lab 11/25/15 1123 11/25/15 1312  GLUCAP 194* 145*    Imaging [place] Chest  11/25/2015  CLINICAL DATA:  Loculated right pleural effusion. Request evaluation for possible thoracentesis. EXAM: CHEST ULTRASOUND  COMPARISON:  CT chest. FINDINGS: Limited ultrasound of the right chest finds a densely loculated pleural effusion with thickened septations. There was very little appreciable free fluid and this was felt unsafe for percutaneous access. Thoracentesis not performed. IMPRESSION: Densely loculated/septated right pleural effusion. Read by: Ascencion Dike PA-C Electronically Signed   By: Sandi Mariscal M.D.   On: 11/25/2015 17:22   PCXR CT good position,  Diffuse bilateral airspace disease.   STUDIES:  CT chest 6/24: 1. Consolidated lung in the RIGHT lower lobe with differential including pneumonia versus neoplasm. 2. Peribronchial and perihilar thickening on the RIGHT consistent with infection or neoplasm. 3. Mild  mediastinal adenopathy differential including metastatic adenopathy versus reactive adenopathy 4. Partially loculated pleural fluid in the lower RIGHT thorax.  CULTURES: BCX2 6/26>>>  HIV 6/26>>>  U strep 6/26>>> Negative Urine Culture 6/26: neg   ANTIBIOTICS: Rocephin 6/24>>> azith 6/25>>> vanc 6/26>>>  SIGNIFICANT EVENTS: 6/26: Admitted to 45M for worsening hypoxia and altered ental status 6/27: OR for VATS   LINES/TUBES: 6/27 ETT >>> 6/27 right chest tube s/p vats>>>  DISCUSSION: Progressive acute hypoxic respiratory failure in setting of CAP w/ evolving ARDS and loculated effusion. She also has a component of what seems to be vocal cord dysfunction. Now intubated s/p VATs. Effusion much improved but still w/ diffuse bilateral airspace disease. Will cont abx, keep on vent over night and f/u am cxr. Hope we can start weaning efforts in am.   ASSESSMENT / PLAN:  PULMONARY A: Acute hypoxic respiratory failure in setting of CAP w/ associated acute lung injury/evolving ARDS Loculated right effusion  Upper airway wheezing/ VCD S/P VATS P:   Remains intubated post-op Scheduled BDs Dc solumedrol (upper airway wheeze) S/p VATS -->chest tube per CVTS ABG/CXR post op and in AM Chlorhexidine for VAP prevention DC benzonatate   CARDIOVASCULAR A:  SIRS/sepsis  P:  Pressors if needed to maintain MAP>60 Tele Keep even vol status  EKG daily for QTc evaluation (Methadone and Seroquel) See ID section   RENAL A:   No acute  P:   Monitor BMP Foley for accurate I/O Strict I&O Replace potassium and Magnesium as needed   GASTROINTESTINAL A:   abd pain-->resolved P:  NPO Nutrition consult for TF Pepcid for GI prophylaxis  HEMATOLOGIC A:   Mild anemia  P:  Restart LMWH when OK post-op CBC daily Transfuse per ICU protocol  DC Toradol post-op  INFECTIOUS A:   CAP w/ associated loculated right effusion H/o Hep B P:   Cont Azithromycin, Ceftriaxone,  Vancomycin F/u cultures  Leukocytosis improving Afebrile   ENDOCRINE A:   Hypothyroidism  P:   Continue levothyroxine 50 mcgs Check TSH FSBS Q 4 hours while on steroids SSI   NEUROLOGIC A:   Anxiety  H/o bipolar disease Chronic pain Acute encephalopathy   P:   RASS goal: -2  Fentanyl gtt for comfort Cont diprivan gtt  Continue Methadone 20 Q8 (home medication) Continue Seroquel 150 BID (home medication) DC Baclofen, Vistaril, Lithium   FAMILY  - Updates:   - Inter-disciplinary family meet or Palliative Care meeting due by:  [place] ACNP-BC [place] Pager # [phone] OR # [phone] if no answer   STAFF NOTE: I, Merrie Roof, MD FACP have personally reviewed patient's available data, including medical history, events of note, physical examination and test results as part of my evaluation. I have discussed with resident/NP and other care providers such as pharmacist, RN and RRT.  In addition, I personally evaluated patient and elicited key findings of: post op, ronchi, Chest tube, lung much improved after vats but now with ards worsening infiltrates contralateral lung even from empyema, chesttubes per cvts, abx , follow cultures, get stat abg, pcxr, keep plat less 30 , in am to lasix and neg balance likely, dc methadone on vent, fent drip needed, prop, wua in am, reluctant to use protocol ards with high peeps that can be used, will manage The patient is critically ill with multiple organ systems failure and requires high complexity decision making for assessment and support, frequent evaluation and titration of therapies, application of advanced monitoring technologies and extensive interpretation of multiple databases.   Critical Care Time devoted to patient care services described in this note is 25 Minutes. This time reflects time of care of this signee: Merrie Roof, MD FACP. This critical care time does not reflect  procedure time, or teaching time or supervisory time of PA/NP/Med student/Med Resident etc but could involve care discussion time. Rest per NP/medical resident whose note is outlined above and that I agree with   Lavon Paganini. Titus Mould, MD, [place] Pgr: [facility] 11/26/2015 4:40 PM

## 2015-11-26 NOTE — Progress Notes (Signed)
Family Medicine Teaching Service Daily Progress Note Intern Pager: [phone]  Patient name: Kylie [name] Medical record number: [id number] Date of birth: [birth year] Age: 45 y.o. Gender: female  Primary Care Provider: Zigmund Gottron, MD Consultants: None Code Status: Full  Pt Overview and Major Events to Date:  6/25: Admitted to FMTS with RLL pneumonia and hypoxia with new O2 requirement  Assessment and Plan: Kylie [name] is a 45 y.o. female presenting with SOB, cough, and right-sided lower chest and upper abdominal pain. PMH is significant for bipolar disorder, chronic pain, drug abuse, tobacco abuse, and hepatitis B.   Acute hypoxic respiratory failure with right sided empyema: 2/2 RLL pneumonia vs malignancy. Became obtunded overnight, ABG performed and showed worsening hypercarbia with CO2 47.9 > 58.2. Afebrile overnight. Currently on 15L non-rebreather. Breathing comfortably on exam this morning. Diffuse wheezing with bibasilar crackles. WBC 16.6 > 24.6 > 21.4.  - Pulm consulted. Broadened antibiotics to Vanc/CTX/Azithromycin. Change to IV Solumedrol. Check urinary strep antigen which was negative. Did not tolerate BiPAP, will need to intubate if worsens. - CVTS consulted. Plan for VATS today. - Dr. Andria Frames (PCP) recommending BNP to rule out cocaine-induced cardiomyopathy, given her bibasilar crackles, which was 176.2.  - ECHO performed, read is pending. - Scheduled duonebs q6hrs - Blood cultures drawn 6/26 (after antibiotics)- pending  Mediastinal Lymphadenopathy: Seen on CTA chest. Reactive adenopathy in the setting of CAP vs metastatic lymphadenopathy. +Fhx of breast cancer in mother. - Will consider cancer work up if symptoms do not improve with antibiotics. - HIV pending  Atypical chest pain, resolved: Most likely chest wall pain in setting of pneumonia; worsened with cough. Also anxiety may be contributory. EKG with diffuse T wave flattening, otherwise unremarkable.  Troponin negative x 1. - Continue to monitor. - Scheduled Tylenol and Toradol for pain  Bipolar I disorder: Lithium level on admission: 0.70 (L). - Continue home lithium 300 mg BID, hydroxyzine 25 mg TID, quetiapine 300 mg QHS  Hypothyroidism: TSH 7.61 on 10/02/15 - Continue home levothyroxine 50 mcg daily - Repeat TSH in 1 month  Hx of abnormal Pap Smear: Had LEEP 07/2010 after high grade lesion seen on colposcopy, performed after abnormal pap smear. She has had negative pap smears since. 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  - Repeat pap smear in 1 year  Chronic pain: Patient reports she takes 120 mg methadone daily from the clinic on [facility]. 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  - Continue home baclofen 10 mg TID, gabapentin 300 mg TID - Continue methadone 120 mg daily  Tobacco abuse: Smokes 1 ppd. - Nicotine patch 21 mg daily, per patient preference  Hepatitis B, resolved: Surface Antigen still positive 01/26/2008 with negative Hep B S Ab. No transamininitis on admission with AST 17 and ALT 8.  - Hep B surface antigen and surface antibody negative  FEN/GI: NPO for procedure today Prophylaxis: lovenox  Disposition: Continued inpatient management required.  Subjective:  Pt states she is doing better this morning. Her breathing has improved. She feels like she can breathe comfortably on the non-rebreather. Her right sided chest pain has resolved.  Objective: Temp:  [97.8 F (36.6 C)-99 F (37.2 C)] 99 F (37.2 C) (06/27 0844) Pulse Rate:  [70-117] 90 (06/27 0828) Resp:  [10-23] 20 (06/27 0828) BP: (70-127)/(54-110) 97/62 mmHg (06/27 0500) SpO2:  [91 %-100 %] 96 % (06/27 0828) FiO2 (%):  [100 %]  100 % (06/26 2003) Physical Exam: General: Sitting up in bed, non-rebreather in place, in NAD, answers questions. HEENT: [place]/AT, EOMI, MMM Neck: Supple, no cervical lymphadenopathy   Cardiovascular: RRR, S1, S2, no m/r/g Respiratory: Comfortable work of breathing, diffuse wheezing in all lung fields, bibasilar crackles. Abdomen: +BS, soft, NT, ND, no rebound or guarding MSK: Moves all extremities spontaneously, no LE edema Skin: No rashes or lesions appreciated Neuro: AOx3, no focal deficits Psych: Appropriate mood and affect  Laboratory:  Recent Labs Lab 11/25/15 1339 11/25/15 1824 11/26/15 0252  WBC 24.6* 24.2* 21.4*  HGB 12.2 11.8* 10.9*  HCT 36.8 36.2 34.2*  PLT 360 337 349    Recent Labs Lab 11/23/15 1551  11/25/15 1339 11/25/15 1824 11/26/15 0252  NA 135  < > 140 141 142  K 4.1  < > 4.2 3.7 3.6  CL 99*  < > 104 103 103  CO2 26  < > 25 30 30   BUN 6  < > 5* <5* 6  CREATININE 0.79  < > 0.72 0.73 0.69  CALCIUM 8.7*  < > 8.4* 8.5* 8.6*  PROT 7.6  --   --  6.5  --   BILITOT 0.4  --   --  <0.1*  --   ALKPHOS 94  --   --  119  --   ALT 8*  --   --  19  --   AST 17  --   --  28  --   GLUCOSE 165*  < > 114* 144* 120*  < > = values in this interval not displayed. UDS: positive for opiates, benzos, and THC Troponin I: <0.03 Lipase: <10 UA: small Hgb, small bili, 15 ketones, 30 protein, negative leukocytes, negative nitrites Lithium: 0.70 (L)  ABG (6/26): pH 7.34, pCO2 47.9, pO2 59.6, bicarb 25 ABG (6/27): pH 7.36, pCO2 58.2, pO2 99.2, bicarb 32.5  Imaging/Diagnostic Tests: CTA Chest: large consolidation of RLL with peribronchial and perihilar thickening and paritally loculated fluid. Moderate mediastinal lymphadenopathy concerning for reactive adenopathy in setting of infection vs. metastatic lymphadenopathy.  Sela Hua, MD 11/26/2015, 9:00 AM PGY-1, [place] Intern pager: [phone], text pages welcome

## 2015-11-26 NOTE — Progress Notes (Signed)
Patient arrived to short stay with ring.  Ring removed by short stay nurse.  Nurse from [place] was notified that the ring needed to be picked up but nurse stated that no one was available to pick up ring at this time.  Short stay nurse walked ring to [facility] [place] and ring was given to Network engineer.  Patient made aware.

## 2015-11-26 NOTE — Anesthesia Procedure Notes (Addendum)
Procedure Name: Intubation Date/Time: 11/26/2015 10:31 AM Performed by: Rebekah Chesterfield L Pre-anesthesia Checklist: Patient identified, Emergency Drugs available, Suction available and Patient being monitored Patient Re-evaluated:Patient Re-evaluated prior to inductionOxygen Delivery Method: Circle System Utilized Preoxygenation: Pre-oxygenation with 100% oxygen Intubation Type: IV induction Ventilation: Mask ventilation without difficulty Laryngoscope Size: Mac and 3 Grade View: Grade I Tube type: Oral Number of attempts: 1 Airway Equipment and Method: Stylet Placement Confirmation: ETT inserted through vocal cords under direct vision,  positive ETCO2 and breath sounds checked- equal and bilateral Secured at: 22 cm Tube secured with: Tape Dental Injury: Teeth and Oropharynx as per pre-operative assessment     Date/Time: 11/26/2015 11:20 AM Performed by: Greggory Stallion, Kandise Riehle L Laryngoscope Size: Mac and 3 Grade View: Grade I Endobronchial tube: Left, Double lumen EBT, EBT position confirmed by auscultation and EBT position confirmed by fiberoptic bronchoscope and 37 Fr Number of attempts: 1 Airway Equipment and Method: Stylet Placement Confirmation: ETT inserted through vocal cords under direct vision,  positive ETCO2 and breath sounds checked- equal and bilateral Secured at: 27 cm Tube secured with: Tape Dental Injury: Teeth and Oropharynx as per pre-operative assessment  Comments: Tube exchange under DV     Procedure Name: Intubation Date/Time: 11/26/2015 1:26 PM Performed by: Greggory Stallion, Mekel Haverstock L Pre-anesthesia Checklist: Patient identified, Emergency Drugs available, Suction available and Patient being monitored Patient Re-evaluated:Patient Re-evaluated prior to inductionOxygen Delivery Method: Circle System Utilized Preoxygenation: Pre-oxygenation with 100% oxygen Laryngoscope Size: Mac and 3 Grade View: Grade I Tube type: Subglottic suction tube Tube size: 7.5 mm Number  of attempts: 1 Airway Equipment and Method: Stylet and Oral airway Placement Confirmation: ETT inserted through vocal cords under direct vision,  positive ETCO2 and breath sounds checked- equal and bilateral Secured at: 21 cm Tube secured with: Tape Dental Injury: Teeth and Oropharynx as per pre-operative assessment

## 2015-11-27 ENCOUNTER — Inpatient Hospital Stay (HOSPITAL_COMMUNITY): Payer: Medicaid Other

## 2015-11-27 ENCOUNTER — Encounter (HOSPITAL_COMMUNITY): Payer: Self-pay | Admitting: Cardiothoracic Surgery

## 2015-11-27 LAB — BASIC METABOLIC PANEL
Anion gap: 11 (ref 5–15)
BUN: 8 mg/dL (ref 6–20)
CALCIUM: 7.6 mg/dL — AB (ref 8.9–10.3)
CO2: 27 mmol/L (ref 22–32)
CREATININE: 0.65 mg/dL (ref 0.44–1.00)
Chloride: 101 mmol/L (ref 101–111)
Glucose, Bld: 147 mg/dL — ABNORMAL HIGH (ref 65–99)
Potassium: 4 mmol/L (ref 3.5–5.1)
SODIUM: 139 mmol/L (ref 135–145)

## 2015-11-27 LAB — BLOOD GAS, ARTERIAL
Acid-Base Excess: 6.7 mmol/L — ABNORMAL HIGH (ref 0.0–2.0)
Bicarbonate: 31.3 mEq/L — ABNORMAL HIGH (ref 20.0–24.0)
Drawn by: 419771
FIO2: 0.5
MECHVT: 530 mL
O2 Saturation: 97.6 %
PEEP: 5 cmH2O
Patient temperature: 98.6
RATE: 18 resp/min
TCO2: 32.8 mmol/L (ref 0–100)
pCO2 arterial: 49.9 mmHg — ABNORMAL HIGH (ref 35.0–45.0)
pH, Arterial: 7.414 (ref 7.350–7.450)
pO2, Arterial: 101 mmHg — ABNORMAL HIGH (ref 80.0–100.0)

## 2015-11-27 LAB — TSH: TSH: 2.192 u[IU]/mL (ref 0.350–4.500)

## 2015-11-27 LAB — GLUCOSE, CAPILLARY
GLUCOSE-CAPILLARY: 107 mg/dL — AB (ref 65–99)
GLUCOSE-CAPILLARY: 137 mg/dL — AB (ref 65–99)
GLUCOSE-CAPILLARY: 153 mg/dL — AB (ref 65–99)
Glucose-Capillary: 108 mg/dL — ABNORMAL HIGH (ref 65–99)
Glucose-Capillary: 109 mg/dL — ABNORMAL HIGH (ref 65–99)
Glucose-Capillary: 132 mg/dL — ABNORMAL HIGH (ref 65–99)
Glucose-Capillary: 159 mg/dL — ABNORMAL HIGH (ref 65–99)
Glucose-Capillary: 219 mg/dL — ABNORMAL HIGH (ref 65–99)

## 2015-11-27 LAB — CBC
HCT: 31.6 % — ABNORMAL LOW (ref 36.0–46.0)
Hemoglobin: 9.8 g/dL — ABNORMAL LOW (ref 12.0–15.0)
MCH: 27.6 pg (ref 26.0–34.0)
MCHC: 31 g/dL (ref 30.0–36.0)
MCV: 89 fL (ref 78.0–100.0)
Platelets: 331 10*3/uL (ref 150–400)
RBC: 3.55 MIL/uL — ABNORMAL LOW (ref 3.87–5.11)
RDW: 14.1 % (ref 11.5–15.5)
WBC: 15.9 10*3/uL — ABNORMAL HIGH (ref 4.0–10.5)

## 2015-11-27 LAB — ACID FAST SMEAR (AFB, MYCOBACTERIA): Acid Fast Smear: NEGATIVE

## 2015-11-27 LAB — PHOSPHORUS: Phosphorus: 2.5 mg/dL (ref 2.5–4.6)

## 2015-11-27 LAB — VANCOMYCIN, TROUGH: VANCOMYCIN TR: 12 ug/mL — AB (ref 15–20)

## 2015-11-27 LAB — MAGNESIUM: MAGNESIUM: 2.2 mg/dL (ref 1.7–2.4)

## 2015-11-27 MED ORDER — FENTANYL BOLUS VIA INFUSION
25.0000 ug | INTRAVENOUS | Status: DC | PRN
  Administered 2015-11-27 – 2015-12-01 (×3): 50 ug via INTRAVENOUS
  Filled 2015-11-27: qty 50

## 2015-11-27 MED ORDER — GABAPENTIN 300 MG PO CAPS: 300.0000 mg | ORAL_CAPSULE | Freq: Three times a day (TID) | ORAL | Status: DC

## 2015-11-27 MED ORDER — POTASSIUM CHLORIDE 10 MEQ/50ML IV SOLN
10.0000 meq | INTRAVENOUS | Status: AC
  Administered 2015-11-27 (×2): 10 meq via INTRAVENOUS
  Filled 2015-11-27 (×2): qty 50

## 2015-11-27 MED ORDER — GABAPENTIN 250 MG/5ML PO SOLN
300.0000 mg | Freq: Three times a day (TID) | ORAL | Status: DC
Start: 2015-11-27 — End: 2015-12-02
  Administered 2015-11-27 – 2015-12-01 (×14): 300 mg
  Filled 2015-11-27 (×17): qty 6

## 2015-11-27 MED ORDER — FUROSEMIDE 10 MG/ML IJ SOLN
20.0000 mg | Freq: Every day | INTRAMUSCULAR | Status: DC
  Administered 2015-11-27 – 2015-12-02 (×6): 20 mg via INTRAVENOUS
  Filled 2015-11-27 (×7): qty 2

## 2015-11-27 MED ORDER — BACLOFEN 1 MG/ML ORAL SUSPENSION
5.0000 mg | Freq: Three times a day (TID) | ORAL | Status: DC
Start: 2015-11-27 — End: 2015-12-02
  Administered 2015-11-27 – 2015-12-01 (×14): 5 mg
  Filled 2015-11-27 (×16): qty 0.5

## 2015-11-27 MED ORDER — MIDAZOLAM HCL 2 MG/2ML IJ SOLN: 1.0000 mg | INTRAMUSCULAR | Status: DC | PRN

## 2015-11-27 NOTE — Progress Notes (Signed)
Pharmacy Antibiotic Note  Kylie [name] is a 45 y.o. female admitted on 11/23/2015 with pneumonia and empyema.  Pharmacy has been consulted for vancomycin and zosyn dosing. Pt had been on azithromycin and ceftriaxone for suspected CAP. Pt is afebrile and WBC is elevated at 15.9 but is trending down. Cultures are negative so far. A vancomycin trough was drawn today and is low at 12 but it was drawn late.   Plan: - Continue vancomycin 1gm IV Q8H - may accumulate some and hopefully can de-escalate, would recheck a trough in a few days if continued - Continue zosyn 3.375gm IV Q8H (4 hr inf) - F/u renal fxn, C&S, clinical status and trough at SS  Height: 5\' 9"  (175.3 cm) Weight: 214 lb 1.1 oz (97.1 kg) IBW/kg (Calculated) : 66.2  Temp (24hrs), Avg:98.8 F (37.1 C), Min:98.5 F (36.9 C), Max:99.3 F (37.4 C)   Recent Labs Lab 11/24/15 0439 11/25/15 1339 11/25/15 1824 11/26/15 0252 11/27/15 0320 11/27/15 1420  WBC 16.6* 24.6* 24.2* 21.4* 15.9*  --   CREATININE 0.65 0.72 0.73 0.69 0.65  --   VANCOTROUGH  --   --   --   --   --  12*    Estimated Creatinine Clearance: 112.5 mL/min (by C-G formula based on Cr of 0.65).    Allergies  Allergen Reactions  . Pregabalin Anaphylaxis  . Hydrocodone Itching and Rash  . Citalopram     Worsened anxiety  . Ibuprofen     Stomach pain  . Naproxen Swelling    troat and fingers swelling   . Povidone-Iodine Itching and Rash    Antimicrobials this admission: CTX 6/24 >> 6/27 Azith 6/25 >> 6/27 Vanc 6/26 >> Zosyn 6/27 >>  Dose adjustments this admission: 6/28 VT = 12 (drawn 1 hr late) on 1gm IV Q8H  Microbiology results: 6/26 BCx2: NGTD 6/26 UCx: NEG 6/26 Pleural fluid - NGTD 6/26 MRSA PCR: neg  Salome Arnt, PharmD, BCPS Pager # [phone] 11/27/2015 2:54 PM

## 2015-11-27 NOTE — Progress Notes (Signed)
PULMONARY / CRITICAL CARE MEDICINE   Name: Kylie [name] MRN: [id number] DOB: 1970/11/04    ADMISSION DATE:  11/23/2015 CONSULTATION DATE:  6/26  REFERRING MD:  Erin Hearing / FMTS  CHIEF COMPLAINT:  Progressive respiratory failure and abnormal CXR   HISTORY OF PRESENT ILLNESS:   45 y.o. female admitted 6/24 with cc: progressive shortness of breath. Reported it had been present for the last month, coming and going. Over the last two weeks and especially the last couple of days, dyspnea has gotten worse, and patient has had dry cough. She has had associated right UQ pain and right lower chest pain, worsened by cough. Cough has also caused loss of bladder control. She reported having subjective fever "a couple of times" over the last month. She denies sick contacts. She has not been hospitalized or had antibiotics within the last 3 months. She has not tried any medicines for her symptoms. Some nausea but no v/d. She was admitted to the IM service. Treated w/ working dx of CAP. PCCM asked to see on 6/26 when pt found confused and more hypoxic w/ sats in 40s on room air.   SUBJECTIVE: No acute events overnight.  REVIEW OF SYSTEMS:  Unable to obtain with intubation.  VITAL SIGNS: BP 110/62 mmHg  Pulse 95  Temp(Src) 98.8 F (37.1 C) (Oral)  Resp 20  Ht 5\' 9"  (1.753 m)  Wt 97.1 kg (214 lb 1.1 oz)  BMI 31.60 kg/m2  SpO2 94%  LMP 11/23/2015  HEMODYNAMICS:    VENTILATOR SETTINGS: Vent Mode:  [-] PRVC FiO2 (%):  [40 %-100 %] 40 % Set Rate:  [18 bmp] 18 bmp Vt Set:  [530 mL] 530 mL PEEP:  [5 cmH20] 5 cmH20 Plateau Pressure:  [26 cmH20-30 cmH20] 30 cmH20  INTAKE / OUTPUT: I/O last 3 completed shifts: In: 3841.7 [I.V.:2941.7; Other:100; IV Piggyback:800] Out: [id number] [Urine:2110; Emesis/NG output:410; Other:1000; Blood:125; Chest Tube:220]  PHYSICAL EXAMINATION: General: Sedated. No distress.  Neuro:  Opens eyes spontaneously. Sedated. Pupils symmetric. HEENT:  Moist mucous membranes.  No scleral injection or icterus. Endotracheal tube in place. Cardiovascular:  Regular rate. No edema or JVD. Lungs:  Coarse breath sounds bilaterally. Coarse wheeze on the left primarily. Symmetric chest wall rise on ventilator.  Musculoskeletal:   normal bulk. No joint deformity or effusion.  Skin:  Warm and dry. No rash on exposed skin.   LABS:  BMET  Recent Labs Lab 11/25/15 1824 11/26/15 0252 11/27/15 0320  NA 141 142 139  K 3.7 3.6 4.0  CL 103 103 101  CO2 30 30 27   BUN <5* 6 8  CREATININE 0.73 0.69 0.65  GLUCOSE 144* 120* 147*    Electrolytes  Recent Labs Lab 11/25/15 1824 11/26/15 0252 11/27/15 0320  CALCIUM 8.5* 8.6* 7.6*  MG  --   --  2.2  PHOS  --   --  2.5    CBC  Recent Labs Lab 11/25/15 1824 11/26/15 0252 11/27/15 0320  WBC 24.2* 21.4* 15.9*  HGB 11.8* 10.9* 9.8*  HCT 36.2 34.2* 31.6*  PLT 337 349 331    Coag's  Recent Labs Lab 11/25/15 1824  APTT 26  INR 1.37    Sepsis Markers No results for input(s): LATICACIDVEN, PROCALCITON, O2SATVEN in the last 168 hours.  ABG  Recent Labs Lab 11/26/15 0055 11/26/15 1448 11/27/15 0445  PHART 7.366 7.383 7.414  PCO2ART 58.2* 55.9* 49.9*  PO2ART 99.2 151.0* 101*    Liver Enzymes  Recent Labs Lab 11/23/15 1551 11/25/15  1824  AST 17 28  ALT 8* 19  ALKPHOS 94 119  BILITOT 0.4 <0.1*  ALBUMIN 3.3* 2.3*    Cardiac Enzymes  Recent Labs Lab 11/24/15 0439  TROPONINI <0.03    Glucose  Recent Labs Lab 11/26/15 1947 11/26/15 2347 11/27/15 0355 11/27/15 0543 11/27/15 0748 11/27/15 1124  GLUCAP 169* 219* 137* 109* 107* 108*    Imaging Dg Chest Port 1 View  11/27/2015  CLINICAL DATA:  Acute respiratory failure EXAM: PORTABLE CHEST 1 VIEW COMPARISON:  11/26/2015 FINDINGS: Endotracheal tube, nasogastric catheter and right jugular catheter are again seen and stable. Three right chest tubes are again identified and stable. No pneumothorax is seen. Patchy airspace disease is  again noted bilaterally but slightly improved from the prior exam. IMPRESSION: Mildly improved aeration bilaterally. Tubes and lines as described. No pneumothorax is seen. Electronically Signed   By: Inez Catalina M.D.   On: 11/27/2015 07:08   Dg Chest Port 1 View  11/26/2015  CLINICAL DATA:  Lung empyema. Status post VATS. Rule out pneumothorax. These results will be called to the ordering clinician or representative by the Radiology Department at the imaging location. EXAM: PORTABLE CHEST 1 VIEW COMPARISON:  One day prior FINDINGS: Endotracheal tube tip 9 mm above the carina. Orogastric tube with tip at the stomach and side port just below the GE junction. Right IJ central line with tip near the upper SVC. Three right-sided chest tubes, 2 apical and 1 basilar. Trace right apical pneumothorax. No visible residual pleural fluid. Bilateral airspace disease, asymmetric to the left, improved from chest x-ray yesterday. Request made to call about presence of pneumothorax. These results were called by telephone at the time of interpretation on 11/26/2015 at 1:52 pm to OR 17 circulator, who verbally acknowledged these results. IMPRESSION: 1. Endotracheal tube with tip 9 mm above the carina. Other tubes and central line are unremarkable. 2. Trace right apical pneumothorax.  No residual pleural fluid. 3. Improved aeration compared yesterday but still extensive airspace disease. Electronically Signed   By: Monte Fantasia M.D.   On: 11/26/2015 13:54   Dg Abd Portable 1v  11/26/2015  CLINICAL DATA:  NG tube placement EXAM: PORTABLE ABDOMEN - 1 VIEW COMPARISON:  None FINDINGS: NG tube is in place with the tip in the proximal to mid stomach. Side port near the GE junction. Nonobstructive bowel gas pattern. IMPRESSION: NG tube tip in the proximal to mid stomach. Electronically Signed   By: Rolm Baptise M.D.   On: 11/26/2015 16:44    STUDIES:  CT Chest 6/24: 1. Consolidated lung in the RIGHT lower lobe with differential  including pneumonia versus neoplasm. 2. Peribronchial and perihilar thickening on the RIGHT consistent with infection or neoplasm. 3. Mild mediastinal adenopathy differential including metastatic adenopathy versus reactive adenopathy 4. Partially loculated pleural fluid in the lower RIGHT thorax. Abd X-ray 6/27:  NGT w/ tip in proximal to mid stomach. Port CXR 6/28: Endotracheal tube approximately 4.8 cm above carina. Bilateral opacities improving. Right chest tube in good position. Right internal jugular central venous catheter in good position. Enteric feeding tube coursing below diaphragm with side-port below what appears to be esophageal sphincter.  MICROBIOLOGY: MRSA PCR 6/27 & 6/26:  Negative  Urine Ctx 6/26:  Negative Urine Strep Ag 6/26:  Negative Blood Ctx x2 6/26>>>  HIV 6/26:  Nonreactive  R Pleural Fluid 6/27>>> R Pleural Anaerobic Ctx 6/27>>> R Pleural Peel Routine, AFB & Fungal Ctx 6/27>>>  ANTIBIOTICS: Azithromycin 6/24 - 6/27 Rocephin 6/24 -  6/27 Cefuroxime 6/27 (surgical ppx) Vancomycin 6/26>>> Zosyn 6/27>>>  SIGNIFICANT EVENTS: 6/26 - Admitted to 101M for worsening hypoxia and altered ental status 6/27 - OR for R VATS  LINES/TUBES: L RAD ART LINE 6/27 - 6/28 OETT 7.5 6/27>>> R Y CHEST TUBE S/P VATS 6/27>>> FOLEY 6/27>>> OGT 6/27>>> R IJ CVL 6/27>>> PIV X3  ASSESSMENT / PLAN:  PULMONARY A: Acute Hypoxic Respiratory Failure ARDS Severe CAP Right Complicated Parapneumonic Effusion - S/P VATS 6/27. CT in place. H/O VCD  P:   Full Vent Support Daily SBT Chest Tube mgt per CVTS Lasix IV daily Duoneb q6hr  CARDIOVASCULAR A:  No acute issues.  P:  Monitor on telemetry Vitals per unit protocol EKG tomorrow to monitor QTc on Methadone & Seroquel D/C Arterial Line  RENAL A:   No acute issues.  P:   Trending UOP with Foley Monitoring electrolytes & renal function daily  Replacing electrolytes as indicated  GASTROINTESTINAL A:    Nutrition Abdominal Pain - Resolved. Likely due to CAP.  P:  NPO Tube Feedings per nutrition Pepcid IV q12hr  HEMATOLOGIC A:   Anemia - Mild & slightly worse. No signs of active bleeding. Leukocytosis - Improving. Secondary to sepsis.  P:  Trending cell counts daily w/ CBC Transfuse for Hgb <7.0 or active bleeding SCDs Holding on chemical DVT ppx for now  INFECTIOUS A:   Severe CAP w/ Complicated Parapneumonic Effusion H/O Hepatitis B Infection  P:   Empiric Vancomycin & Zosyn Day #2 Awaiting finalization of cultures  ENDOCRINE A:   H/O Hypothyroidism   P:   Synthroid VT daily Accu-Checks q4hr w/ notification parameters for MD D/C SSI & Accu-Checks q6hr  NEUROLOGIC A:   Sedation on Ventilator H/O Anxiety H/o Bipolar Disease Chronic Pain  P:   RASS goal: 0 to -1 Fentanyl gtt & IV prn Propofol gtt  Versed IV prn Seroquel 150mg  q12hr Neurontin 300mg  VT q8hr (home dose 300mg  tid) Continue Methadone 20 Q8 - Will attempt to verify home dose in AM.  Continue Seroquel 150 BID (home dose XR 300mg ) Restart Baclofen 5mg  VT q8hr (home dose 10mg  tid) Hold Vistaril & Lithium  FAMILY  - Updates: No family at bedside 6/28.  - Inter-disciplinary family meet or Palliative Care meeting due by:  7/2  TODAY'S SUMMARY:  45 year old female acute hypoxic respiratory failure from severe community acquired pneumonia and complicated right parapneumonic effusion. Now status post VATS with chest tube placement. Improving respiratory failure. Continuing patient on current antibiotic regimen while awaiting culture results. Attempting to establish patient's home dose of methadone. Continuing to monitor QTc daily with EKG.  I have spent a total of 39 minutes of critical care time today caring for the patient and reviewing the patient's electronic medical record.  Sonia Baller Ashok Cordia, M.D. [facility] Pager:  [phone] After 3pm or if no response, call  [phone] 11/27/2015 12:51 PM

## 2015-11-27 NOTE — Progress Notes (Addendum)
T[facility] DAILY ICU PROGRESS NOTE                   [address]            [place] [address]          [phone]   1 Day Post-Op Procedure(s) (LRB): VIDEO ASSISTED THORACOSCOPY (VATS)/EMPYEMA (Right) VIDEO BRONCHOSCOPY (N/A)  Total Length of Stay:  LOS: 4 days   Subjective: Intubated, alert , answers simple yes/no questions and follows simple commands  Objective: Vital signs in last 24 hours: Temp:  [98.5 F (36.9 C)-99.3 F (37.4 C)] 98.5 F (36.9 C) (06/28 0750) Pulse Rate:  [81-96] 88 (06/28 0729) Cardiac Rhythm:  [-] Normal sinus rhythm (06/28 0400) Resp:  [18-30] 18 (06/28 0729) BP: (96-128)/(57-90) 109/58 mmHg (06/28 0729) SpO2:  [87 %-100 %] 94 % (06/28 0729) Arterial Line BP: (100-152)/(44-87) 152/87 mmHg (06/28 0600) FiO2 (%):  [50 %-100 %] 50 % (06/28 0729) Weight:  [214 lb 1.1 oz (97.1 kg)] 214 lb 1.1 oz (97.1 kg) (06/28 0100)  Filed Weights   11/24/15 0310 11/24/15 2213 11/27/15 0100  Weight: 210 lb 4.8 oz (95.391 kg) 216 lb 6.4 oz (98.158 kg) 214 lb 1.1 oz (97.1 kg)    Weight change:    Hemodynamic parameters for last 24 hours:    Intake/Output from previous day: 06/27 0701 - 06/28 0700 In: 3491.7 [I.V.:2941.7; IV Piggyback:550] Out: 3532 [Urine:1775; Emesis/NG output:410; Blood:125; Chest Tube:220]  Vent Mode:  [-] PRVC FiO2 (%):  [50 %-100 %] 50 % Set Rate:  [18 bmp] 18 bmp Vt Set:  [530 mL] 530 mL PEEP:  [5 cmH20] 5 cmH20 Plateau Pressure:  [26 cmH20-28 cmH20] 28 cmH20   ABG    Component Value Date/Time   PHART 7.414 11/27/2015 0445   PCO2ART 49.9* 11/27/2015 0445   PO2ART 101* 11/27/2015 0445   HCO3 31.3* 11/27/2015 0445   TCO2 32.8 11/27/2015 0445   ACIDBASEDEF 4.0* 08/26/2012 1613   O2SAT 97.6 11/27/2015 0445     Intake/Output this shift:    Current Meds: Scheduled Meds: . acetaminophen  1,000 mg Oral Q6H   Or  . acetaminophen (TYLENOL) oral liquid 160 mg/5 mL  1,000 mg Oral Q6H  . antiseptic oral rinse  7  mL Mouth Rinse QID  . bisacodyl  10 mg Oral Daily  . chlorhexidine gluconate (SAGE KIT)  15 mL Mouth Rinse BID  . famotidine (PEPCID) IV  20 mg Intravenous Q12H  . gabapentin  300 mg Oral TID  . insulin aspart  0-24 Units Subcutaneous Q6H  . ipratropium-albuterol  3 mL Nebulization Q6H  . levothyroxine  50 mcg Oral QAC breakfast  . piperacillin-tazobactam (ZOSYN)  IV  3.375 g Intravenous Q8H  . QUEtiapine  150 mg Per Tube Q12H  . senna-docusate  1 tablet Oral QHS  . vancomycin  1,000 mg Intravenous Q8H   Continuous Infusions: . dextrose 5 % and 0.45 % NaCl with KCl 20 mEq/L 100 mL/hr at 11/27/15 0203  . fentaNYL infusion INTRAVENOUS 300 mcg/hr (11/27/15 0641)  . propofol (DIPRIVAN) infusion 30 mcg/kg/min (11/27/15 0627)   PRN Meds:.fentaNYL (SUBLIMAZE) injection, midazolam, ondansetron (ZOFRAN) IV, potassium chloride, traMADol  General appearance: alert, cooperative and no distress Heart: regular rate and rhythm Lungs: dim right R>L base Abdomen: benign Extremities: PAS in place , no edema Wound: dressings dry /intact  Lab Results: CBC: Recent Labs  11/26/15 0252 11/27/15 0320  WBC 21.4* 15.9*  HGB 10.9* 9.8*  HCT  34.2* 31.6*  PLT 349 331   BMET:  Recent Labs  11/26/15 0252 11/27/15 0320  NA 142 139  K 3.6 4.0  CL 103 101  CO2 30 27  GLUCOSE 120* 147*  BUN 6 8  CREATININE 0.69 0.65  CALCIUM 8.6* 7.6*    PT/INR:  Recent Labs  11/25/15 1824  LABPROT 17.0*  INR 1.37   Radiology: Dg Chest Port 1 View  11/27/2015  CLINICAL DATA:  Acute respiratory failure EXAM: PORTABLE CHEST 1 VIEW COMPARISON:  11/26/2015 FINDINGS: Endotracheal tube, nasogastric catheter and right jugular catheter are again seen and stable. Three right chest tubes are again identified and stable. No pneumothorax is seen. Patchy airspace disease is again noted bilaterally but slightly improved from the prior exam. IMPRESSION: Mildly improved aeration bilaterally. Tubes and lines as described. No  pneumothorax is seen. Electronically Signed   By: Inez Catalina M.D.   On: 11/27/2015 07:08   Dg Chest Port 1 View  11/26/2015  CLINICAL DATA:  Lung empyema. Status post VATS. Rule out pneumothorax. These results will be called to the ordering clinician or representative by the Radiology Department at the imaging location. EXAM: PORTABLE CHEST 1 VIEW COMPARISON:  One day prior FINDINGS: Endotracheal tube tip 9 mm above the carina. Orogastric tube with tip at the stomach and side port just below the GE junction. Right IJ central line with tip near the upper SVC. Three right-sided chest tubes, 2 apical and 1 basilar. Trace right apical pneumothorax. No visible residual pleural fluid. Bilateral airspace disease, asymmetric to the left, improved from chest x-ray yesterday. Request made to call about presence of pneumothorax. These results were called by telephone at the time of interpretation on 11/26/2015 at 1:52 pm to OR 17 circulator, who verbally acknowledged these results. IMPRESSION: 1. Endotracheal tube with tip 9 mm above the carina. Other tubes and central line are unremarkable. 2. Trace right apical pneumothorax.  No residual pleural fluid. 3. Improved aeration compared yesterday but still extensive airspace disease. Electronically Signed   By: Monte Fantasia M.D.   On: 11/26/2015 13:54   Dg Abd Portable 1v  11/26/2015  CLINICAL DATA:  NG tube placement EXAM: PORTABLE ABDOMEN - 1 VIEW COMPARISON:  None FINDINGS: NG tube is in place with the tip in the proximal to mid stomach. Side port near the GE junction. Nonobstructive bowel gas pattern. IMPRESSION: NG tube tip in the proximal to mid stomach. Electronically Signed   By: Rolm Baptise M.D.   On: 11/26/2015 16:44   Recent Results (from the past 240 hour(s))  MRSA PCR Screening     Status: None   Collection Time: 11/25/15 12:38 PM  Result Value Ref Range Status   MRSA by PCR NEGATIVE NEGATIVE Final    Comment:        The GeneXpert MRSA Assay  (FDA approved for NASAL specimens only), is one component of a comprehensive MRSA colonization surveillance program. It is not intended to diagnose MRSA infection nor to guide or monitor treatment for MRSA infections.   Culture, blood (routine x 2)     Status: None (Preliminary result)   Collection Time: 11/25/15  1:40 PM  Result Value Ref Range Status   Specimen Description BLOOD LEFT HAND  Final   Special Requests IN PEDIATRIC BOTTLE 0.5 CC  Final   Culture NO GROWTH 1 DAY  Final   Report Status PENDING  Incomplete  Culture, blood (routine x 2)     Status: None (Preliminary result)  Collection Time: 11/25/15  1:52 PM  Result Value Ref Range Status   Specimen Description BLOOD RIGHT HAND  Final   Special Requests IN PEDIATRIC BOTTLE 0.5 CC  Final   Culture NO GROWTH 1 DAY  Final   Report Status PENDING  Incomplete  Culture, Urine     Status: None   Collection Time: 11/25/15  3:28 PM  Result Value Ref Range Status   Specimen Description URINE, CATHETERIZED  Final   Special Requests NONE  Final   Culture NO GROWTH  Final   Report Status 11/26/2015 FINAL  Final  Surgical pcr screen     Status: None   Collection Time: 11/26/15  1:09 AM  Result Value Ref Range Status   MRSA, PCR NEGATIVE NEGATIVE Final   Staphylococcus aureus NEGATIVE NEGATIVE Final    Comment:        The Xpert SA Assay (FDA approved for NASAL specimens in patients over 72 years of age), is one component of a comprehensive surveillance program.  Test performance has been validated by [facility] for patients greater than or equal to 86 year old. It is not intended to diagnose infection nor to guide or monitor treatment.   Body fluid culture     Status: None (Preliminary result)   Collection Time: 11/26/15 11:18 AM  Result Value Ref Range Status   Specimen Description FLUID RIGHT LOWER PLEURAL  Final   Special Requests RIGHT LOWER LOBE FLUID  Final   Gram Stain   Final    ABUNDANT WBC PRESENT,BOTH  PMN AND MONONUCLEAR NO ORGANISMS SEEN    Culture PENDING  Incomplete   Report Status PENDING  Incomplete  Body fluid culture     Status: None (Preliminary result)   Collection Time: 11/26/15 12:07 PM  Result Value Ref Range Status   Specimen Description FLUID RIGHT PLEURAL  Final   Special Requests POF ZINACEF, ZITHROMAX, ROCEPHIN,VANCOMYCIN  Final   Gram Stain   Final    FEW WBC PRESENT,BOTH PMN AND MONONUCLEAR NO ORGANISMS SEEN    Culture PENDING  Incomplete   Report Status PENDING  Incomplete  Aerobic/Anaerobic Culture (surgical/deep wound)     Status: None (Preliminary result)   Collection Time: 11/26/15 12:10 PM  Result Value Ref Range Status   Specimen Description WOUND RIGHT PLEURAL  Final   Special Requests   Final    SWABS OF PLEURAL CAVITY POF VANCOMYCIN, ROCEPHIN, ZINACEF, ZITHROMAX   Gram Stain   Final    RARE WBC PRESENT,BOTH PMN AND MONONUCLEAR NO ORGANISMS SEEN    Culture PENDING  Incomplete   Report Status PENDING  Incomplete  Aerobic/Anaerobic Culture (surgical/deep wound)     Status: None (Preliminary result)   Collection Time: 11/26/15 12:41 PM  Result Value Ref Range Status   Specimen Description TISSUE RIGHT LOWER LUNG  Final   Special Requests   Final    PLEU PEEL  POF ZINACEF, ZITHROMAX, ROCEPHIN, VANCOMYCIN   Gram Stain   Final    ABUNDANT WBC PRESENT, PREDOMINANTLY PMN NO ORGANISMS SEEN    Culture PENDING  Incomplete   Report Status PENDING  Incomplete     Assessment/Plan: S/P Procedure(s) (LRB): VIDEO ASSISTED THORACOSCOPY (VATS)/EMPYEMA (Right) VIDEO BRONCHOSCOPY (N/A)  1 stable on vent- CCM managing 2 conts zosyn/vanco, leukocytosis improving 3 220 cc out of CT yesterday post op- no air leak, keep tubes in place 4 anemia slightly worse- monitor 5 renal fxn is normal, good UP 6 cbg's controlled  GOLD,WAYNE E 11/27/2015 8:19  AM Leave chest tubes in place today on suction Remove anterior chest tube tomorrow if no airleak Lungs appear  wet-20 mg Lasix IV daily  patient examined and medical record reviewed,agree with above note. Tharon Aquas Trigt III 11/27/2015

## 2015-11-27 NOTE — Op Note (Signed)
Kylie [name], [name] NO.:  [id number]  MEDICAL RECORD NO.:  [id number]  LOCATION:  2M09C                        FACILITY:  [facility]  PHYSICIAN:  Ivin Poot, M.D.  DATE OF BIRTH:  01/15/1971  DATE OF PROCEDURE:  11/26/2015 DATE OF DISCHARGE:                              OPERATIVE REPORT   OPERATION: 1. Video bronchoscopy. 2. Right VATS, drainage of empyema, decortication of right lower lobe.  ANESTHESIA:  General by Dr. Orene Desanctis.  SURGEON:  Ivin Poot, MD  ASSISTANT:  PA-C.  PREOPERATIVE DIAGNOSIS:  Right lower lobe pneumonia-consolidation, large loculated right empyema, acute respiratory insufficiency, on non- rebreather mask.  POSTOPERATIVE DIAGNOSIS:  Right lower lobe pneumonia-consolidation, large loculated right empyema, acute respiratory insufficiency, on non- rebreather mask.  CLINICAL NOTE:  The patient is a 45 year old obese, nondiabetic Caucasian female smoker with 2-3 weeks of progressive pulmonary symptoms, malaise, weight loss, finally fever, shortness of breath with any exertion and right pleuritic chest pain.  She presented for evaluation at urgent care and was transferred to the hospital, admitted to the ICU.  She was desaturating on room air.  Chest x-ray showed right lower lobe infiltrate.  CT scan showed loculated right pleural effusion- empyema.  She was started on broad-spectrum antibiotics, but her oxygenation, shortness of breath, and x-ray findings worsened. Attempted thoracentesis was unsuccessful.  Thoracic surgical evaluation was requested.  I examined the patient in her ICU room and discussed the diagnosis of right empyema and the procedure of right VATS and bronchoscopy.  I discussed the indications, benefits, and reasons for the surgery.  I discussed the alternatives and expected outcome of those alternatives.  I discussed with the patient risks to her the operation including risks of bleeding, prolonged air leak,  prolonged ventilator dependence, organ failure, recurrent infection, and death.  She demonstrated her understanding and agreed to proceed with surgery and what I felt was an informed consent.  OPERATIVE FINDINGS: 1. Large loculated right pleural effusion with dense peel on the right     lower lobe. 2. Successful drainage and decortication of the right lower lobe with     improved aeration at the end of the procedure. 3. Bronchoscopy negative for endobronchial lesions.  OPERATIVE PROCEDURE:  The patient was brought directly from the ICU to the preop holding area where she was prepared for surgery.  The informed consent was signed and the proper site was marked.  The patient was then brought to the operating room and placed supine on the operating table. General anesthesia was induced.  A single-lumen endotracheal tube was passed.  Bronchoscopy was performed.  After proper time-out, the fiberoptic scope was passed on the distal trachea and carina, which were normal.  The left mainstem bronchus and the segments of the left upper lobe, left lower lobe were visualized and had no endobronchial abnormalities.  The bronchoscope was passed on the right mainstem bronchus.  The right upper lobe segments, the right middle lobe and right lower lobe segments were visualized and there were no endobronchial abnormalities.  Washings were taken of the right lower lobe as well as brushing sent for cytology. Washings were sent for cytology as well as culture.  The bronchoscope was removed.  The patient was then reintubated with a double-lumen endotracheal tube. Her oxygenation was marginal with single lung ventilation.  So she will be maintained double lung ventilation until she was positioned and the procedure began.  She was turned right side up and the right chest was prepped and draped.  A proper time-out was performed.  A small incision was made in the tip of the scapula in the fifth interspace  and the incision was extended anteriorly for approximately 5-6 cm.  The pleural space was entered and large amounts of serosanguineous fluid was removed.  This was sent for cytology and culture.  The scope was inserted, but there was no visualization.  The incision was spread with a retractor without injury to the ribs.  Carefully and gradually the adhesions were taken down on the pleural space and all the loculated pockets of fluid were drained.  There was some significant solid material and a peel on the right lower lobe.  This was dissected off the visceral pleura.  There was one area of visceral pleural adhesion that led to a tear and this was repaired with an interrupted 4-0 chromic suture and covered with biologic adhesive-Coseal.  Finally, after careful tedious dissection, the right middle lobe and right lower lobe were all freed, and the pleural peel on the right lower lobe was removed.  The hemithorax was irrigated.  Three chest tubes were placed anteriorly, posteriorly, and in the costophrenic angle and brought out through a separate incisions and secured to the skin.  The lung was then fully inflated.  To be clear of the lung was inflated and then deflated for the operation.  She could not tolerate single lung ventilation due to dropping her saturations less than 80%.  The ribs were then reapproximated with pericostal sutures and the lung was fully expanded.  Then the sutures were tied.  The muscle layers closed in layers using interrupted #1 Vicryl.  The subcutaneous and skin layers were closed with running Vicryl.  Sterile dressings were applied. The chest tubes were connected to the underwater seal Pleur-evac drainage system.  Portable chest x-ray taken in the operating room showed good aeration of the right lung with evacuation of the pleural fluid and peel.  She returned directly to the ICU on the ventilator.     Ivin Poot, M.D.     PV/MEDQ  D:   11/26/2015  T:  11/27/2015  Job:  [id number]

## 2015-11-27 NOTE — Progress Notes (Signed)
FPTS Social Note  S: Pt remains intubated this morning. Did well overnight. Chest tubes in place.  O: BP 115/68 mmHg  Pulse 96  Temp(Src) 98.8 F (37.1 C) (Oral)  Resp 20  Ht 5\' 9"  (1.753 m)  Wt 214 lb 1.1 oz (97.1 kg)  BMI 31.60 kg/m2  SpO2 93%  LMP 11/23/2015  General: Opens eyes to voice, but very drowsy.  A/P: Intubated s/p VATS - Appreciate PCCM - Weaning vent - Chest tubes in place - Will continue to follow socially, happy to resume care when appropriate  Sela Hua, MD 11/27/2015, 11:32 AM PGY-1, [facility] pager [phone]

## 2015-11-27 NOTE — Progress Notes (Signed)
ETT found at 20cm at the lip, previous documentation showed the ETT charted at 22cm. ETT advanced back to 22cm at the lip, bbs heard, pt achieving better volumes, SpO2 increased to 96%. No suction set up for subglottic. Set up new canister, tubing, placed on intermittent suction. RT will continue to monitor.

## 2015-11-28 ENCOUNTER — Inpatient Hospital Stay (HOSPITAL_COMMUNITY): Payer: Medicaid Other

## 2015-11-28 DIAGNOSIS — A419 Sepsis, unspecified organism: Secondary | ICD-10-CM

## 2015-11-28 LAB — COMPREHENSIVE METABOLIC PANEL
ALBUMIN: 1.5 g/dL — AB (ref 3.5–5.0)
ALK PHOS: 103 U/L (ref 38–126)
ALT: 24 U/L (ref 14–54)
AST: 28 U/L (ref 15–41)
Anion gap: 7 (ref 5–15)
BILIRUBIN TOTAL: 0.2 mg/dL — AB (ref 0.3–1.2)
CALCIUM: 7.8 mg/dL — AB (ref 8.9–10.3)
CO2: 31 mmol/L (ref 22–32)
CREATININE: 0.53 mg/dL (ref 0.44–1.00)
Chloride: 103 mmol/L (ref 101–111)
GFR calc Af Amer: >60 mL/min (ref >60)
Glucose, Bld: 95 mg/dL (ref 65–99)
Potassium: 3.9 mmol/L (ref 3.5–5.1)
Sodium: 141 mmol/L (ref 135–145)
Total Protein: 5.5 g/dL — ABNORMAL LOW (ref 6.5–8.1)

## 2015-11-28 LAB — CBC WITH DIFFERENTIAL/PLATELET
BASOS ABS: 0 10*3/uL (ref 0.0–0.1)
Basophils Relative: 0 %
Eosinophils Absolute: 0.3 10*3/uL (ref 0.0–0.7)
Eosinophils Relative: 4 %
HEMATOCRIT: 29.5 % — AB (ref 36.0–46.0)
Hemoglobin: 9.1 g/dL — ABNORMAL LOW (ref 12.0–15.0)
LYMPHS PCT: 17 %
Lymphs Abs: 1.5 10*3/uL (ref 0.7–4.0)
MCH: 27.9 pg (ref 26.0–34.0)
MCHC: 30.8 g/dL (ref 30.0–36.0)
MCV: 90.5 fL (ref 78.0–100.0)
MONO ABS: 0.3 10*3/uL (ref 0.1–1.0)
Monocytes Relative: 3 %
NEUTROS ABS: 6.9 10*3/uL (ref 1.7–7.7)
Neutrophils Relative %: 76 %
Platelets: 331 10*3/uL (ref 150–400)
RBC: 3.26 MIL/uL — ABNORMAL LOW (ref 3.87–5.11)
RDW: 14.6 % (ref 11.5–15.5)
WBC: 9.1 10*3/uL (ref 4.0–10.5)

## 2015-11-28 LAB — GLUCOSE, CAPILLARY
GLUCOSE-CAPILLARY: 106 mg/dL — AB (ref 65–99)
Glucose-Capillary: 137 mg/dL — ABNORMAL HIGH (ref 65–99)
Glucose-Capillary: 107 mg/dL — ABNORMAL HIGH (ref 65–99)

## 2015-11-28 LAB — TRIGLYCERIDES: TRIGLYCERIDES: 145 mg/dL (ref <150)

## 2015-11-28 LAB — PHOSPHORUS: Phosphorus: 3.3 mg/dL (ref 2.5–4.6)

## 2015-11-28 LAB — MAGNESIUM: Magnesium: 2.2 mg/dL (ref 1.7–2.4)

## 2015-11-28 MED ORDER — PRO-STAT SUGAR FREE PO LIQD
60.0000 mL | Freq: Three times a day (TID) | ORAL | Status: DC
  Administered 2015-11-28 – 2015-12-01 (×9): 60 mL
  Filled 2015-11-28 (×11): qty 60

## 2015-11-28 MED ORDER — FLUCONAZOLE IN SODIUM CHLORIDE 100-0.9 MG/50ML-% IV SOLN
100.0000 mg | INTRAVENOUS | Status: DC
  Administered 2015-11-28 – 2015-12-01 (×4): 100 mg via INTRAVENOUS
  Filled 2015-11-28 (×6): qty 50

## 2015-11-28 MED ORDER — METHADONE HCL 5 MG PO TABS
120.0000 mg | ORAL_TABLET | Freq: Every day | ORAL | Status: DC
  Administered 2015-11-28: 120 mg via ORAL
  Filled 2015-11-28 (×2): qty 24

## 2015-11-28 MED ORDER — METHADONE HCL 10 MG PO TABS
120.0000 mg | ORAL_TABLET | Freq: Every day | ORAL | Status: DC
  Administered 2015-11-29 – 2015-12-05 (×7): 120 mg via ORAL
  Filled 2015-11-28 (×6): qty 12
  Filled 2015-11-28: qty 24

## 2015-11-28 MED ORDER — VITAL HIGH PROTEIN PO LIQD
1000.0000 mL | ORAL | Status: DC
  Administered 2015-11-28 (×2)
  Administered 2015-11-28: 1000 mL
  Administered 2015-11-29 (×2)
  Administered 2015-11-29 – 2015-11-30 (×2): 1000 mL
  Administered 2015-11-30 (×2)

## 2015-11-28 MED ORDER — FAMOTIDINE 40 MG/5ML PO SUSR
20.0000 mg | Freq: Two times a day (BID) | ORAL | Status: DC
  Administered 2015-11-28 – 2015-12-01 (×7): 20 mg
  Filled 2015-11-28 (×8): qty 2.5

## 2015-11-28 MED ORDER — SENNOSIDES 8.8 MG/5ML PO SYRP
5.0000 mL | ORAL_SOLUTION | Freq: Two times a day (BID) | ORAL | Status: DC
  Administered 2015-11-28 – 2015-11-30 (×6): 5 mL
  Filled 2015-11-28 (×8): qty 5

## 2015-11-28 NOTE — Progress Notes (Signed)
FPTS Social Note  S: Pt remains intubated this morning. Still with chest tube to suction. Pt is awake and answers questions with nodding or shaking her head.  O: BP 113/68 mmHg  Pulse 81  Temp(Src) 98.6 F (37 C) (Oral)  Resp 19  Ht 5\' 9"  (1.753 m)  Wt 217 lb 13 oz (98.8 kg)  BMI 32.15 kg/m2  SpO2 96%  LMP 11/23/2015  General: Awake, alert, diaphoretic in appearance  A/P: Intubated s/p VATS - Appreciate PCCM - Weaning vent - Chest tubes in place - Will continue to follow socially, happy to resume care when appropriate  Sela Hua, MD 11/28/2015, 8:45 AM PGY-1, [facility] pager [phone]

## 2015-11-28 NOTE — Progress Notes (Signed)
Patient able to nod head yes to pain and point to her ride side. Sedation increased due to pain and restlessness. Boyfriend at bedside patient more agitated

## 2015-11-28 NOTE — Progress Notes (Signed)
Paged CCM doctor at [phone] to report hgb 6.4, waiting return call, day shift rn aware and comments will notify md of results.

## 2015-11-28 NOTE — Progress Notes (Signed)
Anterior chest tube pulled per protocol and as ordered. Dressing applied, Patient RN aware and in room will monitor patient. Javonnie Illescas, Bettina Gavia RN

## 2015-11-28 NOTE — Progress Notes (Addendum)
[facility] DAILY ICU PROGRESS NOTE                   [address]            [place] [address]          [phone]   2 Days Post-Op Procedure(s) (LRB): VIDEO ASSISTED THORACOSCOPY (VATS)/EMPYEMA (Right) VIDEO BRONCHOSCOPY (N/A)  Total Length of Stay:  LOS: 5 days   Subjective: Patient is awake and alert on vent. She nods yes when asked if she is in pain.  Objective: Vital signs in last 24 hours: Temp:  [98.1 F (36.7 C)-99 F (37.2 C)] 98.6 F (37 C) (06/29 0740) Pulse Rate:  [78-97] 87 (06/29 0700) Cardiac Rhythm:  [-] Normal sinus rhythm (06/29 0400) Resp:  [17-21] 18 (06/29 0700) BP: (86-129)/(49-71) 104/66 mmHg (06/29 0600) SpO2:  [93 %-100 %] 94 % (06/29 0700) Arterial Line BP: (73-136)/(40-67) 127/67 mmHg (06/29 0700) FiO2 (%):  [40 %] 40 % (06/29 0400) Weight:  [217 lb 13 oz (98.8 kg)] 217 lb 13 oz (98.8 kg) (06/29 0625)  Filed Weights   11/24/15 2213 11/27/15 0100 11/28/15 0625  Weight: 216 lb 6.4 oz (98.158 kg) 214 lb 1.1 oz (97.1 kg) 217 lb 13 oz (98.8 kg)    Weight change: 3 lb 12 oz (1.7 kg)   Hemodynamic parameters for last 24 hours:    Intake/Output from previous day: 06/28 0701 - 06/29 0700 In: 2394.8 [I.V.:1459.8; NG/GT:235; IV Piggyback:700] Out: 3185 [Urine:3040; Chest Tube:145]  Intake/Output this shift: Total I/O In: 50 [IV Piggyback:50] Out: 180 [Urine:150; Chest Tube:30]  Current Meds: Scheduled Meds: . acetaminophen  1,000 mg Oral Q6H   Or  . acetaminophen (TYLENOL) oral liquid 160 mg/5 mL  1,000 mg Oral Q6H  . antiseptic oral rinse  7 mL Mouth Rinse QID  . baclofen  5 mg Per Tube Q8H  . bisacodyl  10 mg Oral Daily  . chlorhexidine gluconate (SAGE KIT)  15 mL Mouth Rinse BID  . famotidine (PEPCID) IV  20 mg Intravenous Q12H  . furosemide  20 mg Intravenous Daily  . gabapentin  300 mg Per Tube Q8H  . ipratropium-albuterol  3 mL Nebulization Q6H  . levothyroxine  50 mcg Oral QAC breakfast  . methadone  120 mg Oral  Daily  . piperacillin-tazobactam (ZOSYN)  IV  3.375 g Intravenous Q8H  . QUEtiapine  150 mg Per Tube Q12H  . senna-docusate  1 tablet Oral QHS  . vancomycin  1,000 mg Intravenous Q8H   Continuous Infusions: . fentaNYL infusion INTRAVENOUS 200 mcg/hr (11/28/15 0409)  . propofol (DIPRIVAN) infusion 30 mcg/kg/min (11/28/15 0122)   PRN Meds:.fentaNYL, midazolam, ondansetron (ZOFRAN) IV, potassium chloride  General appearance: alert and no distress Heart: RRR Lungs: Coarse breath sounds bilaterally Extremities: SCDs in place Wound: Dressings dry Chest tubes: to suction, NO air leak  Lab Results: CBC: Recent Labs  11/27/15 0320 11/28/15 0520  WBC 15.9* 9.1  HGB 9.8* 9.1*  HCT 31.6* 29.5*  PLT 331 331   BMET:  Recent Labs  11/27/15 0320 11/28/15 0520  NA 139 141  K 4.0 3.9  CL 101 103  CO2 27 31  GLUCOSE 147* 95  BUN 8 <5*  CREATININE 0.65 0.53  CALCIUM 7.6* 7.8*    PT/INR:  Recent Labs  11/25/15 1824  LABPROT 17.0*  INR 1.37   Radiology: Dg Chest Port 1 View  11/28/2015  CLINICAL DATA:  Check chest tube position EXAM: PORTABLE CHEST  1 VIEW COMPARISON:  11/27/2015 FINDINGS: Endotracheal tube, nasogastric catheter, right jugular line and 3 right-sided chest tubes are again noted and stable. Persistent opacities are noted bilaterally stable from the prior exam. No recurrent pneumothorax is seen. IMPRESSION: Stable appearance when compared with the previous exam. Electronically Signed   By: Inez Catalina M.D.   On: 11/28/2015 07:20     Assessment/Plan: S/P Procedure(s) (LRB): VIDEO ASSISTED THORACOSCOPY (VATS)/EMPYEMA (Right) VIDEO BRONCHOSCOPY (N/A)  1. CV-SR 2. Pulmonary-Vent management per CCM. Chest tubes with 145 cc last 24 hours. Chest tubes are to suction. There is no air leak. Will discuss with Dr. Prescott Gum if able to place to water seal and/or remove 1 chest tube.CXR this am is relatively stable (no pneumothorax). 3. ID-on Vancomycin and Zosyn. Cultures  show no growth to date. AFB is negative. 4. Anemia-H and H slightly decreased to 9.1 and 29.5    ZIMMERMAN,DONIELLE M PA-C 11/28/2015 7:54 AM  Patient is alert on the ventilator but did not tolerate weaning because of desaturation Anterior chest tube out Minimal chest tube drainage Preliminary cultures show a fungal species-we'll start IV Diflucan Tharon Aquas trigt M.D.

## 2015-11-28 NOTE — Progress Notes (Signed)
D.Zimmerman PA for CVTS notified that micro called with a possible yeast from Lung cultures obtained in the OR. The final results will be resulted tomorrow. No further orders at this time

## 2015-11-28 NOTE — Progress Notes (Signed)
Paged Dr Titus Mould at [phone] to report hgb 6.4, waiting return call.

## 2015-11-28 NOTE — Progress Notes (Signed)
PULMONARY / CRITICAL CARE MEDICINE   Name: Kylie [name] MRN: [id number] DOB: 07/29/1970    ADMISSION DATE:  11/23/2015 CONSULTATION DATE:  6/26  REFERRING MD:  Erin Hearing / FMTS  CHIEF COMPLAINT:  Progressive respiratory failure and abnormal CXR   HISTORY OF PRESENT ILLNESS:   45 y.o. female admitted 6/24 with cc: progressive shortness of breath. Reported it had been present for the last month, coming and going. Over the last two weeks and especially the last couple of days, dyspnea has gotten worse, and patient has had dry cough. She has had associated right UQ pain and right lower chest pain, worsened by cough. Cough has also caused loss of bladder control. She reported having subjective fever "a couple of times" over the last month. She denies sick contacts. She has not been hospitalized or had antibiotics within the last 3 months. She has not tried any medicines for her symptoms. Some nausea but no v/d. She was admitted to the IM service. Treated w/ working dx of CAP. PCCM asked to see on 6/26 when pt found confused and more hypoxic w/ sats in 40s on room air.   SUBJECTIVE: No acute events overnight. Patient failed spontaneous breathing trial this morning with agitation.  REVIEW OF SYSTEMS:  Unable to obtain with intubation & sedation.  VITAL SIGNS: BP 114/68 mmHg  Pulse 92  Temp(Src) 98.6 F (37 C) (Oral)  Resp 18  Ht 5\' 9"  (1.753 m)  Wt 98.8 kg (217 lb 13 oz)  BMI 32.15 kg/m2  SpO2 97%  LMP 11/23/2015  HEMODYNAMICS:    VENTILATOR SETTINGS: Vent Mode:  [-] PRVC FiO2 (%):  [40 %] 40 % Set Rate:  [18 bmp] 18 bmp Vt Set:  [530 mL] 530 mL PEEP:  [5 cmH20] 5 cmH20 Plateau Pressure:  [28 cmH20-32 cmH20] 29 cmH20  INTAKE / OUTPUT: I/O last 3 completed shifts: In: 4584.4 [I.V.:3099.4; NG/GT:235; IV Piggyback:1250] Out: 4100 [Urine:3865; Chest Tube:235]  PHYSICAL EXAMINATION: General: Sedated. No acute distress. Opens eyes to voice. Neuro:  On sedation. Moving all 4  extremities spontaneously. Attempts to nod to questions. Pupils symmetric. HEENT:  Moist mucous membranes. No scleral injection . Endotracheal tube in place. Cardiovascular:  Regular rate. No edema or appreciable JVD. Lungs:  Coarse breath sounds bilaterally unchanged. Symmetric chest wall rise on ventilator. Right chest tubes in place. Musculoskeletal:   No joint deformity or effusion.  Skin:  Warm and dry. No rash on exposed skin.   LABS:  BMET  Recent Labs Lab 11/26/15 0252 11/27/15 0320 11/28/15 0520  NA 142 139 141  K 3.6 4.0 3.9  CL 103 101 103  CO2 30 27 31   BUN 6 8 <5*  CREATININE 0.69 0.65 0.53  GLUCOSE 120* 147* 95    Electrolytes  Recent Labs Lab 11/26/15 0252 11/27/15 0320 11/28/15 0520  CALCIUM 8.6* 7.6* 7.8*  MG  --  2.2 2.2  PHOS  --  2.5 3.3    CBC  Recent Labs Lab 11/26/15 0252 11/27/15 0320 11/28/15 0520  WBC 21.4* 15.9* 9.1  HGB 10.9* 9.8* 9.1*  HCT 34.2* 31.6* 29.5*  PLT 349 331 331    Coag's  Recent Labs Lab 11/25/15 1824  APTT 26  INR 1.37    Sepsis Markers No results for input(s): LATICACIDVEN, PROCALCITON, O2SATVEN in the last 168 hours.  ABG  Recent Labs Lab 11/26/15 0055 11/26/15 1448 11/27/15 0445  PHART 7.366 7.383 7.414  PCO2ART 58.2* 55.9* 49.9*  PO2ART 99.2 151.0* 101*  Liver Enzymes  Recent Labs Lab 11/23/15 1551 11/25/15 1824 11/28/15 0520  AST 17 28 28   ALT 8* 19 24  ALKPHOS 94 119 103  BILITOT 0.4 <0.1* 0.2*  ALBUMIN 3.3* 2.3* 1.5*    Cardiac Enzymes  Recent Labs Lab 11/24/15 0439  TROPONINI <0.03    Glucose  Recent Labs Lab 11/27/15 1124 11/27/15 1520 11/27/15 1948 11/27/15 2333 11/28/15 0338 11/28/15 0742  GLUCAP 108* 132* 159* 153* 106* 137*    Imaging Dg Chest Port 1 View  11/28/2015  CLINICAL DATA:  Check chest tube position EXAM: PORTABLE CHEST 1 VIEW COMPARISON:  11/27/2015 FINDINGS: Endotracheal tube, nasogastric catheter, right jugular line and 3 right-sided  chest tubes are again noted and stable. Persistent opacities are noted bilaterally stable from the prior exam. No recurrent pneumothorax is seen. IMPRESSION: Stable appearance when compared with the previous exam. Electronically Signed   By: Inez Catalina M.D.   On: 11/28/2015 07:20    STUDIES:  CT Chest 6/24: 1. Consolidated lung in the RIGHT lower lobe with differential including pneumonia versus neoplasm. 2. Peribronchial and perihilar thickening on the RIGHT consistent with infection or neoplasm. 3. Mild mediastinal adenopathy differential including metastatic adenopathy versus reactive adenopathy 4. Partially loculated pleural fluid in the lower RIGHT thorax. Abd X-ray 6/27:  NGT w/ tip in proximal to mid stomach. Port CXR 6/28: Endotracheal tube approximately 4.8 cm above carina. Bilateral opacities improving. Right chest tube in good position. Right internal jugular central venous catheter in good position. Enteric feeding tube coursing below diaphragm with side-port below what appears to be esophageal sphincter. Port CXR 6/29: Endotracheal tube and central venous catheter in good position. Some improvement in left basilar opacity. Persistent right lung opacities. Right chest tubes in place. EKG 6/29:  QTc 427ms.  MICROBIOLOGY: MRSA PCR 6/27 & 6/26:  Negative  Urine Ctx 6/26:  Negative Urine Strep Ag 6/26:  Negative Blood Ctx x2 6/26>>>  HIV 6/26:  Nonreactive  R Pleural Fluid 6/27>>> R Pleural Anaerobic Ctx 6/27>>> R Pleural Peel Routine, AFB & Fungal Ctx 6/27>>>  ANTIBIOTICS: Azithromycin 6/24 - 6/27 Rocephin 6/24 - 6/27 Cefuroxime 6/27 (surgical ppx) Vancomycin 6/26>>> Zosyn 6/27>>>  SIGNIFICANT EVENTS: 6/26 - Admitted to 67M for worsening hypoxia and altered ental status 6/27 - OR for R VATS  LINES/TUBES: L RAD ART LINE 6/27 - 6/28 OETT 7.5 6/27>>> R Y CHEST TUBE S/P VATS 6/27>>> FOLEY 6/27>>> OGT 6/27>>> R IJ CVL 6/27>>> PIV X2  ASSESSMENT /  PLAN:  PULMONARY A: Acute Hypoxic Respiratory Failure ARDS Severe CAP Right Complicated Parapneumonic Effusion - S/P VATS 6/27. CT in place. H/O VCD  P:   Full Vent Support Daily SBT & Sedation Vacation Chest Tube mgt per CVTS Lasix IV daily Duoneb q6hr  CARDIOVASCULAR A:  No acute issues.  P:  Monitor on telemetry Vitals per unit protocol EKG daily to monitor QTc on Methadone & Seroquel  RENAL A:   No acute issues.  P:   Trending UOP with Foley Monitoring electrolytes & renal function daily  Replacing electrolytes as indicated  GASTROINTESTINAL A:   Nutrition Abdominal Pain - Resolved. Likely due to CAP.  P:  NPO Tube Feedings per nutrition Change to Pepcid VT q12hr Dulcolax VT daily Senna VT bid  HEMATOLOGIC A:   Anemia - Mild & slightly worse. No signs of active bleeding. Leukocytosis - Resolved. Secondary to sepsis.  P:  Trending cell counts daily w/ CBC Transfuse for Hgb <7.0 or active bleeding SCDs Holding on  chemical DVT ppx for now  INFECTIOUS A:   Severe CAP w/ Complicated Parapneumonic Effusion H/O Hepatitis B Infection  P:   Empiric Vancomycin & Zosyn Day #3 Awaiting finalization of cultures  ENDOCRINE A:   H/O Hypothyroidism   P:   Synthroid VT daily Accu-Checks q4hr w/ notification parameters for MD  NEUROLOGIC A:   Sedation on Ventilator H/O Anxiety H/o Bipolar Disease Chronic Pain  P:   RASS goal: 0 to -1 Fentanyl gtt & IV prn Propofol gtt  Versed IV prn Neurontin 300mg  VT q8hr (home dose 300mg  tid) Continue Methadone 120mg  daily (home dose) Continue Seroquel 150 BID (home dose XR 300mg ) Baclofen 5mg  VT q8hr (home dose 10mg  tid) Hold Vistaril & Lithium  FAMILY  - Updates: No family at bedside 6/29.  - Inter-disciplinary family meet or Palliative Care meeting due by:  7/2  TODAY'S SUMMARY:  45 year old female acute hypoxic respiratory failure from severe community acquired pneumonia and complicated right  parapneumonic effusion. Status post VATS. Continuing current doses of Seroquel, Neurontin, methadone, and baclofen. Plan to repeat spontaneous breathing trial in the morning. Hopefully with reinitiation of all medications this will ease some of her discomfort and allow further weaning of IV fentanyl and propofol.  I have spent a total of 32 minutes of critical care time today caring for the patient and reviewing the patient's electronic medical record.  Sonia Baller Ashok Cordia, M.D. [facility] Pager:  [phone] After 3pm or if no response, call [phone] 11/28/2015 10:19 AM

## 2015-11-28 NOTE — Progress Notes (Signed)
Initial Nutrition Assessment  DOCUMENTATION CODES:   Obesity unspecified  INTERVENTION:    Initiate TF via OGT with Vital High Protein at goal rate of 20 ml/h (480 ml per day) and Prostat 60 ml TID to provide 1080 kcals, 132 gm protein, 401 ml free water daily.  Total intake with TF + Propofol will be 1545 kcals (slightly exceeds kcal needs to ensure adequate protein intake).  NUTRITION DIAGNOSIS:   Inadequate oral intake related to inability to eat as evidenced by NPO status.  GOAL:   Provide needs based on ASPEN/SCCM guidelines  MONITOR:   Vent status, Labs, Weight trends, TF tolerance, I & O's  REASON FOR ASSESSMENT:   Rounds Enteral/tube feeding initiation and management  ASSESSMENT:   45 y.o. female admitted 6/24 with complaint of progressive shortness of breath related to CAP. Transferred to MICU on 6/26 for worsening hypoxia and AMS. S/P right VATS on 6/27.   Nutrition focused physical exam completed.  No muscle or subcutaneous fat depletion noticed. Labs and medications reviewed. CBG's: [id number] Spoke with Dr. Ashok Cordia, okay for RD to order TF. OGT in place.  Patient is currently intubated on ventilator support Temp (24hrs), Avg:98.7 F (37.1 C), Min:98.1 F (36.7 C), Max:99 F (37.2 C)  Propofol: 17.6 ml/hr providing 465 kcals per day   Diet Order:  Diet NPO time specified  Skin:  Reviewed, no issues  Last BM:  PTA  Height:   Ht Readings from Last 1 Encounters:  11/26/15 5\' 9"  (1.753 m)    Weight:   Wt Readings from Last 1 Encounters:  11/28/15 217 lb 13 oz (98.8 kg)    Ideal Body Weight:  65.9 kg  BMI:  Body mass index is 32.15 kg/(m^2).  Estimated Nutritional Needs:   Kcal:  [id number]  Protein:  132 gm  Fluid:  1.8-2 L  EDUCATION NEEDS:   No education needs identified at this time  Molli Barrows, [place], [place], [place] Pager [phone] After Hours Pager [phone]

## 2015-11-29 ENCOUNTER — Inpatient Hospital Stay (HOSPITAL_COMMUNITY): Payer: Medicaid Other

## 2015-11-29 LAB — GLUCOSE, CAPILLARY
GLUCOSE-CAPILLARY: 119 mg/dL — AB (ref 65–99)
GLUCOSE-CAPILLARY: 114 mg/dL — AB (ref 65–99)
Glucose-Capillary: 97 mg/dL (ref 65–99)
Glucose-Capillary: 123 mg/dL — ABNORMAL HIGH (ref 65–99)
Glucose-Capillary: 120 mg/dL — ABNORMAL HIGH (ref 65–99)
Glucose-Capillary: 116 mg/dL — ABNORMAL HIGH (ref 65–99)
Glucose-Capillary: 108 mg/dL — ABNORMAL HIGH (ref 65–99)

## 2015-11-29 LAB — CBC WITH DIFFERENTIAL/PLATELET
BASOS PCT: 0 %
Basophils Absolute: 0 10*3/uL (ref 0.0–0.1)
EOS ABS: 0.5 10*3/uL (ref 0.0–0.7)
EOS PCT: 6 %
HCT: 30.5 % — ABNORMAL LOW (ref 36.0–46.0)
Hemoglobin: 9.4 g/dL — ABNORMAL LOW (ref 12.0–15.0)
Lymphocytes Relative: 16 %
Lymphs Abs: 1.4 10*3/uL (ref 0.7–4.0)
MCH: 28.7 pg (ref 26.0–34.0)
MCHC: 30.8 g/dL (ref 30.0–36.0)
MCV: 93.3 fL (ref 78.0–100.0)
MONO ABS: 0.4 10*3/uL (ref 0.1–1.0)
MONOS PCT: 4 %
Neutro Abs: 6.5 10*3/uL (ref 1.7–7.7)
Neutrophils Relative %: 74 %
PLATELETS: 381 10*3/uL (ref 150–400)
RBC: 3.27 MIL/uL — ABNORMAL LOW (ref 3.87–5.11)
RDW: 14.8 % (ref 11.5–15.5)
WBC: 8.7 10*3/uL (ref 4.0–10.5)

## 2015-11-29 LAB — TRIGLYCERIDES: Triglycerides: 165 mg/dL — ABNORMAL HIGH (ref <150)

## 2015-11-29 LAB — RENAL FUNCTION PANEL
ANION GAP: 8 (ref 5–15)
Albumin: 1.5 g/dL — ABNORMAL LOW (ref 3.5–5.0)
BUN: 10 mg/dL (ref 6–20)
CALCIUM: 8.2 mg/dL — AB (ref 8.9–10.3)
CO2: 33 mmol/L — AB (ref 22–32)
CREATININE: 0.54 mg/dL (ref 0.44–1.00)
Chloride: 99 mmol/L — ABNORMAL LOW (ref 101–111)
GFR calc Af Amer: >60 mL/min (ref >60)
GFR calc non Af Amer: >60 mL/min (ref >60)
GLUCOSE: 118 mg/dL — AB (ref 65–99)
Phosphorus: 3.8 mg/dL (ref 2.5–4.6)
Potassium: 3.3 mmol/L — ABNORMAL LOW (ref 3.5–5.1)
SODIUM: 140 mmol/L (ref 135–145)

## 2015-11-29 LAB — TYPE AND SCREEN
ABO/RH(D): A POS
Antibody Screen: NEGATIVE
Unit division: 0
Unit division: 0

## 2015-11-29 LAB — MAGNESIUM: MAGNESIUM: 2.1 mg/dL (ref 1.7–2.4)

## 2015-11-29 MED ORDER — HEPARIN SODIUM (PORCINE) 5000 UNIT/ML IJ SOLN: 5000.0000 [IU] | Freq: Three times a day (TID) | INTRAMUSCULAR | Status: DC

## 2015-11-29 MED ORDER — ENOXAPARIN SODIUM 40 MG/0.4ML ~~LOC~~ SOLN
40.0000 mg | SUBCUTANEOUS | Status: DC
  Administered 2015-11-29 – 2015-12-05 (×7): 40 mg via SUBCUTANEOUS
  Filled 2015-11-29 (×7): qty 0.4

## 2015-11-29 NOTE — Discharge Summary (Signed)
[facility] Discharge Summary  Patient name: Kylie [name] Medical record number: [id number] Date of birth: 08-14-1970 Age: 45 y.o. Gender: female Date of Admission: 11/23/2015  Date of Discharge: 12/05/15 Admitting Physician: Lind Covert, MD  Primary Care Provider: Zigmund Gottron, MD Consultants: PCCM  Indication for Hospitalization: dyspnea/hypoxia, right-sided chest pain  Discharge Diagnoses/Problem List:  Patient Active Problem List   Diagnosis Date Noted  . Acute hypoxemic respiratory failure ([place]) 12/05/2015  . Generalized anxiety disorder   . Chest tube in place   . Dyspnea   . SOB (shortness of breath)   . Empyema lung ([place])   . Empyema ([place])   . Acute respiratory failure ([place])   . Mediastinal lymphadenopathy   . Atypical chest pain   . AP (abdominal pain)   . Bipolar I disorder ([place])   . Other specified hypothyroidism   . Chronic pain syndrome   . Acute on chronic respiratory failure ([place])   . Pleural effusion   . CAP (community acquired pneumonia) 11/23/2015  . History of abnormal cervical Pap smear 10/03/2015  . Urinary, incontinence, stress female 10/02/2015  . Major depressive disorder, recurrent episode, moderate ([place])   . Major depression, recurrent ([place]) 04/06/2014  . MDD (major depressive disorder), recurrent severe, without psychosis ([place]) 04/05/2014  . Suicide attempt ([place])   . Hip pain, left 08/14/2011  . PAP SMER W/ATYPCL SQAUMOUS CELL NOT EXCLD HI GRD 04/09/2010  . HEPATITIS B 01/31/2008  . Drug abuse in remission 04/04/2007  . OBESITY, NOS 07/29/2006  . ANXIETY 07/29/2006  . TOBACCO DEPENDENCE 07/29/2006  . Bipolar I disorder, most recent episode mixed ([place]) 07/29/2006  . Chronic bilateral low back pain without sciatica 07/29/2006     Disposition: Home with oxygen 3L via [place]  Discharge Condition: Stable, improved  Discharge Exam:  General: Well appearing, middle aged female in NAD Cardiovascular:  RRR, s1/s2 present, no MRG appreciated Respiratory: CTABL Abdomen: soft, non-tender, non-distended.  Extremities: warm and well perfused, no cyanosis or edema MSK: Wound on right side appears c/d and non-indurated.  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 CTA chest showed large consolidation of the RLL with peribronchial and perihilar thickening and partially loculated fluid with moderate mediastinal lymphadenopathy. She had a new O2 requirement of 2L for desaturations to 84%. WBC 17.3, qSOFA 1. Hospital course is described by problem list below.  Shortness of breath 2/2 severe CAP with parapneumonic effusion: She was started on Rocephin and Azithromycin. She had rapid response called on her, where she pulled off her [place], got out of bed, and was noted to be "grey in color" by the RN. Pulse ox was checked and was 43%. She was placed on 6L O2 by  and sats remained at 70-75%. She was then transitioned to non-rebreather. CXR showed extensive loculated effusion with progression of airspace opacity compared to previous CXR. Pt was transferred to stepdown. CCM was consulted and added Vancomycin to her antibiotic regimen. Bedside ultrasound was performed and showed right empyema. Cardiothoracic surgery was consulted for VATS. VATS was performed on 6/27 and 3 chest tubes were placed. Pt was switched from Vancomycin/Rocephin/Azithromycin to Vancomycin/Zosyn. Pt remained intubated after the procedure and care was transferred to CCM. She remained intubated  from 6/27 to 7/2. She was gently diuresed with Lasix 20mg  IV daily. Tube feeds were started via OG tube on 6/29. Vancomycin was d/c'ed 6/30 and Zosyn was continued. Cultures from VATS grew no organisms.  She  was transferred onto the floor on 7/2 and improved under our care.  She was requiring 3L of oxygen to have sats in the mid 90s and desatted into the 80s with mild ambulation.  She was sent home with 3L o2 via [place].    Right sided chest pain/abdominal pain: We thought her chest pain was likely chest wall pain in the setting of pneumonia with radiation to the RUQ of the abdomen. EKG was unremarkable and troponins were negative. Abdominal US was performed at OSH and showed mild dilation of the common bile duct without evidence of cholecystitis and right pleural effusion. Lipase normal. T-bili normal. Pt's chest pain and abdominal pain resolved on its own.   The remainder of her chronic medical conditions were stable this hospitalization.   Issues for Follow Up:  1.  Oxygen requirement:  The patient was discharged on 3L of oxygen because she was desatting into the low 80s with ambulation, however she was medically cleared otherwise.  She has a follow up appointment in one week seen below and her oxygen requirements can be followed up at that time.    Significant Procedures: VATS with 3 chest tubes placed 6/27, intubated 6/27-7/2  Significant Labs and Imaging:   Recent Labs Lab 12/03/15 0538 12/04/15 0713 12/05/15 0609  WBC 9.2 8.5 8.8  HGB 11.0* 10.2* 10.7*  HCT 35.1* 33.0* 34.7*  PLT 520* 506* 559*    Recent Labs Lab 12/01/15 0400 12/02/15 0239 12/03/15 0538 12/04/15 0713 12/05/15 0609  NA 144 148*  148* 140 138 137  K 3.5 3.6  3.7 3.8 3.8 4.2  CL 102 96*  97* 93* 96* 100*  CO2 32 34*  31 37* 34* 26  GLUCOSE 122* 101*  97 106* 111* 100*  BUN 19 17  17 15 8 8   CREATININE 0.52 0.65  0.58 0.67 0.64 0.59  CALCIUM 8.5* 10.0  9.9 9.3 9.1 9.1  MG 2.2 2.2 2.2 2.3 2.3  PHOS 3.3 3.5 3.9 4.2 3.6  ALBUMIN 1.6* 2.1* 2.2* 2.1* 2.3*    CTA Chest: large consolidation of RLL with peribronchial and perihilar thickening and paritally loculated fluid. Moderate mediastinal lymphadenopathy  concerning for reactive adenopathy in setting of infection vs. metastatic lymphadenopathy.  Results/Tests Pending at Time of Discharge: None  Discharge Medications:    Medication List    TAKE these medications        amoxicillin-clavulanate 875-125 MG tablet  Commonly known as:  AUGMENTIN  Take 1 tablet by mouth every 12 (twelve) hours.     baclofen 10 MG tablet  Commonly known as:  LIORESAL  Take 0.5 tablets (5 mg total) by mouth 3 (three) times daily.     gabapentin 300 MG capsule  Commonly known as:  NEURONTIN  Take 1 capsule (300 mg total) by mouth 3 (three) times daily.     hydrOXYzine 25 MG capsule  Commonly known as:  VISTARIL  Take 1 capsule (25 mg total) by mouth 3 (three) times daily as needed for anxiety.     levothyroxine 50 MCG tablet  Commonly known as:  SYNTHROID, LEVOTHROID  Take 1 tablet (50 mcg total) by mouth daily.     lithium 300 MG tablet  Take 1 tablet (300 mg total) by mouth 2 (two)  times daily.     methadone 10 MG tablet  Commonly known as:  DOLOPHINE  Take 120 mg by mouth daily.     QUEtiapine 300 MG 24 hr tablet  Commonly known as:  SEROQUEL XR  Take 1 tablet (300 mg total) by mouth at bedtime.        Discharge Instructions: Please refer to Patient Instructions section of EMR for full details.  Patient was counseled important signs and symptoms that should prompt return to medical care, changes in medications, dietary instructions, activity restrictions, and follow up appointments.   Follow-Up Appointments: Follow-up Information    Follow up with Ivin Poot III, MD On 12/18/2015.   Specialty:  Cardiothoracic Surgery   Why:  Appointment is at 10:30, please get CXR at 10: 00 at [facility] imaging located on first floor of our office building   Contact information:   [place] [address] [phone]       Follow up with [facility].   Why:  Home oxygen arranged   Contact information:    [address] [place] [address] [phone]       Follow up with Melina Schools, DO On 12/10/2015.   Specialty:  Family Medicine   Why:  10:45am   Contact information:   [place] [address] [phone]       Eloise Levels, MD 12/06/2015, 4:47 PM PGY-1, [place]

## 2015-11-29 NOTE — Progress Notes (Signed)
T[facility] DAILY ICU PROGRESS NOTE                   [address]            [place] [address]          [phone]   3 Days Post-Op Procedure(s) (LRB): VIDEO ASSISTED THORACOSCOPY (VATS)/EMPYEMA (Right) VIDEO BRONCHOSCOPY (N/A)  Total Length of Stay:  LOS: 6 days   Subjective: Sedated on vent, opens eyes  Objective: Vital signs in last 24 hours: Temp:  [97.4 F (36.3 C)-98.6 F (37 C)] 97.5 F (36.4 C) (06/30 1124) Pulse Rate:  [80-93] 82 (06/30 1200) Cardiac Rhythm:  [-] Normal sinus rhythm (06/30 1200) Resp:  [17-28] 18 (06/30 1200) BP: (87-112)/(49-78) 92/56 mmHg (06/30 1200) SpO2:  [93 %-100 %] 96 % (06/30 1200) FiO2 (%):  [40 %-50 %] 40 % (06/30 1200) Weight:  [216 lb 0.8 oz (98 kg)] 216 lb 0.8 oz (98 kg) (06/30 0251)  Filed Weights   11/27/15 0100 11/28/15 0625 11/29/15 0251  Weight: 214 lb 1.1 oz (97.1 kg) 217 lb 13 oz (98.8 kg) 216 lb 0.8 oz (98 kg)    Weight change: -1 lb 12.2 oz (-0.8 kg)   Hemodynamic parameters for last 24 hours:    Vent Mode:  [-] PRVC FiO2 (%):  [40 %-50 %] 40 % Set Rate:  [18 bmp] 18 bmp Vt Set:  [530 mL] 530 mL PEEP:  [5 cmH20] 5 cmH20 Pressure Support:  [10 cmH20] 10 cmH20 Plateau Pressure:  [27 cmH20-30 cmH20] 27 cmH20   Intake/Output from previous day: 06/29 0701 - 06/30 0700 In: 2245.1 [I.V.:952.4; NG/GT:392.7; IV Piggyback:900] Out: 2995 [Urine:2925; Chest Tube:70]  Intake/Output this shift: Total I/O In: 464.4 [I.V.:194.4; NG/GT:170; IV Piggyback:100] Out: 825 [Urine:825]  Current Meds: Scheduled Meds: . acetaminophen  1,000 mg Oral Q6H   Or  . acetaminophen (TYLENOL) oral liquid 160 mg/5 mL  1,000 mg Oral Q6H  . antiseptic oral rinse  7 mL Mouth Rinse QID  . baclofen  5 mg Per Tube Q8H  . bisacodyl  10 mg Oral Daily  . chlorhexidine gluconate (SAGE KIT)  15 mL Mouth Rinse BID  . famotidine  20 mg Per Tube BID  . feeding supplement (PRO-STAT SUGAR FREE 64)  60 mL Per Tube TID  . feeding  supplement (VITAL HIGH PROTEIN)  1,000 mL Per Tube Q24H  . fluconazole (DIFLUCAN) IV  100 mg Intravenous Q24H  . furosemide  20 mg Intravenous Daily  . gabapentin  300 mg Per Tube Q8H  . ipratropium-albuterol  3 mL Nebulization Q6H  . levothyroxine  50 mcg Oral QAC breakfast  . methadone  120 mg Oral Daily  . piperacillin-tazobactam (ZOSYN)  IV  3.375 g Intravenous Q8H  . QUEtiapine  150 mg Per Tube Q12H  . sennosides  5 mL Per Tube BID  . vancomycin  1,000 mg Intravenous Q8H   Continuous Infusions: . fentaNYL infusion INTRAVENOUS 250 mcg/hr (11/29/15 0800)  . propofol (DIPRIVAN) infusion 40 mcg/kg/min (11/29/15 0912)   PRN Meds:.fentaNYL, midazolam, ondansetron (ZOFRAN) IV, potassium chloride  General appearance: sedated Heart: regular rate and rhythm Lungs: clear anteriorly Abdomen: soft Extremities: + LE edema Wound: dressings intact  Lab Results: CBC: Recent Labs  11/28/15 0520 11/29/15 0400  WBC 9.1 8.7  HGB 9.1* 9.4*  HCT 29.5* 30.5*  PLT 331 381   BMET:  Recent Labs  11/28/15 0520 11/29/15 0400  NA 141 140  K 3.9 3.3*  CL 103 99*  CO2 31 33*  GLUCOSE 95 118*  BUN <5* 10  CREATININE 0.53 0.54  CALCIUM 7.8* 8.2*    ABG    Component Value Date/Time   PHART 7.414 11/27/2015 0445   PCO2ART 49.9* 11/27/2015 0445   PO2ART 101* 11/27/2015 0445   HCO3 31.3* 11/27/2015 0445   TCO2 32.8 11/27/2015 0445   ACIDBASEDEF 4.0* 08/26/2012 1613   O2SAT 97.6 11/27/2015 0445       Recent Results (from the past 720 hour(s))  MRSA PCR Screening     Status: None   Collection Time: 11/25/15 12:38 PM  Result Value Ref Range Status   MRSA by PCR NEGATIVE NEGATIVE Final    Comment:        The GeneXpert MRSA Assay (FDA approved for NASAL specimens only), is one component of a comprehensive MRSA colonization surveillance program. It is not intended to diagnose MRSA infection nor to guide or monitor treatment for MRSA infections.   Culture, blood (routine x 2)      Status: None (Preliminary result)   Collection Time: 11/25/15  1:40 PM  Result Value Ref Range Status   Specimen Description BLOOD LEFT HAND  Final   Special Requests IN PEDIATRIC BOTTLE 0.5 CC  Final   Culture NO GROWTH 3 DAYS  Final   Report Status PENDING  Incomplete  Culture, blood (routine x 2)     Status: None (Preliminary result)   Collection Time: 11/25/15  1:52 PM  Result Value Ref Range Status   Specimen Description BLOOD RIGHT HAND  Final   Special Requests IN PEDIATRIC BOTTLE 0.5 CC  Final   Culture NO GROWTH 3 DAYS  Final   Report Status PENDING  Incomplete  Culture, Urine     Status: None   Collection Time: 11/25/15  3:28 PM  Result Value Ref Range Status   Specimen Description URINE, CATHETERIZED  Final   Special Requests NONE  Final   Culture NO GROWTH  Final   Report Status 11/26/2015 FINAL  Final  Surgical pcr screen     Status: None   Collection Time: 11/26/15  1:09 AM  Result Value Ref Range Status   MRSA, PCR NEGATIVE NEGATIVE Final   Staphylococcus aureus NEGATIVE NEGATIVE Final    Comment:        The Xpert SA Assay (FDA approved for NASAL specimens in patients over 40 years of age), is one component of a comprehensive surveillance program.  Test performance has been validated by [facility] for patients greater than or equal to 49 year old. It is not intended to diagnose infection nor to guide or monitor treatment.   Body fluid culture     Status: None (Preliminary result)   Collection Time: 11/26/15 11:18 AM  Result Value Ref Range Status   Specimen Description FLUID RIGHT LOWER PLEURAL  Final   Special Requests RIGHT LOWER LOBE FLUID  Final   Gram Stain   Final    ABUNDANT WBC PRESENT,BOTH PMN AND MONONUCLEAR NO ORGANISMS SEEN    Culture   Final    RARE YEAST CRITICAL RESULT CALLED TO, READ BACK BY AND VERIFIED WITH: T SHELTON,RN AT 1238 11/28/15 BY L BENFIELD    Report Status PENDING  Incomplete  Body fluid culture     Status: None  (Preliminary result)   Collection Time: 11/26/15 12:07 PM  Result Value Ref Range Status   Specimen Description FLUID RIGHT PLEURAL  Final   Special Requests POF ZINACEF, ZITHROMAX,  ROCEPHIN,VANCOMYCIN  Final   Gram Stain   Final    FEW WBC PRESENT,BOTH PMN AND MONONUCLEAR NO ORGANISMS SEEN    Culture NO GROWTH 3 DAYS  Final   Report Status PENDING  Incomplete  Anaerobic culture     Status: None (Preliminary result)   Collection Time: 11/26/15 12:07 PM  Result Value Ref Range Status   Specimen Description FLUID RIGHT PLEURAL  Final   Special Requests POF ZITHROMAX, ZINACEF, ROCEPHIN, VANCOMYCIN  Final   Gram Stain   Final    FEW WBC PRESENT,BOTH PMN AND MONONUCLEAR NO ORGANISMS SEEN    Culture   Final    NO ANAEROBES ISOLATED; CULTURE IN PROGRESS FOR 5 DAYS   Report Status PENDING  Incomplete  Aerobic/Anaerobic Culture (surgical/deep wound)     Status: None (Preliminary result)   Collection Time: 11/26/15 12:10 PM  Result Value Ref Range Status   Specimen Description WOUND RIGHT PLEURAL  Final   Special Requests   Final    SWABS OF PLEURAL CAVITY POF VANCOMYCIN, ROCEPHIN, ZINACEF, ZITHROMAX   Gram Stain   Final    RARE WBC PRESENT,BOTH PMN AND MONONUCLEAR NO ORGANISMS SEEN    Culture   Final    NO GROWTH 2 DAYS NO ANAEROBES ISOLATED; CULTURE IN PROGRESS FOR 5 DAYS   Report Status PENDING  Incomplete  Aerobic/Anaerobic Culture (surgical/deep wound)     Status: None (Preliminary result)   Collection Time: 11/26/15 12:41 PM  Result Value Ref Range Status   Specimen Description TISSUE RIGHT LOWER LUNG  Final   Special Requests   Final    PLEU PEEL  POF ZINACEF, ZITHROMAX, ROCEPHIN, VANCOMYCIN   Gram Stain   Final    ABUNDANT WBC PRESENT, PREDOMINANTLY PMN NO ORGANISMS SEEN    Culture   Final    NO GROWTH 2 DAYS NO ANAEROBES ISOLATED; CULTURE IN PROGRESS FOR 5 DAYS   Report Status PENDING  Incomplete  Fungus Culture With Stain (Not @ [facility])     Status: None (Preliminary  result)   Collection Time: 11/26/15 12:41 PM  Result Value Ref Range Status   Fungus Stain Final report  Final    Comment: (NOTE) Performed At: [facility] [place], [place] [id number] Lindon Romp MD [id number]    Fungus (Mycology) Culture PENDING  Incomplete   Fungal Source TISSUE  Final    Comment: RIGHT PLEURAL POF ZINACEF, ZITHROMAX, ROCEPHIN, VANCOMYCIN   Acid Fast Smear (AFB)     Status: None   Collection Time: 11/26/15 12:41 PM  Result Value Ref Range Status   AFB Specimen Processing Comment  Final    Comment: Tissue Grinding and Digestion/Decontamination   Acid Fast Smear Negative  Final    Comment: (NOTE) Performed At: [facility] [address] [place], [place] [id number] Lindon Romp MD [id number]    Source (AFB) TISSUE  Final    Comment: RIGHT PLEURAL POF ZINACEF, ZITHROMAX, ROCEPHIN, VANCOMYCIN   Fungus Culture Result     Status: None   Collection Time: 11/26/15 12:41 PM  Result Value Ref Range Status   Result 1 Comment  Final    Comment: (NOTE) KOH/Calcofluor preparation:  no fungus observed. Performed At: [facility] [place], [place] [id number] Lindon Romp MD [id number]       PT/INR: No results for input(s): LABPROT, INR in the last 72 hours. Radiology: Dg Chest Port 1 View  11/29/2015  CLINICAL DATA:  Pneumothorax. EXAM: PORTABLE CHEST  1 VIEW COMPARISON:  11/28/2015.  CT 11/23/2015. FINDINGS: Endotracheal tube and NG tube in stable position. Right IJ line stable position. Right chest tubes in stable position. No pneumothorax. Heart size stable.Persistent right perihilar infiltrate and/or mass. Diffuse bilateral airspace disease again noted. No pleural effusion. Scratched IMPRESSION: 1. Lines and tubes including right chest tubes in stable position. No pneumothorax . 2.  Hurts persistent right perihilar infiltrate and/or mass. 3.  Persistent bilateral airspace disease.  Electronically Signed   By: Marcello Moores  Register   On: 11/29/2015 07:17     Assessment/Plan: S/P Procedure(s) (LRB): VIDEO ASSISTED THORACOSCOPY (VATS)/EMPYEMA (Right) VIDEO BRONCHOSCOPY (N/A)  1 not much CT drainage- remove anterior, Bard chest tube 2 cont abs, cultures neg so far 3 vent wean/medical management per CCM   Kennth Vanbenschoten E 11/29/2015 12:54 PM

## 2015-11-29 NOTE — Progress Notes (Signed)
FPTS Social Note  S: Pt remains intubated this morning. Still with chest tubes x 3 to suction. Pt is sleeping this morning.  O: BP 99/57 mmHg  Pulse 85  Temp(Src) 97.4 F (36.3 C) (Oral)  Resp 19  Ht 5\' 9"  (1.753 m)  Wt 216 lb 0.8 oz (98 kg)  BMI 31.89 kg/m2  SpO2 97%  LMP 11/23/2015  General: Sleeping comfortably  A/P: Intubated s/p VATS - Appreciate PCCM - Weaning vent - Chest tubes in place - Will continue to follow socially, happy to resume care when appropriate  Sela Hua, MD 11/29/2015, 7:28 AM PGY-1, [place] Medicine Service pager [phone]

## 2015-11-29 NOTE — Progress Notes (Signed)
PULMONARY / CRITICAL CARE MEDICINE   Name: Kylie [name] MRN: [id number] DOB: 07/13/1970    ADMISSION DATE:  11/23/2015 CONSULTATION DATE:  6/26  REFERRING MD:  Erin Hearing / FMTS  CHIEF COMPLAINT:  Progressive respiratory failure and abnormal CXR   HISTORY OF PRESENT ILLNESS:   45 y.o. female admitted 6/24 with cc: progressive shortness of breath. Reported it had been present for the last month, coming and going. Over the last two weeks and especially the last couple of days, dyspnea has gotten worse, and patient has had dry cough. She has had associated right UQ pain and right lower chest pain, worsened by cough. Cough has also caused loss of bladder control. She reported having subjective fever "a couple of times" over the last month. She denies sick contacts. She has not been hospitalized or had antibiotics within the last 3 months. She has not tried any medicines for her symptoms. Some nausea but no v/d. She was admitted to the IM service. Treated w/ working dx of CAP. PCCM asked to see on 6/26 when pt found confused and more hypoxic w/ sats in 40s on room air.   SUBJECTIVE: No acute events overnight. Failed pressure support wean this morning on pressure support 10/5 with FiO2 0.5.  REVIEW OF SYSTEMS:  Unable to obtain with intubation & sedation.  VITAL SIGNS: BP 92/56 mmHg  Pulse 82  Temp(Src) 97.5 F (36.4 C) (Oral)  Resp 18  Ht 5\' 9"  (1.753 m)  Wt 98 kg (216 lb 0.8 oz)  BMI 31.89 kg/m2  SpO2 96%  LMP 11/23/2015  HEMODYNAMICS:    VENTILATOR SETTINGS: Vent Mode:  [-] PRVC FiO2 (%):  [40 %-50 %] 40 % Set Rate:  [18 bmp] 18 bmp Vt Set:  [530 mL] 530 mL PEEP:  [5 cmH20] 5 cmH20 Pressure Support:  [10 cmH20] 10 cmH20 Plateau Pressure:  [27 cmH20-30 cmH20] 27 cmH20  INTAKE / OUTPUT: I/O last 3 completed shifts: In: 3463.7 [I.V.:1436; NG/GT:627.7; IV Piggyback:1400] Out: [id number] [Urine:4040; Chest Tube:145]  PHYSICAL EXAMINATION: General: Eyes open. No distress. Boyfriend  and his parents at bedside. Neuro:  On sedation. Moving all 4 extremities spontaneously. Following commands. Nods to questions. HEENT:  No scleral injection . Endotracheal tube in place. Cardiovascular:  Regular rate & rhythm. No edema or appreciable JVD. Lungs:  Coarse breath sounds bilaterally unchanged with coarse wheeze. Symmetric chest wall rise on ventilator.  Musculoskeletal:   No joint deformity or effusion.  Skin:  Warm and dry. No rash on exposed skin.   LABS:  BMET  Recent Labs Lab 11/27/15 0320 11/28/15 0520 11/29/15 0400  NA 139 141 140  K 4.0 3.9 3.3*  CL 101 103 99*  CO2 27 31 33*  BUN 8 <5* 10  CREATININE 0.65 0.53 0.54  GLUCOSE 147* 95 118*    Electrolytes  Recent Labs Lab 11/27/15 0320 11/28/15 0520 11/29/15 0400  CALCIUM 7.6* 7.8* 8.2*  MG 2.2 2.2 2.1  PHOS 2.5 3.3 3.8    CBC  Recent Labs Lab 11/27/15 0320 11/28/15 0520 11/29/15 0400  WBC 15.9* 9.1 8.7  HGB 9.8* 9.1* 9.4*  HCT 31.6* 29.5* 30.5*  PLT 331 331 381    Coag's  Recent Labs Lab 11/25/15 1824  APTT 26  INR 1.37    Sepsis Markers No results for input(s): LATICACIDVEN, PROCALCITON, O2SATVEN in the last 168 hours.  ABG  Recent Labs Lab 11/26/15 0055 11/26/15 1448 11/27/15 0445  PHART 7.366 7.383 7.414  PCO2ART 58.2* 55.9* 49.9*  PO2ART 99.2 151.0* 101*    Liver Enzymes  Recent Labs Lab 11/23/15 1551 11/25/15 1824 11/28/15 0520 11/29/15 0400  AST 17 28 28   --   ALT 8* 19 24  --   ALKPHOS 94 119 103  --   BILITOT 0.4 <0.1* 0.2*  --   ALBUMIN 3.3* 2.3* 1.5* 1.5*    Cardiac Enzymes  Recent Labs Lab 11/24/15 0439  TROPONINI <0.03    Glucose  Recent Labs Lab 11/28/15 0742 11/28/15 1956 11/29/15 0004 11/29/15 0326 11/29/15 0751 11/29/15 1120  GLUCAP 137* 107* 97 123* 120* 116*    Imaging Dg Chest Port 1 View  11/29/2015  CLINICAL DATA:  Pneumothorax. EXAM: PORTABLE CHEST 1 VIEW COMPARISON:  11/28/2015.  CT 11/23/2015. FINDINGS:  Endotracheal tube and NG tube in stable position. Right IJ line stable position. Right chest tubes in stable position. No pneumothorax. Heart size stable.Persistent right perihilar infiltrate and/or mass. Diffuse bilateral airspace disease again noted. No pleural effusion. Scratched IMPRESSION: 1. Lines and tubes including right chest tubes in stable position. No pneumothorax . 2.  Hurts persistent right perihilar infiltrate and/or mass. 3.  Persistent bilateral airspace disease. Electronically Signed   By: Marcello Moores  Register   On: 11/29/2015 07:17    STUDIES:  CT Chest 6/24: 1. Consolidated lung in the RIGHT lower lobe with differential including pneumonia versus neoplasm. 2. Peribronchial and perihilar thickening on the RIGHT consistent with infection or neoplasm. 3. Mild mediastinal adenopathy differential including metastatic adenopathy versus reactive adenopathy 4. Partially loculated pleural fluid in the lower RIGHT thorax. Abd X-ray 6/27:  NGT w/ tip in proximal to mid stomach. Port CXR 6/28: Endotracheal tube approximately 4.8 cm above carina. Bilateral opacities improving. Right chest tube in good position. Right internal jugular central venous catheter in good position. Enteric feeding tube coursing below diaphragm with side-port below what appears to be esophageal sphincter. Port CXR 6/29: Endotracheal tube and central venous catheter in good position. Some improvement in left basilar opacity. Persistent right lung opacities. Right chest tubes in place. EKG 6/29:  QTc 457ms. Port CXR 6/30:  ETT 5.5cm above carina. Bilateral opacities persist. EKG 6/30:  QTc 481ms  MICROBIOLOGY: MRSA PCR 6/27 & 6/26:  Negative  Urine Ctx 6/26:  Negative Urine Strep Ag 6/26:  Negative Blood Ctx x2 6/26>>>  HIV 6/26:  Nonreactive  R Pleural Fluid 6/27>>> R Pleural Anaerobic Ctx 6/27>>> R Pleural Peel Routine, AFB & Fungal Ctx 6/27>>>  ANTIBIOTICS: Azithromycin 6/24 - 6/27 Rocephin 6/24 -  6/27 Cefuroxime 6/27 (surgical ppx) Vancomycin 6/26 - 6/30 Zosyn 6/27>>>  SIGNIFICANT EVENTS: 6/26 - Admitted to 39M for worsening hypoxia and altered ental status 6/27 - OR for R VATS  LINES/TUBES: L RAD ART LINE 6/27 - 6/28 OETT 7.5 6/27>>> R CHEST TUBE S/P VATS 6/27>>> FOLEY 6/27>>> OGT 6/27>>> R IJ CVL 6/27>>> PIV X1  ASSESSMENT / PLAN:  PULMONARY A: Acute Hypoxic Respiratory Failure ARDS Severe CAP Right Complicated Parapneumonic Effusion - S/P VATS 6/27. CT in place. H/O VCD  P:   Full Vent Support Daily SBT & Sedation Vacation Chest Tube mgt per CVTS Lasix IV daily Duoneb q6hr Advance ETT 2cm  CARDIOVASCULAR A:  No acute issues.  P:  Monitor on telemetry Vitals per unit protocol EKG daily to monitor QTc on Methadone & Seroquel  RENAL A:   Hypokalemia - Replacing.  P:   Trending UOP with Foley Monitoring electrolytes & renal function daily  Replacing electrolytes as indicated KCl 38mEq IV x3 runs  GASTROINTESTINAL A:   Nutrition Abdominal Pain - Resolved. Likely due to CAP.  P:  NPO Tube Feedings per nutrition Change to Pepcid VT q12hr Dulcolax VT daily Senna VT bid  HEMATOLOGIC A:   Anemia - Mild & stable. No signs of active bleeding. Leukocytosis - Resolved. Secondary to sepsis.  P:  Trending cell counts daily w/ CBC Transfuse for Hgb <7.0 or active bleeding SCDs Starting Lovenox [facility] daily  INFECTIOUS A:   Severe CAP w/ Complicated Parapneumonic Effusion H/O Hepatitis B Infection  P:   Empiric Zosyn Day #4 D/C Vancomycin Awaiting finalization of cultures  ENDOCRINE A:   H/O Hypothyroidism   P:   Synthroid VT daily Accu-Checks q4hr w/ notification parameters for MD  NEUROLOGIC A:   Sedation on Ventilator H/O Anxiety H/o Bipolar Disease Chronic Pain  P:   RASS goal: 0 to -1 Fentanyl gtt & IV prn Propofol gtt  Versed IV prn Neurontin 300mg  VT q8hr (home dose 300mg  tid) Continue Methadone 120mg  daily (home  dose) Continue Seroquel 150 BID (home dose XR 300mg ) Baclofen 5mg  VT q8hr (home dose 10mg  tid) Hold Vistaril & Lithium  FAMILY  - Updates: Boyfriend updated at bedside 6/30 by Dr. Ashok Cordia.  - Inter-disciplinary family meet or Palliative Care meeting due by:  7/2  TODAY'S SUMMARY:  45 year old female acute hypoxic respiratory failure from severe community acquired pneumonia and complicated right parapneumonic effusion. Status post VATS. Continuing current doses of Seroquel, Neurontin, methadone, and baclofen. Continuing pressure support wean and gentle diuresis with daily IV Lasix. Discontinuing vancomycin given negative cultures to date but continuing Zosyn.  I have spent a total of 34 minutes of critical care time today caring for the patient and reviewing the patient's electronic medical record.  Sonia Baller Ashok Cordia, M.D. [facility] Pager:  [phone] After 3pm or if no response, call [phone] 11/29/2015 12:56 PM

## 2015-11-29 NOTE — Progress Notes (Signed)
Advanced ETT 2 cm per MD order, however, patient began coughing excessively, throwing up, and breathing asymmetrically. Pulled tube back 1/2 cm and patient seemed to tolerate that better. ETT secured at 23cm. MD notified.

## 2015-11-30 ENCOUNTER — Inpatient Hospital Stay (HOSPITAL_COMMUNITY): Payer: Medicaid Other

## 2015-11-30 LAB — RENAL FUNCTION PANEL
ALBUMIN: 1.6 g/dL — AB (ref 3.5–5.0)
ANION GAP: 5 (ref 5–15)
BUN: 15 mg/dL (ref 6–20)
CO2: 36 mmol/L — ABNORMAL HIGH (ref 22–32)
Calcium: 8.3 mg/dL — ABNORMAL LOW (ref 8.9–10.3)
Chloride: 102 mmol/L (ref 101–111)
Creatinine, Ser: 0.54 mg/dL (ref 0.44–1.00)
Glucose, Bld: 100 mg/dL — ABNORMAL HIGH (ref 65–99)
PHOSPHORUS: 4 mg/dL (ref 2.5–4.6)
POTASSIUM: 3.5 mmol/L (ref 3.5–5.1)
Sodium: 143 mmol/L (ref 135–145)

## 2015-11-30 LAB — BODY FLUID CULTURE: Culture: NO GROWTH

## 2015-11-30 LAB — GLUCOSE, CAPILLARY
GLUCOSE-CAPILLARY: 128 mg/dL — AB (ref 65–99)
GLUCOSE-CAPILLARY: 105 mg/dL — AB (ref 65–99)
GLUCOSE-CAPILLARY: 142 mg/dL — AB (ref 65–99)
Glucose-Capillary: 141 mg/dL — ABNORMAL HIGH (ref 65–99)
Glucose-Capillary: 114 mg/dL — ABNORMAL HIGH (ref 65–99)

## 2015-11-30 LAB — CULTURE, BLOOD (ROUTINE X 2)
CULTURE: NO GROWTH
Culture: NO GROWTH

## 2015-11-30 LAB — CBC WITH DIFFERENTIAL/PLATELET
BASOS ABS: 0 10*3/uL (ref 0.0–0.1)
Basophils Relative: 0 %
Eosinophils Absolute: 0.5 10*3/uL (ref 0.0–0.7)
Eosinophils Relative: 5 %
HEMATOCRIT: 31.3 % — AB (ref 36.0–46.0)
HEMOGLOBIN: 9.5 g/dL — AB (ref 12.0–15.0)
LYMPHS ABS: 2 10*3/uL (ref 0.7–4.0)
LYMPHS PCT: 22 %
MCH: 28.7 pg (ref 26.0–34.0)
MCHC: 30.4 g/dL (ref 30.0–36.0)
MCV: 94.6 fL (ref 78.0–100.0)
Monocytes Absolute: 0.5 10*3/uL (ref 0.1–1.0)
Monocytes Relative: 6 %
NEUTROS ABS: 6 10*3/uL (ref 1.7–7.7)
Neutrophils Relative %: 67 %
Platelets: 386 10*3/uL (ref 150–400)
RBC: 3.31 MIL/uL — AB (ref 3.87–5.11)
RDW: 15.1 % (ref 11.5–15.5)
WBC: 8.9 10*3/uL (ref 4.0–10.5)

## 2015-11-30 LAB — TRIGLYCERIDES: TRIGLYCERIDES: 208 mg/dL — AB (ref <150)

## 2015-11-30 LAB — MAGNESIUM: Magnesium: 2.2 mg/dL (ref 1.7–2.4)

## 2015-11-30 MED ORDER — POTASSIUM CHLORIDE 20 MEQ/15ML (10%) PO SOLN
40.0000 meq | Freq: Once | ORAL | Status: AC
  Administered 2015-11-30: 40 meq
  Filled 2015-11-30: qty 30

## 2015-11-30 NOTE — Progress Notes (Signed)
PULMONARY / CRITICAL CARE MEDICINE   Name: Kylie [name] MRN: [id number] DOB: 04/30/1971    ADMISSION DATE:  11/23/2015 CONSULTATION DATE:  6/26 LOS 7 days  REFERRING MD:  Erin Hearing / FMTS  CHIEF COMPLAINT:  Progressive respiratory failure and abnormal CXR   BRIEF 45 y.o. female admitted 6/24 with cc: progressive shortness of breath. Reported it had been present for the last month, coming and going. Over the last two weeks and especially the last couple of days, dyspnea has gotten worse, and patient has had dry cough. She has had associated right UQ pain and right lower chest pain, worsened by cough. Cough has also caused loss of bladder control. She reported having subjective fever "a couple of times" over the last month. She denies sick contacts. She has not been hospitalized or had antibiotics within the last 3 months. She has not tried any medicines for her symptoms. Some nausea but no v/d. She was admitted to the IM service. Treated w/ working dx of CAP. PCCM asked to see on 6/26 when pt found confused and more hypoxic w/ sats in 40s on room air.     MICROBIOLOGY: MRSA PCR 6/27 & 6/26:  Negative  Urine Ctx 6/26:  Negative Urine Strep Ag 6/26:  Negative Blood Ctx x2 6/26>>>  HIV 6/26:  Nonreactive  R Pleural Fluid 6/27>>> R Pleural Anaerobic Ctx 6/27>>> R Pleural Peel Routine, AFB & Fungal Ctx 6/27>>>  ANTIBIOTICS: Azithromycin 6/24 - 6/27 Rocephin 6/24 - 6/27 Cefuroxime 6/27 (surgical ppx) Vancomycin 6/26 - 6/30 Zosyn 6/27>>>  LINES/TUBES: L RAD ART LINE 6/27 - 6/28 OETT 7.5 6/27>>> R CHEST TUBE S/P VATS 6/27>>> FOLEY 6/27>>> OGT 6/27>>> R IJ CVL 6/27>>> PIV X1   SIGNIFICANT EVENTS: 6/26 - Admitted to 28M for worsening hypoxia and altered ental status CT Chest 6/24: 1. Consolidated lung in the RIGHT lower lobe with differential including pneumonia versus neoplasm. 2. Peribronchial and perihilar thickening on the RIGHT consistent with infection or neoplasm. 3. Mild  mediastinal adenopathy differential including metastatic adenopathy versus reactive adenopathy 4. Partially loculated pleural fluid in the lower RIGHT thorax 6/27 - OR for R VATS. in place. EKG 6/29:  QTc 479ms.t. EKG 6/30:  QTc 44ms.  No acute events overnight. Failed pressure support wean this morning on pressure support 10/5 with FiO2 0.5.    SUBJECTIVE/OVERNIGHT/INTERVAL HX 11/30/15 - failed SBT with significant resp distress. 1 chest tube left - seen by CVTS 11/30/2015     VITAL SIGNS: BP 111/72 mmHg  Pulse 85  Temp(Src) 98.7 F (37.1 C) (Oral)  Resp 20  Ht 5\' 9"  (1.753 m)  Wt 96.8 kg (213 lb 6.5 oz)  BMI 31.50 kg/m2  SpO2 98%  LMP 11/23/2015  HEMODYNAMICS:    VENTILATOR SETTINGS: Vent Mode:  [-] PSV FiO2 (%):  [40 %-50 %] 40 % Set Rate:  [18 bmp] 18 bmp Vt Set:  [530 mL] 530 mL PEEP:  [5 cmH20] 5 cmH20 Pressure Support:  [10 cmH20-12 cmH20] 12 cmH20 Plateau Pressure:  [28 cmH20-30 cmH20] 29 cmH20  INTAKE / OUTPUT: I/O last 3 completed shifts: In: 3539.7 [I.V.:1637; NG/GT:1102.7; IV Piggyback:800] Out: 2530 [Urine:2530]  PHYSICAL EXAMINATION: General: looks critically ill Neuro:  On sedation. Moving all 4 extremities spontaneously. Following commands. Nods to questions. HEENT:  No scleral injection . Endotracheal tube in place. Cardiovascular:  Regular rate & rhythm. No edema or appreciable JVD. Lungs:  Coarse breath sounds bilaterally unchanged with coarse wheeze. Symmetric chest wall rise on ventilator.  Musculoskeletal:  No joint deformity or effusion.  Skin:  Warm and dry. No rash on exposed skin.   LABS:  PULMONARY  Recent Labs Lab 11/25/15 1143 11/25/15 1810 11/26/15 0055 11/26/15 1448 11/27/15 0445  PHART 7.347* 7.351 7.366 7.383 7.414  PCO2ART 47.9* 56.3* 58.2* 55.9* 49.9*  PO2ART 59.6* 115.0* 99.2 151.0* 101*  HCO3 25.6* 31.2* 32.5* 33.3* 31.3*  TCO2 27.1 33 34.3 35 32.8  O2SAT 88.5 98.0 97.2 99.0 97.6    CBC  Recent Labs Lab  11/28/15 0520 11/29/15 0400 11/30/15 0445  HGB 9.1* 9.4* 9.5*  HCT 29.5* 30.5* 31.3*  WBC 9.1 8.7 8.9  PLT 331 381 386    COAGULATION  Recent Labs Lab 11/25/15 1824  INR 1.37    CARDIAC   Recent Labs Lab 11/24/15 0439  TROPONINI <0.03   No results for input(s): PROBNP in the last 168 hours.   CHEMISTRY  Recent Labs Lab 11/26/15 0252 11/27/15 0320 11/28/15 0520 11/29/15 0400 11/30/15 0445  NA 142 139 141 140 143  K 3.6 4.0 3.9 3.3* 3.5  CL 103 101 103 99* 102  CO2 30 27 31  33* 36*  GLUCOSE 120* 147* 95 118* 100*  BUN 6 8 <5* 10 15  CREATININE 0.69 0.65 0.53 0.54 0.54  CALCIUM 8.6* 7.6* 7.8* 8.2* 8.3*  MG  --  2.2 2.2 2.1 2.2  PHOS  --  2.5 3.3 3.8 4.0   Estimated Creatinine Clearance: 112.2 mL/min (by C-G formula based on Cr of 0.54).   LIVER  Recent Labs Lab 11/23/15 1551 11/25/15 1824 11/28/15 0520 11/29/15 0400 11/30/15 0445  AST 17 28 28   --   --   ALT 8* 19 24  --   --   ALKPHOS 94 119 103  --   --   BILITOT 0.4 <0.1* 0.2*  --   --   PROT 7.6 6.5 5.5*  --   --   ALBUMIN 3.3* 2.3* 1.5* 1.5* 1.6*  INR  --  1.37  --   --   --      INFECTIOUS No results for input(s): LATICACIDVEN, PROCALCITON in the last 168 hours.   ENDOCRINE CBG (last 3)   Recent Labs  11/30/15 0336 11/30/15 0759 11/30/15 1157  GLUCAP 128* 141* 105*         IMAGING x48h  - image(s) personally visualized  -   highlighted in bold Dg Chest Port 1 View  11/30/2015  CLINICAL DATA:  eval changes with empyema right side EXAM: PORTABLE CHEST - 1 VIEW COMPARISON:  the previous day's study FINDINGS: Endotracheal tube, nasogastric tube, and right IJ central line stable. One of 2 right chest tubes has been removed. The basilar tube remains in place. No pneumothorax evident.No definite pleural effusion. Moderate airspace opacities throughout both lungs involving bases more than apices, slightly increased. Heart size and mediastinal contours are within normal limits.  Visualized bones unremarkable. IMPRESSION: 1. Removal of 1 of the 2 right chest tubes without pneumothorax. 2. Some increase in bilateral asymmetric airspace opacities. 3. Remainder Support hardware stable in position. Electronically Signed   By: Lucrezia Europe M.D.   On: 11/30/2015 10:19   Dg Chest Port 1 View  11/29/2015  CLINICAL DATA:  Pneumothorax. EXAM: PORTABLE CHEST 1 VIEW COMPARISON:  11/28/2015.  CT 11/23/2015. FINDINGS: Endotracheal tube and NG tube in stable position. Right IJ line stable position. Right chest tubes in stable position. No pneumothorax. Heart size stable.Persistent right perihilar infiltrate and/or mass. Diffuse bilateral airspace disease again  noted. No pleural effusion. Scratched IMPRESSION: 1. Lines and tubes including right chest tubes in stable position. No pneumothorax . 2.  Hurts persistent right perihilar infiltrate and/or mass. 3.  Persistent bilateral airspace disease. Electronically Signed   By: Marcello Moores  Register   On: 11/29/2015 07:17        ASSESSMENT / PLAN:  PULMONARY A: Acute Hypoxic Respiratory Failure ARDS Severe CAP Right Complicated Parapneumonic Effusion - S/P VATS 6/27. CT in place. H/O VCD  - 11/30/15 - failed SBT. ET tube high. Significant infiltrates +. ALI on CXR  P:   Advance ET tube Full Vent Support Daily SBT as tolerated Chest Tube mgt per CVTS Lasix IV daily Duoneb q6hr   CARDIOVASCULAR A:  No acute issues. Echo 6/36/17 - ef 65% with gr 1 dias dysfn  7/1 - QTc 452msec  P:  Monitor on telemetry EKG daily to monitor QTc on Methadone & Seroquel Continue daily lasix   RENAL A:   Hypokalemia - Replacing.  P:   Trending UOP with Foley Monitoring electrolytes & renal function daily  Replacing electrolytes as indicated KCl 44mEq IV x3 runs  GASTROINTESTINAL A:   Nutrition Abdominal Pain - Resolved. Likely due to CAP.  P:  NPO Tube Feedings per nutrition Change to Pepcid VT q12hr Dulcolax VT daily Senna VT  bid  HEMATOLOGIC A:   Anemia - Mild & stable. No signs of active bleeding. Leukocytosis - Resolved. Secondary to sepsis.  P:  Trending cell counts daily w/ CBC Transfuse for Hgb <7.0 or active bleeding SCDs Starting Lovenox [place] daily  INFECTIOUS A:   Severe CAP w/ Complicated Parapneumonic Effusion H/O Hepatitis B Infection  P:   Empiric Zosyn Day #4 D/C Vancomycin Awaiting finalization of cultures  ENDOCRINE A:   H/O Hypothyroidism   P:   Synthroid VT daily Accu-Checks q4hr w/ notification parameters for MD  NEUROLOGIC A:   Sedation on Ventilator H/O Anxiety H/o Bipolar Disease Chronic Pain  - agitation also precluding wean 11/30/15  P:   RASS goal: 0 to -1 Fentanyl gtt & IV prn Propofol gtt  - check CK and lactate 12/01/15 Versed IV prn Neurontin 300mg  VT q8hr (home dose 300mg  tid) Continue Methadone 120mg  daily (home dose) Continue Seroquel 150 BID (home dose XR 300mg ) Baclofen 5mg  VT q8hr (home dose 10mg  tid) Hold Vistaril & Lithium  FAMILY  - Updates: Boyfriend updated at bedside 6/30 by Dr. Ashok Cordia. No family at bedside 71/1/7  - Inter-disciplinary family meet or Palliative Care meeting due by:  7/2  TODAY'S SUMMARY:  45 year old female acute hypoxic respiratory failure from severe community acquired pneumonia and complicated right parapneumonic effusion. Status post VATS. Continuing current doses of Seroquel, Neurontin, methadone, and baclofen. Continuing pressure support wean and gentle diuresis with daily IV Lasix. continuing Zosyn. ALi and Agitated delirium and chronic CNS med dependency - predicts high prob for trach/ltac at los 7 days      The patient is critically ill with multiple organ systems failure and requires high complexity decision making for assessment and support, frequent evaluation and titration of therapies, application of advanced monitoring technologies and extensive interpretation of multiple databases.   Critical Care Time  devoted to patient care services described in this note is  40  Minutes. This time reflects time of care of this signee Dr Brand Males. This critical care time does not reflect procedure time, or teaching time or supervisory time of PA/NP/Med student/Med Resident etc but could involve care discussion time  Dr. Brand Males, M.D., [facility].C.P Pulmonary and Critical Care Medicine Staff Physician [facility] Pager: [phone], If no answer or between  15:00h - 7:00h: call [phone]  11/30/2015 2:36 PM

## 2015-11-30 NOTE — Progress Notes (Signed)
Pharmacy Antibiotic Note  Kylie [name] is a 45 y.o. female admitted on 11/23/2015 with pneumonia and empyema.    Plan: Consider d/c Zosyn 3.375g IV q8h with cx neg Or Consider 1 week duration  Height: 5\' 9"  (175.3 cm) Weight: 213 lb 6.5 oz (96.8 kg) IBW/kg (Calculated) : 66.2  Temp (24hrs), Avg:98.3 F (36.8 C), Min:97.5 F (36.4 C), Max:99.8 F (37.7 C)   Recent Labs Lab 11/26/15 0252 11/27/15 0320 11/27/15 1420 11/28/15 0520 11/29/15 0400 11/30/15 0445  WBC 21.4* 15.9*  --  9.1 8.7 8.9  CREATININE 0.69 0.65  --  0.53 0.54 0.54  VANCOTROUGH  --   --  12*  --   --   --     Estimated Creatinine Clearance: 112.2 mL/min (by C-G formula based on Cr of 0.54).    Allergies  Allergen Reactions  . Pregabalin Anaphylaxis  . Hydrocodone Itching and Rash  . Citalopram     Worsened anxiety  . Ibuprofen     Stomach pain  . Naproxen Swelling    troat and fingers swelling   . Povidone-Iodine Itching and Rash    Antimicrobials this admission: CTX 6/24 >> 6/27 Azith 6/25 >> 6/27 Vanc 6/26 >> 6/30 Zosyn 6/27 >>  Dose adjustments this admission: 6/28 VT = 12 (drawn 1 hr late) on 1gm IV Q8H  Microbiology results: 6/26 BCx2: NGTD 6/26 UCx: NEG 6/26 Pleural fluid - NGTD 6/26 MRSA PCR: neg  Levester Fresh, PharmD, BCPS, [facility] Clinical Pharmacist Pager [phone] 11/30/2015 11:02 AM

## 2015-11-30 NOTE — Progress Notes (Signed)
FMTS Social Note  The FMTS appreciates the care given to Kylie [name] by the CCM team and consulting services. She remains intubated. We will gladly accept her back to our team once she is suitable for transfer out of the ICU.   Algis Greenhouse. Jerline Pain, [place] Resident PGY-3 11/30/2015 11:05 AM

## 2015-11-30 NOTE — Progress Notes (Addendum)
TCTS DAILY ICU PROGRESS NOTE                   [address]            [place] [address]          [phone]   4 Days Post-Op Procedure(s) (LRB): VIDEO ASSISTED THORACOSCOPY (VATS)/EMPYEMA (Right) VIDEO BRONCHOSCOPY (N/A)  Total Length of Stay:  LOS: 7 days   Subjective: More alert  Objective: Vital signs in last 24 hours: Temp:  [97.5 F (36.4 C)-99.8 F (37.7 C)] 98.8 F (37.1 C) (07/01 0802) Pulse Rate:  [80-100] 88 (07/01 0800) Cardiac Rhythm:  [-] Normal sinus rhythm (07/01 0400) Resp:  [16-27] 19 (07/01 0800) BP: (90-115)/(53-72) 106/62 mmHg (07/01 0800) SpO2:  [87 %-100 %] 99 % (07/01 0800) FiO2 (%):  [40 %-50 %] 50 % (07/01 0400) Weight:  [213 lb 6.5 oz (96.8 kg)] 213 lb 6.5 oz (96.8 kg) (07/01 0400)  Filed Weights   11/28/15 0625 11/29/15 0251 11/30/15 0400  Weight: 217 lb 13 oz (98.8 kg) 216 lb 0.8 oz (98 kg) 213 lb 6.5 oz (96.8 kg)    Weight change: -2 lb 10.3 oz (-1.2 kg)   Hemodynamic parameters for last 24 hours:    Intake/Output from previous day: 06/30 0701 - 07/01 0700 In: 2030.6 [I.V.:1070.6; NG/GT:710; IV Piggyback:250] Out: 1880 [Urine:1880]  Intake/Output this shift:    Vent Mode:  [-] PRVC FiO2 (%):  [40 %-50 %] 50 % Set Rate:  [18 bmp] 18 bmp Vt Set:  [530 mL] 530 mL PEEP:  [5 cmH20] 5 cmH20 Plateau Pressure:  [27 cmH20-30 cmH20] 29 cmH20   Current Meds: Scheduled Meds: . acetaminophen  1,000 mg Oral Q6H   Or  . acetaminophen (TYLENOL) oral liquid 160 mg/5 mL  1,000 mg Oral Q6H  . antiseptic oral rinse  7 mL Mouth Rinse QID  . baclofen  5 mg Per Tube Q8H  . bisacodyl  10 mg Oral Daily  . chlorhexidine gluconate (SAGE KIT)  15 mL Mouth Rinse BID  . enoxaparin (LOVENOX) injection  40 mg Subcutaneous Q24H  . famotidine  20 mg Per Tube BID  . feeding supplement (PRO-STAT SUGAR FREE 64)  60 mL Per Tube TID  . feeding supplement (VITAL HIGH PROTEIN)  1,000 mL Per Tube Q24H  . fluconazole (DIFLUCAN) IV  100 mg  Intravenous Q24H  . furosemide  20 mg Intravenous Daily  . gabapentin  300 mg Per Tube Q8H  . ipratropium-albuterol  3 mL Nebulization Q6H  . levothyroxine  50 mcg Oral QAC breakfast  . methadone  120 mg Oral Daily  . piperacillin-tazobactam (ZOSYN)  IV  3.375 g Intravenous Q8H  . QUEtiapine  150 mg Per Tube Q12H  . sennosides  5 mL Per Tube BID   Continuous Infusions: . fentaNYL infusion INTRAVENOUS 250 mcg/hr (11/30/15 0600)  . propofol (DIPRIVAN) infusion 40 mcg/kg/min (11/30/15 5102)   PRN Meds:.fentaNYL, midazolam, ondansetron (ZOFRAN) IV, potassium chloride  General appearance: alert, cooperative and no distress Heart: regular rate and rhythm Lungs: dim in lower fields Abdomen: soft, nontender Extremities: no edema Wound: dressings intact  Lab Results: CBC: Recent Labs  11/29/15 0400 11/30/15 0445  WBC 8.7 8.9  HGB 9.4* 9.5*  HCT 30.5* 31.3*  PLT 381 386   BMET:  Recent Labs  11/29/15 0400 11/30/15 0445  NA 140 143  K 3.3* 3.5  CL 99* 102  CO2 33* 36*  GLUCOSE 118* 100*  BUN 10 15  CREATININE 0.54 0.54  CALCIUM 8.2* 8.3*    ABG    Component Value Date/Time   PHART 7.414 11/27/2015 0445   PCO2ART 49.9* 11/27/2015 0445   PO2ART 101* 11/27/2015 0445   HCO3 31.3* 11/27/2015 0445   TCO2 32.8 11/27/2015 0445   ACIDBASEDEF 4.0* 08/26/2012 1613   O2SAT 97.6 11/27/2015 0445     PT/INR: No results for input(s): LABPROT, INR in the last 72 hours. Radiology: No results found.   Assessment/Plan: S/P Procedure(s) (LRB): VIDEO ASSISTED THORACOSCOPY (VATS)/EMPYEMA (Right) VIDEO BRONCHOSCOPY (N/A)  1 no CXR today , will order 2 has 1 tube left, no air leak, no recent drainage, leave for now but hopefully d/c soon 3 vent wean/medical management per CCN 4 labs stable   GOLD,WAYNE E 11/30/2015 8:50 AM  Patient seen and examined, agree with above   Remo Lipps C. Roxan Hockey, MD [facility] [phone]

## 2015-12-01 ENCOUNTER — Inpatient Hospital Stay (HOSPITAL_COMMUNITY): Payer: Medicaid Other

## 2015-12-01 LAB — LACTIC ACID, PLASMA: LACTIC ACID, VENOUS: 0.7 mmol/L (ref 0.5–1.9)

## 2015-12-01 LAB — GLUCOSE, CAPILLARY
GLUCOSE-CAPILLARY: 117 mg/dL — AB (ref 65–99)
GLUCOSE-CAPILLARY: 123 mg/dL — AB (ref 65–99)
GLUCOSE-CAPILLARY: 174 mg/dL — AB (ref 65–99)
GLUCOSE-CAPILLARY: 116 mg/dL — AB (ref 65–99)
Glucose-Capillary: 110 mg/dL — ABNORMAL HIGH (ref 65–99)
Glucose-Capillary: 130 mg/dL — ABNORMAL HIGH (ref 65–99)
Glucose-Capillary: 137 mg/dL — ABNORMAL HIGH (ref 65–99)

## 2015-12-01 LAB — ANAEROBIC CULTURE

## 2015-12-01 LAB — CBC WITH DIFFERENTIAL/PLATELET
BASOS ABS: 0 10*3/uL (ref 0.0–0.1)
BASOS PCT: 0 %
EOS ABS: 0.4 10*3/uL (ref 0.0–0.7)
EOS PCT: 4 %
HEMATOCRIT: 31.5 % — AB (ref 36.0–46.0)
Hemoglobin: 9.5 g/dL — ABNORMAL LOW (ref 12.0–15.0)
Lymphocytes Relative: 25 %
Lymphs Abs: 2.3 10*3/uL (ref 0.7–4.0)
MCH: 28.1 pg (ref 26.0–34.0)
MCHC: 30.2 g/dL (ref 30.0–36.0)
MCV: 93.2 fL (ref 78.0–100.0)
MONO ABS: 0.7 10*3/uL (ref 0.1–1.0)
MONOS PCT: 8 %
Neutro Abs: 6 10*3/uL (ref 1.7–7.7)
Neutrophils Relative %: 63 %
PLATELETS: 410 10*3/uL — AB (ref 150–400)
RBC: 3.38 MIL/uL — ABNORMAL LOW (ref 3.87–5.11)
RDW: 15 % (ref 11.5–15.5)
WBC: 9.4 10*3/uL (ref 4.0–10.5)

## 2015-12-01 LAB — RENAL FUNCTION PANEL
ANION GAP: 10 (ref 5–15)
Albumin: 1.6 g/dL — ABNORMAL LOW (ref 3.5–5.0)
BUN: 19 mg/dL (ref 6–20)
CHLORIDE: 102 mmol/L (ref 101–111)
CO2: 32 mmol/L (ref 22–32)
Calcium: 8.5 mg/dL — ABNORMAL LOW (ref 8.9–10.3)
Creatinine, Ser: 0.52 mg/dL (ref 0.44–1.00)
GFR calc Af Amer: >60 mL/min (ref >60)
GFR calc non Af Amer: >60 mL/min (ref >60)
GLUCOSE: 122 mg/dL — AB (ref 65–99)
POTASSIUM: 3.5 mmol/L (ref 3.5–5.1)
Phosphorus: 3.3 mg/dL (ref 2.5–4.6)
SODIUM: 144 mmol/L (ref 135–145)

## 2015-12-01 LAB — AEROBIC/ANAEROBIC CULTURE W GRAM STAIN (SURGICAL/DEEP WOUND)
Culture: NO GROWTH
Culture: NO GROWTH

## 2015-12-01 LAB — CK TOTAL AND CKMB (NOT AT ARMC)
CK TOTAL: 291 U/L — AB (ref 38–234)
CK, MB: 2.9 ng/mL (ref 0.5–5.0)
Relative Index: 1 (ref 0.0–2.5)

## 2015-12-01 LAB — TRIGLYCERIDES: Triglycerides: 255 mg/dL — ABNORMAL HIGH (ref <150)

## 2015-12-01 LAB — MAGNESIUM: MAGNESIUM: 2.2 mg/dL (ref 1.7–2.4)

## 2015-12-01 MED ORDER — POTASSIUM CHLORIDE 20 MEQ/15ML (10%) PO SOLN
20.0000 meq | ORAL | Status: AC
  Administered 2015-12-01 (×2): 20 meq
  Filled 2015-12-01 (×2): qty 15

## 2015-12-01 MED ORDER — FENTANYL CITRATE (PF) 100 MCG/2ML IJ SOLN
50.0000 ug | INTRAMUSCULAR | Status: DC | PRN
  Administered 2015-12-02: 25 ug via INTRAVENOUS
  Administered 2015-12-02 (×2): 100 ug via INTRAVENOUS
  Administered 2015-12-02: 25 ug via INTRAVENOUS
  Filled 2015-12-01 (×4): qty 2

## 2015-12-01 MED ORDER — POTASSIUM CHLORIDE 20 MEQ/15ML (10%) PO SOLN
40.0000 meq | Freq: Once | ORAL | Status: AC
  Administered 2015-12-01: 40 meq
  Filled 2015-12-01: qty 30

## 2015-12-01 MED ORDER — FUROSEMIDE 10 MG/ML IJ SOLN
60.0000 mg | Freq: Once | INTRAMUSCULAR | Status: AC
  Administered 2015-12-01: 60 mg via INTRAVENOUS
  Filled 2015-12-01: qty 6

## 2015-12-01 NOTE — Progress Notes (Signed)
PULMONARY / CRITICAL CARE MEDICINE   Name: Kylie [name] MRN: [id number] DOB: 15-Mar-1971    ADMISSION DATE:  11/23/2015 CONSULTATION DATE:  6/26 LOS 8 days  REFERRING MD:  Erin Hearing / FMTS  CHIEF COMPLAINT:  Progressive respiratory failure and abnormal CXR   BRIEF 45 y.o. female admitted 6/24 with cc: progressive shortness of breath. Reported it had been present for the last month, coming and going. Over the last two weeks and especially the last couple of days, dyspnea has gotten worse, and patient has had dry cough. She has had associated right UQ pain and right lower chest pain, worsened by cough. Cough has also caused loss of bladder control. She reported having subjective fever "a couple of times" over the last month. She denies sick contacts. She has not been hospitalized or had antibiotics within the last 3 months. She has not tried any medicines for her symptoms. Some nausea but no v/d. She was admitted to the IM service. Treated w/ working dx of CAP. PCCM asked to see on 6/26 when pt found confused and more hypoxic w/ sats in 40s on room air.     MICROBIOLOGY: MRSA PCR 6/27 & 6/26:  Negative  Urine Ctx 6/26:  Negative Urine Strep Ag 6/26:  Negative Blood Ctx x2 6/26>>> NEG  HIV 6/26:  Nonreactive  R Pleural Fluid 6/27>>>neg  R Pleural Anaerobic Ctx 6/27>>> R Pleural Peel Routine, AFB & Fungal Ctx 6/27>>>  ANTIBIOTICS: Azithromycin 6/24 - 6/27 Rocephin 6/24 - 6/27 Cefuroxime 6/27 (surgical ppx) Vancomycin 6/26 - 6/30 Zosyn 6/27>>>  LINES/TUBES: L RAD ART LINE 6/27 - 6/28 OETT 7.5 6/27>>> R CHEST TUBE S/P VATS 6/27>>> FOLEY 6/27>>> OGT 6/27>>> R IJ CVL 6/27>>> PIV X1   SIGNIFICANT EVENTS: 6/26 - Admitted to 23M for worsening hypoxia and altered ental status CT Chest 6/24: 1. Consolidated lung in the RIGHT lower lobe with differential including pneumonia versus neoplasm. 2. Peribronchial and perihilar thickening on the RIGHT consistent with infection or neoplasm.  3. Mild mediastinal adenopathy differential including metastatic adenopathy versus reactive adenopathy 4. Partially loculated pleural fluid in the lower RIGHT thorax 6/27 - OR for R VATS. in place. EKG 6/29:  QTc 442ms.t. EKG 6/30:  QTc 435ms.  No acute events overnight. Failed pressure support wean this morning on pressure support 10/5 with FiO2 0.5.    SUBJECTIVE/OVERNIGHT/INTERVAL HX Feeling better , sitting up in bed, wants ETT tube out.   1 chest tube left - seen by CVTS 12/01/2015  Calm, following commands.   VITAL SIGNS: BP 105/57 mmHg  Pulse 92  Temp(Src) 99.2 F (37.3 C) (Oral)  Resp 18  Ht 5\' 9"  (1.753 m)  Wt 215 lb 2.7 oz (97.6 kg)  BMI 31.76 kg/m2  SpO2 99%  LMP 11/23/2015  HEMODYNAMICS:    VENTILATOR SETTINGS: Vent Mode:  [-] PRVC FiO2 (%):  [40 %] 40 % Set Rate:  [18 bmp] 18 bmp Vt Set:  [530 mL] 530 mL PEEP:  [5 cmH20] 5 cmH20 Pressure Support:  [12 cmH20] 12 cmH20 Plateau Pressure:  [26 cmH20-30 cmH20] 30 cmH20  INTAKE / OUTPUT: I/O last 3 completed shifts: In: 2748 [I.V.:1555.5; NG/GT:1005; IV Piggyback:187.5] Out: [id number] [Urine:3235]  PHYSICAL EXAMINATION: General: looks critically ill Neuro:  On sedation. Moving all 4 extremities spontaneously. Following commands. Nods to questions. HEENT:  No scleral injection . Endotracheal tube in place. Cardiovascular:  Regular rate & rhythm. No edema or appreciable JVD. Lungs:  Coarse breath sounds bilaterally  Symmetric chest wall rise  on ventilator.  Musculoskeletal:   No joint deformity or effusion.  Skin:  Warm and dry. No rash on exposed skin.   LABS:  PULMONARY  Recent Labs Lab 11/25/15 1143 11/25/15 1810 11/26/15 0055 11/26/15 1448 11/27/15 0445  PHART 7.347* 7.351 7.366 7.383 7.414  PCO2ART 47.9* 56.3* 58.2* 55.9* 49.9*  PO2ART 59.6* 115.0* 99.2 151.0* 101*  HCO3 25.6* 31.2* 32.5* 33.3* 31.3*  TCO2 27.1 33 34.3 35 32.8  O2SAT 88.5 98.0 97.2 99.0 97.6    CBC  Recent Labs Lab  11/29/15 0400 11/30/15 0445 12/01/15 0400  HGB 9.4* 9.5* 9.5*  HCT 30.5* 31.3* 31.5*  WBC 8.7 8.9 9.4  PLT 381 386 410*    COAGULATION  Recent Labs Lab 11/25/15 1824  INR 1.37    CARDIAC  No results for input(s): TROPONINI in the last 168 hours. No results for input(s): PROBNP in the last 168 hours.   CHEMISTRY  Recent Labs Lab 11/27/15 0320 11/28/15 0520 11/29/15 0400 11/30/15 0445 12/01/15 0400  NA 139 141 140 143 144  K 4.0 3.9 3.3* 3.5 3.5  CL 101 103 99* 102 102  CO2 27 31 33* 36* 32  GLUCOSE 147* 95 118* 100* 122*  BUN 8 <5* 10 15 19   CREATININE 0.65 0.53 0.54 0.54 0.52  CALCIUM 7.6* 7.8* 8.2* 8.3* 8.5*  MG 2.2 2.2 2.1 2.2 2.2  PHOS 2.5 3.3 3.8 4.0 3.3   Estimated Creatinine Clearance: 112.8 mL/min (by C-G formula based on Cr of 0.52).   LIVER  Recent Labs Lab 11/25/15 1824 11/28/15 0520 11/29/15 0400 11/30/15 0445 12/01/15 0400  AST 28 28  --   --   --   ALT 19 24  --   --   --   ALKPHOS 119 103  --   --   --   BILITOT <0.1* 0.2*  --   --   --   PROT 6.5 5.5*  --   --   --   ALBUMIN 2.3* 1.5* 1.5* 1.6* 1.6*  INR 1.37  --   --   --   --      INFECTIOUS  Recent Labs Lab 12/01/15 0715  LATICACIDVEN 0.7     ENDOCRINE CBG (last 3)   Recent Labs  12/01/15 0348 12/01/15 0419 12/01/15 0812  GLUCAP 117* 123* 174*         IMAGING x48h  - image(s) personally visualized  -   highlighted in bold Dg Chest Port 1 View  12/01/2015  CLINICAL DATA:  Endotracheal tube present EXAM: PORTABLE CHEST - 1 VIEW COMPARISON:  the previous day's study FINDINGS: Endotracheal tube, nasogastric tube, right IJ central line, and right basilar chest tube are stable in position. no pneumothorax evident. There may be a small right pleural effusion. Heart size normal. Asymmetric patchy airspace opacities throughout the left lung and more confluent at the right lung base, overall stable since previous exam. Visualized bones unremarkable. IMPRESSION: 1.  Stable asymmetric airspace opacities. 2.  Support hardware stable in position. Electronically Signed   By: Lucrezia Europe M.D.   On: 12/01/2015 08:39   Dg Chest Port 1 View  11/30/2015  CLINICAL DATA:  eval changes with empyema right side EXAM: PORTABLE CHEST - 1 VIEW COMPARISON:  the previous day's study FINDINGS: Endotracheal tube, nasogastric tube, and right IJ central line stable. One of 2 right chest tubes has been removed. The basilar tube remains in place. No pneumothorax evident.No definite pleural effusion. Moderate airspace opacities throughout both  lungs involving bases more than apices, slightly increased. Heart size and mediastinal contours are within normal limits. Visualized bones unremarkable. IMPRESSION: 1. Removal of 1 of the 2 right chest tubes without pneumothorax. 2. Some increase in bilateral asymmetric airspace opacities. 3. Remainder Support hardware stable in position. Electronically Signed   By: Lucrezia Europe M.D.   On: 11/30/2015 10:19        ASSESSMENT / PLAN:  PULMONARY A: Acute Hypoxic Respiratory Failure ARDS Severe CAP Right Complicated Parapneumonic Effusion - S/P VATS 6/27. CT in place. H/O VCD  P:   Full Vent Support Daily SBT as tolerated Chest Tube mgt per CVTS Lasix IV daily Duoneb q6hr   CARDIOVASCULAR A:  No acute issues. Echo 6/36/17 - ef 65% with gr 1 dias dysfn  LA tr down  P:  Monitor on telemetry Check EKG in am - monitor QTc on Methadone & Seroquel Continue daily lasix   RENAL A:   Hypokalemia - Replaced   P:   Trending UOP with Foley Monitoring electrolytes & renal function daily  Replacing electrolytes as indicated   GASTROINTESTINAL A:   Nutrition Abdominal Pain - Resolved. Likely due to CAP.  P:  NPO Tube Feedings per nutrition Pepcid VT q12hr Dulcolax VT daily Senna VT bid  HEMATOLOGIC A:   Anemia - Mild & stable. No signs of active bleeding. Leukocytosis - Resolved. Secondary to sepsis.  P:  Trending cell  counts daily w/ CBC Transfuse for Hgb <7.0 or active bleeding SCDs Starting Lovenox [place] daily  INFECTIOUS A:   Severe CAP w/ Complicated Parapneumonic Effusion H/O Hepatitis B Infection  P:   Empiric Zosyn Day #5 Awaiting finalization of cultures  ENDOCRINE A:   H/O Hypothyroidism   P:   Synthroid VT daily Accu-Checks q4hr w/ notification parameters for MD  NEUROLOGIC A:   Sedation on Ventilator H/O Anxiety H/o Bipolar Disease Chronic Pain 7/2 CK level elevated 291   P:   RASS goal: 0 to -1 Fentanyl gtt & IV prn Propofol gtt  - check CK and lactate 12/01/15 Versed IV prn Neurontin 300mg  VT q8hr (home dose 300mg  tid) Continue Methadone 120mg  daily (home dose) Continue Seroquel 150 BID (home dose XR 300mg ) Baclofen 5mg  VT q8hr (home dose 10mg  tid) Hold Vistaril & Lithium Check CK in am .   FAMILY  - Updates: Boyfriend updated at bedside 6/30 by Dr. Ashok Cordia. No family at bedside 7/2/7  - Inter-disciplinary family meet or Palliative Care meeting due by:  7/2  TODAY'S SUMMARY:  45 year old female acute hypoxic respiratory failure from severe community acquired pneumonia and complicated right parapneumonic effusion. Status post VATS. Continuing current doses of Seroquel, Neurontin, methadone, and baclofen. Continuing pressure support wean and gentle diuresis with daily IV Lasix. continuing Zosyn. ALi and Agitated delirium and chronic CNS med dependency - predicts high prob for trach/ltac at los 8 days      Karem Tomaso NP-C  [facility]  [phone]     12/01/2015 9:35 AM

## 2015-12-01 NOTE — Progress Notes (Addendum)
TCTS DAILY ICU PROGRESS NOTE                   [address]            [place] [address]          [phone]   5 Days Post-Op Procedure(s) (LRB): VIDEO ASSISTED THORACOSCOPY (VATS)/EMPYEMA (Right) VIDEO BRONCHOSCOPY (N/A)  Total Length of Stay:  LOS: 8 days   Subjective Fairly alert on the vent  Objective: Vital signs in last 24 hours: Temp:  [98.5 F (36.9 C)-99.8 F (37.7 C)] 99.2 F (37.3 C) (07/02 0815) Pulse Rate:  [73-106] 92 (07/02 0800) Cardiac Rhythm:  [-] Normal sinus rhythm (07/02 0803) Resp:  [15-26] 18 (07/02 0800) BP: (90-119)/(53-87) 105/57 mmHg (07/02 0800) SpO2:  [90 %-99 %] 99 % (07/02 0900) FiO2 (%):  [40 %] 40 % (07/02 0900) Weight:  [215 lb 2.7 oz (97.6 kg)] 215 lb 2.7 oz (97.6 kg) (07/02 0500)  Filed Weights   11/29/15 0251 11/30/15 0400 12/01/15 0500  Weight: 216 lb 0.8 oz (98 kg) 213 lb 6.5 oz (96.8 kg) 215 lb 2.7 oz (97.6 kg)    Weight change: 1 lb 12.2 oz (0.8 kg)   Hemodynamic parameters for last 24 hours:     Vent Mode:  [-] PRVC FiO2 (%):  [40 %] 40 % Set Rate:  [18 bmp] 18 bmp Vt Set:  [530 mL] 530 mL PEEP:  [5 cmH20] 5 cmH20 Pressure Support:  [12 cmH20] 12 cmH20 Plateau Pressure:  [26 cmH20-30 cmH20] 30 cmH20 Intake/Output from previous day: 07/01 0701 - 07/02 0700 In: 1853.4 [I.V.:1020.9; NG/GT:695; IV Piggyback:137.5] Out: 2460 [Urine:2460]  Intake/Output this shift: Total I/O In: 62.6 [I.V.:42.6; NG/GT:20] Out: -   Current Meds: Scheduled Meds: . acetaminophen  1,000 mg Oral Q6H   Or  . acetaminophen (TYLENOL) oral liquid 160 mg/5 mL  1,000 mg Oral Q6H  . antiseptic oral rinse  7 mL Mouth Rinse QID  . baclofen  5 mg Per Tube Q8H  . bisacodyl  10 mg Oral Daily  . chlorhexidine gluconate (SAGE KIT)  15 mL Mouth Rinse BID  . enoxaparin (LOVENOX) injection  40 mg Subcutaneous Q24H  . famotidine  20 mg Per Tube BID  . feeding supplement (PRO-STAT SUGAR FREE 64)  60 mL Per Tube TID  . feeding  supplement (VITAL HIGH PROTEIN)  1,000 mL Per Tube Q24H  . fluconazole (DIFLUCAN) IV  100 mg Intravenous Q24H  . furosemide  20 mg Intravenous Daily  . gabapentin  300 mg Per Tube Q8H  . ipratropium-albuterol  3 mL Nebulization Q6H  . levothyroxine  50 mcg Oral QAC breakfast  . methadone  120 mg Oral Daily  . piperacillin-tazobactam (ZOSYN)  IV  3.375 g Intravenous Q8H  . potassium chloride  20 mEq Per Tube Q4H  . QUEtiapine  150 mg Per Tube Q12H  . sennosides  5 mL Per Tube BID   Continuous Infusions: . fentaNYL infusion INTRAVENOUS 250 mcg/hr (12/01/15 0400)  . propofol (DIPRIVAN) infusion 30 mcg/kg/min (12/01/15 0554)   PRN Meds:.fentaNYL, midazolam, ondansetron (ZOFRAN) IV  General appearance: alert, cooperative and no distress Heart: regular rate and rhythm Lungs: coarse BS Abdomen: soft, non-tender Extremities: no edema Wound: dressings intact  Lab Results: CBC: Recent Labs  11/30/15 0445 12/01/15 0400  WBC 8.9 9.4  HGB 9.5* 9.5*  HCT 31.3* 31.5*  PLT 386 410*   BMET:  Recent Labs  11/30/15 0445 12/01/15 0400  NA 143 144  K 3.5 3.5  CL 102 102  CO2 36* 32  GLUCOSE 100* 122*  BUN 15 19  CREATININE 0.54 0.52  CALCIUM 8.3* 8.5*    ABG    Component Value Date/Time   PHART 7.414 11/27/2015 0445   PCO2ART 49.9* 11/27/2015 0445   PO2ART 101* 11/27/2015 0445   HCO3 31.3* 11/27/2015 0445   TCO2 32.8 11/27/2015 0445   ACIDBASEDEF 4.0* 08/26/2012 1613   O2SAT 97.6 11/27/2015 0445      PT/INR: No results for input(s): LABPROT, INR in the last 72 hours. Radiology: Dg Chest Port 1 View  12/01/2015  CLINICAL DATA:  Endotracheal tube present EXAM: PORTABLE CHEST - 1 VIEW COMPARISON:  the previous day's study FINDINGS: Endotracheal tube, nasogastric tube, right IJ central line, and right basilar chest tube are stable in position. no pneumothorax evident. There may be a small right pleural effusion. Heart size normal. Asymmetric patchy airspace opacities  throughout the left lung and more confluent at the right lung base, overall stable since previous exam. Visualized bones unremarkable. IMPRESSION: 1. Stable asymmetric airspace opacities. 2.  Support hardware stable in position. Electronically Signed   By: Lucrezia Europe M.D.   On: 12/01/2015 08:39   Dg Chest Port 1 View  11/30/2015  CLINICAL DATA:  eval changes with empyema right side EXAM: PORTABLE CHEST - 1 VIEW COMPARISON:  the previous day's study FINDINGS: Endotracheal tube, nasogastric tube, and right IJ central line stable. One of 2 right chest tubes has been removed. The basilar tube remains in place. No pneumothorax evident.No definite pleural effusion. Moderate airspace opacities throughout both lungs involving bases more than apices, slightly increased. Heart size and mediastinal contours are within normal limits. Visualized bones unremarkable. IMPRESSION: 1. Removal of 1 of the 2 right chest tubes without pneumothorax. 2. Some increase in bilateral asymmetric airspace opacities. 3. Remainder Support hardware stable in position. Electronically Signed   By: Lucrezia Europe M.D.   On: 11/30/2015 10:19     Assessment/Plan: S/P Procedure(s) (LRB): VIDEO ASSISTED THORACOSCOPY (VATS)/EMPYEMA (Right) VIDEO BRONCHOSCOPY (N/A)   1 chest tube with no drainage, no air leak, poss d/c soon vs leave in place till off vent 2 labs stable 3 medical management/vent wean per CCM   GOLD,WAYNE E 12/01/2015 9:02 AM  Patient seen and examined, agree with above No drainage or air leak form CT- will dc today  Remo Lipps C. Roxan Hockey, MD [facility] [phone]

## 2015-12-01 NOTE — Progress Notes (Signed)
[facility] ADULT ICU REPLACEMENT PROTOCOL FOR AM LAB REPLACEMENT ONLY  The patient does apply for the [facility] 2 Adult ICU Electrolyte Replacment Protocol based on the criteria listed below:   1. Is GFR >/= 40 ml/min? Yes.    Patient's GFR today is >60 2. Is urine output >/= 0.5 ml/kg/hr for the last 6 hours? Yes.   Patient's UOP is 1.0 ml/kg/hr 3. Is BUN < 60 mg/dL? Yes.    Patient's BUN today is 19 4. Abnormal electrolyte(s): K 3.5 5. Ordered repletion with: per protocol 6. If a panic level lab has been reported, has the CCM MD in charge been notified? No..   Physician:    Ronda Fairly A 12/01/2015 5:07 AM

## 2015-12-01 NOTE — Progress Notes (Signed)
eLink Physician-Brief Progress Note Patient Name: Kylie [name] DOB: Dec 24, 1970 MRN: [id number]   Date of Service  12/01/2015  HPI/Events of Note  Called mergently to room - sitting up , nurses holding her down, was weaning well on PS 8/5 with good Tv earlier & comfortable Quite agitated & gagging on ETT  eICU Interventions  Extubated to [place]     Intervention Category Major Interventions: Respiratory failure - evaluation and management  Vonzell Lindblad V. 12/01/2015, 3:45 PM

## 2015-12-02 ENCOUNTER — Inpatient Hospital Stay (HOSPITAL_COMMUNITY): Payer: Medicaid Other

## 2015-12-02 DIAGNOSIS — J869 Pyothorax without fistula: Principal | ICD-10-CM

## 2015-12-02 LAB — GLUCOSE, CAPILLARY
Glucose-Capillary: 83 mg/dL (ref 65–99)
Glucose-Capillary: 93 mg/dL (ref 65–99)

## 2015-12-02 LAB — CBC WITH DIFFERENTIAL/PLATELET
BASOS PCT: 0 %
Basophils Absolute: 0 10*3/uL (ref 0.0–0.1)
EOS ABS: 0.4 10*3/uL (ref 0.0–0.7)
EOS PCT: 4 %
HCT: 32.3 % — ABNORMAL LOW (ref 36.0–46.0)
HEMOGLOBIN: 9.8 g/dL — AB (ref 12.0–15.0)
Lymphocytes Relative: 24 %
Lymphs Abs: 2.2 10*3/uL (ref 0.7–4.0)
MCH: 28.6 pg (ref 26.0–34.0)
MCHC: 30.3 g/dL (ref 30.0–36.0)
MCV: 94.2 fL (ref 78.0–100.0)
MONOS PCT: 8 %
Monocytes Absolute: 0.7 10*3/uL (ref 0.1–1.0)
NEUTROS PCT: 64 %
Neutro Abs: 6 10*3/uL (ref 1.7–7.7)
PLATELETS: 440 10*3/uL — AB (ref 150–400)
RBC: 3.43 MIL/uL — AB (ref 3.87–5.11)
RDW: 15.2 % (ref 11.5–15.5)
WBC: 9.3 10*3/uL (ref 4.0–10.5)

## 2015-12-02 LAB — RENAL FUNCTION PANEL
Albumin: 2.1 g/dL — ABNORMAL LOW (ref 3.5–5.0)
Anion gap: 20 — ABNORMAL HIGH (ref 5–15)
BUN: 17 mg/dL (ref 6–20)
CALCIUM: 9.9 mg/dL (ref 8.9–10.3)
CHLORIDE: 97 mmol/L — AB (ref 101–111)
CO2: 31 mmol/L (ref 22–32)
CREATININE: 0.58 mg/dL (ref 0.44–1.00)
GFR calc non Af Amer: >60 mL/min (ref >60)
Glucose, Bld: 97 mg/dL (ref 65–99)
Phosphorus: 3.5 mg/dL (ref 2.5–4.6)
Potassium: 3.7 mmol/L (ref 3.5–5.1)
SODIUM: 148 mmol/L — AB (ref 135–145)

## 2015-12-02 LAB — BASIC METABOLIC PANEL
Anion gap: 18 — ABNORMAL HIGH (ref 5–15)
BUN: 17 mg/dL (ref 6–20)
CALCIUM: 10 mg/dL (ref 8.9–10.3)
CHLORIDE: 96 mmol/L — AB (ref 101–111)
CO2: 34 mmol/L — AB (ref 22–32)
CREATININE: 0.65 mg/dL (ref 0.44–1.00)
GLUCOSE: 101 mg/dL — AB (ref 65–99)
Potassium: 3.6 mmol/L (ref 3.5–5.1)
Sodium: 148 mmol/L — ABNORMAL HIGH (ref 135–145)

## 2015-12-02 LAB — TRIGLYCERIDES: Triglycerides: 286 mg/dL — ABNORMAL HIGH (ref <150)

## 2015-12-02 LAB — CK: Total CK: 168 U/L (ref 38–234)

## 2015-12-02 LAB — MAGNESIUM: MAGNESIUM: 2.2 mg/dL (ref 1.7–2.4)

## 2015-12-02 MED ORDER — AMOXICILLIN-POT CLAVULANATE 875-125 MG PO TABS
1.0000 | ORAL_TABLET | Freq: Two times a day (BID) | ORAL | Status: DC
Start: 2015-12-02 — End: 2015-12-05
  Administered 2015-12-02 – 2015-12-05 (×7): 1 via ORAL
  Filled 2015-12-02 (×7): qty 1

## 2015-12-02 MED ORDER — FAMOTIDINE 20 MG PO TABS
20.0000 mg | ORAL_TABLET | Freq: Two times a day (BID) | ORAL | Status: DC
  Administered 2015-12-02 – 2015-12-05 (×7): 20 mg via ORAL
  Filled 2015-12-02 (×7): qty 1

## 2015-12-02 MED ORDER — TRAMADOL HCL 50 MG PO TABS
50.0000 mg | ORAL_TABLET | Freq: Four times a day (QID) | ORAL | Status: DC | PRN
  Administered 2015-12-02 – 2015-12-05 (×6): 50 mg via ORAL
  Filled 2015-12-02 (×6): qty 1

## 2015-12-02 MED ORDER — LITHIUM CARBONATE 300 MG PO CAPS
300.0000 mg | ORAL_CAPSULE | Freq: Two times a day (BID) | ORAL | Status: DC
  Administered 2015-12-02 – 2015-12-05 (×6): 300 mg via ORAL
  Filled 2015-12-02 (×8): qty 1

## 2015-12-02 MED ORDER — SENNA 8.6 MG PO TABS
1.0000 | ORAL_TABLET | Freq: Two times a day (BID) | ORAL | Status: DC
  Administered 2015-12-02 – 2015-12-05 (×4): 8.6 mg via ORAL
  Filled 2015-12-02 (×6): qty 1

## 2015-12-02 MED ORDER — NICOTINE 14 MG/24HR TD PT24
14.0000 mg | MEDICATED_PATCH | Freq: Every day | TRANSDERMAL | Status: DC
  Administered 2015-12-02 – 2015-12-05 (×4): 14 mg via TRANSDERMAL
  Filled 2015-12-02 (×4): qty 1

## 2015-12-02 MED ORDER — IPRATROPIUM-ALBUTEROL 0.5-2.5 (3) MG/3ML IN SOLN
3.0000 mL | Freq: Four times a day (QID) | RESPIRATORY_TRACT | Status: DC
  Administered 2015-12-03 – 2015-12-04 (×5): 3 mL via RESPIRATORY_TRACT
  Filled 2015-12-02 (×5): qty 3

## 2015-12-02 MED ORDER — BACLOFEN 5 MG HALF TABLET
5.0000 mg | ORAL_TABLET | Freq: Three times a day (TID) | ORAL | Status: DC
  Filled 2015-12-02 (×2): qty 1

## 2015-12-02 MED ORDER — BACLOFEN 10 MG PO TABS
5.0000 mg | ORAL_TABLET | Freq: Three times a day (TID) | ORAL | Status: DC
  Administered 2015-12-02 – 2015-12-05 (×11): 5 mg via ORAL
  Filled 2015-12-02 (×11): qty 1

## 2015-12-02 MED ORDER — HYDROXYZINE HCL 25 MG PO TABS
25.0000 mg | ORAL_TABLET | Freq: Three times a day (TID) | ORAL | Status: DC | PRN
  Administered 2015-12-05: 25 mg via ORAL
  Filled 2015-12-02 (×2): qty 1

## 2015-12-02 MED ORDER — IPRATROPIUM-ALBUTEROL 0.5-2.5 (3) MG/3ML IN SOLN: 3.0000 mL | Freq: Four times a day (QID) | RESPIRATORY_TRACT | Status: DC | PRN

## 2015-12-02 MED ORDER — GABAPENTIN 300 MG PO CAPS
300.0000 mg | ORAL_CAPSULE | Freq: Three times a day (TID) | ORAL | Status: DC
  Administered 2015-12-02 – 2015-12-05 (×11): 300 mg via ORAL
  Filled 2015-12-02 (×11): qty 1

## 2015-12-02 NOTE — Progress Notes (Signed)
CCM contacted about pain management. Per Dr. Elsworth Soho, pain medications will not be changed. Will continue to monitor.

## 2015-12-02 NOTE — Progress Notes (Signed)
PULMONARY / CRITICAL CARE MEDICINE   Name: Kylie [name] MRN: [id number] DOB: 10/08/1970    ADMISSION DATE:  11/23/2015 CONSULTATION DATE:  6/26 LOS 9 days  REFERRING MD:  Erin Hearing / [facility]  CHIEF COMPLAINT:  Progressive respiratory failure and abnormal CXR   BRIEF 45 y.o. female admitted 6/24 with cc: progressive shortness of breath. Reported it had been present for the last month, coming and going. Over the last two weeks and especially the last couple of days, dyspnea has gotten worse, and patient has had dry cough. She has had associated right UQ pain and right lower chest pain, worsened by cough. Cough has also caused loss of bladder control. She reported having subjective fever "a couple of times" over the last month. She denies sick contacts. She has not been hospitalized or had antibiotics within the last 3 months. She has not tried any medicines for her symptoms. Some nausea but no v/d. She was admitted to the IM service. Treated w/ working dx of CAP. PCCM asked to see on 6/26 when pt found confused and more hypoxic w/ sats in 40s on room air.     MICROBIOLOGY: MRSA PCR 6/27 & 6/26:  Negative  Urine Ctx 6/26:  Negative Urine Strep Ag 6/26:  Negative Blood Ctx x2 6/26>>> NEG  HIV 6/26:  Nonreactive  R Pleural Fluid 6/27>>>neg  R Pleural Anaerobic Ctx 6/27>>> R Pleural Peel Routine, AFB & Fungal Ctx 6/27>>>  ANTIBIOTICS: Azithromycin 6/24 - 6/27 Rocephin 6/24 - 6/27 Cefuroxime 6/27 (surgical ppx) Vancomycin 6/26 - 6/30 Zosyn 6/27>>>  LINES/TUBES: L RAD ART LINE 6/27 - 6/28 OETT 7.5 6/27-7/2 R CHEST TUBE S/P VATS 6/27 - 7/2 FOLEY 6/27>>> OGT 6/27 - 7/2 R IJ CVL 6/27 - 7/2 PIV X1   SIGNIFICANT EVENTS: 6/26 - Admitted to 20M for worsening hypoxia and altered mental status CT Chest 6/24: 1. Consolidated lung in the RIGHT lower lobe with differential including pneumonia versus neoplasm. 2. Peribronchial and perihilar thickening on the RIGHT consistent with infection or  neoplasm. 3. Mild mediastinal adenopathy differential including metastatic adenopathy versus reactive adenopathy 4. Partially loculated pleural fluid in the lower RIGHT thorax 6/27 - OR for R VATS. in place. EKG 6/29:  QTc 464ms.t. EKG 6/30:  QTc 453ms.  No acute events overnight. Failed pressure support wean this morning on pressure support 10/5 with FiO2 0.5. 7/2 - Extubated to [place]    SUBJECTIVE/OVERNIGHT/INTERVAL HX Pt was very agitated and gagging on ETT yesterday. She was weaning well, so she was successfully extubated to [place]. She has been doing well since.  Tolerating PO diet. Comfortable. Good cough. Asking for her methadone.   VITAL SIGNS: BP 91/59 mmHg  Pulse 82  Temp(Src) 99.1 F (37.3 C) (Oral)  Resp 20  Ht 5\' 9"  (1.753 m)  Wt 92.2 kg (203 lb 4.2 oz)  BMI 30.00 kg/m2  SpO2 96%  LMP 11/23/2015  HEMODYNAMICS:    INTAKE / OUTPUT: I/O last 3 completed shifts: In: 1652.6 [P.O.:1; I.V.:706.6; NG/GT:695; IV Piggyback:250] Out: [id number] [Urine:3550]  PHYSICAL EXAMINATION:  General: well-appearing, talkative Neuro:  Awake, alert, oriented, moving all extremities, no focal deficits HEENT:  No scleral injection. EOMI. MMM. Cardiovascular:  Regular rate & rhythm. No edema or appreciable JVD. Lungs:  Able to speak in full sentences, normal work of breathing, [place] in place, crackles R mid - lower lung fields as well as L base.  Musculoskeletal:   No joint deformity or effusion.  Skin:  Warm and dry. No rash  on exposed skin.   LABS:  PULMONARY  Recent Labs Lab 11/25/15 1143 11/25/15 1810 11/26/15 0055 11/26/15 1448 11/27/15 0445  PHART 7.347* 7.351 7.366 7.383 7.414  PCO2ART 47.9* 56.3* 58.2* 55.9* 49.9*  PO2ART 59.6* 115.0* 99.2 151.0* 101*  HCO3 25.6* 31.2* 32.5* 33.3* 31.3*  TCO2 27.1 33 34.3 35 32.8  O2SAT 88.5 98.0 97.2 99.0 97.6    CBC  Recent Labs Lab 11/30/15 0445 12/01/15 0400 12/02/15 0239  HGB 9.5* 9.5* 9.8*  HCT 31.3* 31.5* 32.3*  WBC 8.9 9.4 9.3   PLT 386 410* 440*    COAGULATION  Recent Labs Lab 11/25/15 1824  INR 1.37    CARDIAC  No results for input(s): TROPONINI in the last 168 hours. No results for input(s): PROBNP in the last 168 hours.   CHEMISTRY  Recent Labs Lab 11/28/15 0520 11/29/15 0400 11/30/15 0445 12/01/15 0400 12/02/15 0239  NA 141 140 143 144 148*  148*  K 3.9 3.3* 3.5 3.5 3.6  3.7  CL 103 99* 102 102 96*  97*  CO2 31 33* 36* 32 34*  31  GLUCOSE 95 118* 100* 122* 101*  97  BUN <5* 10 15 19 17  17   CREATININE 0.53 0.54 0.54 0.52 0.65  0.58  CALCIUM 7.8* 8.2* 8.3* 8.5* 10.0  9.9  MG 2.2 2.1 2.2 2.2 2.2  PHOS 3.3 3.8 4.0 3.3 3.5   Estimated Creatinine Clearance: 109.6 mL/min (by C-G formula based on Cr of 0.58).   LIVER  Recent Labs Lab 11/25/15 1824 11/28/15 0520 11/29/15 0400 11/30/15 0445 12/01/15 0400 12/02/15 0239  AST 28 28  --   --   --   --   ALT 19 24  --   --   --   --   ALKPHOS 119 103  --   --   --   --   BILITOT <0.1* 0.2*  --   --   --   --   PROT 6.5 5.5*  --   --   --   --   ALBUMIN 2.3* 1.5* 1.5* 1.6* 1.6* 2.1*  INR 1.37  --   --   --   --   --      INFECTIOUS  Recent Labs Lab 12/01/15 0715  LATICACIDVEN 0.7     ENDOCRINE CBG (last 3)   Recent Labs  12/01/15 2012 12/02/15 0016 12/02/15 0410  GLUCAP 110* 93 83      IMAGING x48h  - image(s) personally visualized  -   highlighted in bold Dg Chest Port 1 View  12/02/2015  CLINICAL DATA:  Pneumonia EXAM: PORTABLE CHEST 1 VIEW FINDINGS: The endotracheal to, NG and right IJ catheters have been removed. Persistent asymmetric interstitial and airspace process in the lungs. No large pleural effusion or pneumothorax. IMPRESSION: Removal of support apparatus. Persistent interstitial and airspace process in the lungs. Electronically Signed   By: Marijo Sanes M.D.   On: 12/02/2015 07:17   Dg Chest Port 1 View  12/01/2015  CLINICAL DATA:  Endotracheal tube present EXAM: PORTABLE CHEST - 1 VIEW  COMPARISON:  the previous day's study FINDINGS: Endotracheal tube, nasogastric tube, right IJ central line, and right basilar chest tube are stable in position. no pneumothorax evident. There may be a small right pleural effusion. Heart size normal. Asymmetric patchy airspace opacities throughout the left lung and more confluent at the right lung base, overall stable since previous exam. Visualized bones unremarkable. IMPRESSION: 1. Stable asymmetric airspace opacities. 2.  Support hardware stable in position. Electronically Signed   By: Lucrezia Europe M.D.   On: 12/01/2015 08:39   Dg Chest Port 1 View  11/30/2015  CLINICAL DATA:  eval changes with empyema right side EXAM: PORTABLE CHEST - 1 VIEW COMPARISON:  the previous day's study FINDINGS: Endotracheal tube, nasogastric tube, and right IJ central line stable. One of 2 right chest tubes has been removed. The basilar tube remains in place. No pneumothorax evident.No definite pleural effusion. Moderate airspace opacities throughout both lungs involving bases more than apices, slightly increased. Heart size and mediastinal contours are within normal limits. Visualized bones unremarkable. IMPRESSION: 1. Removal of 1 of the 2 right chest tubes without pneumothorax. 2. Some increase in bilateral asymmetric airspace opacities. 3. Remainder Support hardware stable in position. Electronically Signed   By: Lucrezia Europe M.D.   On: 11/30/2015 10:19      ASSESSMENT / PLAN:  PULMONARY A: S/P Acute Hypoxic Respiratory Failure/ARDS Severe CAP Right Complicated Parapneumonic Effusion - S/P VATS 6/27. CT removed 7/2. H/O VCD  P:   Extubated to [place] 7/2- wean O2 as tolerated Chest tube removed 7/2. Has been on zosyn. Will switch to augmentin. Likely will need 4-6 weeks of abx.  Duoneb q6hr, pulmicort BID Needs IS Wean off lasix > d/c in am   CARDIOVASCULAR A:  No acute issues. Echo 6/36/17 - ef 65% with gr 1 dias dysfn  LA tr down  P:  Monitor on  telemetry Daily EKG - monitor QTc on Methadone & Seroquel Continue daily lasix for now > suggest d/c in am   RENAL A:   Hypokalemia - Resolved  P:   Trending UOP with Foley Monitoring electrolytes & renal function daily  Replacing electrolytes as indicated Wean off lasix in am   GASTROINTESTINAL A:   Nutrition Abdominal Pain - Resolved. Likely due to CAP.  P:  OGT discontinued 7/2 Clear liquid diet, advance as tolerated Pepcid po q12hr Dulcolax po daily Senna po bid  HEMATOLOGIC A:   Normocytic Anemia - Mild & stable. No signs of active bleeding. Leukocytosis - Resolved. Secondary to sepsis.  P:  Trending cell counts daily w/ CBC Transfuse for Hgb <7.0 or active bleeding SCDs Lovenox Rebecca daily  INFECTIOUS A:   Severe CAP w/ Complicated Parapneumonic Effusion H/O Hepatitis B Infection  P:   Empiric Zosyn Day #7 > switch to augmentin PO. Plan for 4-6 weeks abx Anaerobic, aerobic, AFB, and fungus cultures all with no growth Will need a rpt chest ct scan in 4-6 weeks to ensure resolution.   ENDOCRINE A:   H/O Hypothyroidism   P:   Synthroid po daily Accu-Checks q4hr w/ notification parameters for MD  NEUROLOGIC A:   H/O Anxiety H/o Bipolar Disease Chronic Pain 7/2 CK level elevated 291, trended down to 168 on 7/3  P:   Neurontin 300mg  po q8hr (home dose 300mg  tid) Continue Methadone 120mg  daily (home dose) Continue Seroquel 150 BID (home dose XR 300mg ) Baclofen 5mg  po q8hr (home dose 10mg  tid) Restart home Vistaril & Lithium today Nicotine patch   FAMILY  - Updates: Boyfriend updated at bedside 6/30 by Dr. Ashok Cordia and 7/3 by Dr. Brett Albino.  - Inter-disciplinary family meet or Palliative Care meeting due by:  7/2  TODAY'S SUMMARY:  45 year old female acute hypoxic respiratory failure from severe community acquired pneumonia and complicated right parapneumonic effusion. Status post VATS. Continuing current doses of Seroquel, Neurontin, methadone, and  baclofen.  Successfully extubated on 7/2  and has been stable on 5L . Plan for transfer back to [facility] today. Consider weaning off lasix. Switched to augmentin and plan for 4-6 weeks abx.     Hyman Bible, MD Family Medicine, PGY-2   12/02/2015 8:56 AM    ATTENDING NOTE / ATTESTATION NOTE :   I have discussed the case with the resident/APP  Dr. Hyman Bible.   I agree with the resident/APP's  history, physical examination, assessment, and plans.    I have edited the above note and modified it according to our agreed history, physical examination, assessment and plan.   Family :Boyfriend updated at bedside. Pt will transfer to Med Surg to [facility] Med Service. PCCM will sign off starting 7/4. Call back if with issues.    Monica Becton, MD 12/02/2015, 11:50 AM [facility] Pager [phone] After 3 pm or if no answer, call [phone]

## 2015-12-02 NOTE — Progress Notes (Signed)
NURSING PROGRESS NOTE  Kylie [name] [id number] Transfer Data: 12/02/2015 3:38 PM Attending Provider: Rush Landmark, MD [id number] ARTHUR, MD Code Status: FULL  Kylie [name] is a 45 y.o. female patient transferred from 55M  -No acute distress noted.  -No complaints of shortness of breath.  -No complaints of chest pain.     Blood pressure 119/63, pulse 86, temperature 97.8 F (36.6 C), temperature source Oral, resp. rate 16, height 5\' 7"  (1.702 m), weight 93.305 kg (205 lb 11.2 oz), last menstrual period 11/23/2015, SpO2 94 %.   IV Fluids:  IV in place, may refer to [facility].  Allergies:  Pregabalin; Hydrocodone; Citalopram; Ibuprofen; Naproxen; and Povidone-iodine  Past Medical History:   has a past medical history of Hepatitis B infection; Cervical cancer ([place]); Degenerative disk disease; Ovarian cyst; Chronic UTI; Anxiety; Bipolar 1 disorder ([place]); and Panic attack.  Past Surgical History:   has past surgical history that includes Tubal ligation; Mandible fracture surgery; Wisdom tooth extraction; Cervix surgery; Video assisted thoracoscopy (vats)/empyema (Right, 11/26/2015); and Video bronchoscopy (N/A, 11/26/2015).  Social History:   reports that she has been smoking Cigarettes.  She has a 25 pack-year smoking history. She has never used smokeless tobacco. She reports that she does not drink alcohol or use illicit drugs.  Skin: Intact  Patient/Family orientated to room. Information packet given to patient/family. Admission inpatient armband information verified with patient/family to include name and date of birth and placed on patient arm. Side rails up x 2, fall assessment and education completed with patient/family. Patient/family able to verbalize understanding of risk associated with falls and verbalized understanding to call for assistance before getting out of bed. Call light within reach. Patient/family able to voice and demonstrate understanding of unit  orientation instructions.    Will continue to evaluate and treat per MD orders.

## 2015-12-02 NOTE — Progress Notes (Signed)
Pt t/x in wheelchair with O2 to 5W37 without incident. Np pt belongings present. Cabin crew given report and Danielle NT there for pt arrival  Lorre Munroe

## 2015-12-03 ENCOUNTER — Inpatient Hospital Stay (HOSPITAL_COMMUNITY): Payer: Medicaid Other

## 2015-12-03 DIAGNOSIS — J869 Pyothorax without fistula: Secondary | ICD-10-CM | POA: Insufficient documentation

## 2015-12-03 LAB — RENAL FUNCTION PANEL
ALBUMIN: 2.2 g/dL — AB (ref 3.5–5.0)
ANION GAP: 10 (ref 5–15)
BUN: 15 mg/dL (ref 6–20)
CHLORIDE: 93 mmol/L — AB (ref 101–111)
CO2: 37 mmol/L — AB (ref 22–32)
Calcium: 9.3 mg/dL (ref 8.9–10.3)
Creatinine, Ser: 0.67 mg/dL (ref 0.44–1.00)
GFR calc Af Amer: >60 mL/min (ref >60)
GFR calc non Af Amer: >60 mL/min (ref >60)
GLUCOSE: 106 mg/dL — AB (ref 65–99)
PHOSPHORUS: 3.9 mg/dL (ref 2.5–4.6)
POTASSIUM: 3.8 mmol/L (ref 3.5–5.1)
Sodium: 140 mmol/L (ref 135–145)

## 2015-12-03 LAB — CBC WITH DIFFERENTIAL/PLATELET
Basophils Absolute: 0 10*3/uL (ref 0.0–0.1)
Basophils Relative: 0 %
Eosinophils Absolute: 0.5 10*3/uL (ref 0.0–0.7)
Eosinophils Relative: 5 %
HEMATOCRIT: 35.1 % — AB (ref 36.0–46.0)
Hemoglobin: 11 g/dL — ABNORMAL LOW (ref 12.0–15.0)
LYMPHS ABS: 2.6 10*3/uL (ref 0.7–4.0)
LYMPHS PCT: 28 %
MCH: 28.9 pg (ref 26.0–34.0)
MCHC: 31.3 g/dL (ref 30.0–36.0)
MCV: 92.4 fL (ref 78.0–100.0)
MONO ABS: 0.7 10*3/uL (ref 0.1–1.0)
MONOS PCT: 8 %
NEUTROS ABS: 5.5 10*3/uL (ref 1.7–7.7)
Neutrophils Relative %: 59 %
Platelets: 520 10*3/uL — ABNORMAL HIGH (ref 150–400)
RBC: 3.8 MIL/uL — ABNORMAL LOW (ref 3.87–5.11)
RDW: 14.6 % (ref 11.5–15.5)
WBC: 9.2 10*3/uL (ref 4.0–10.5)

## 2015-12-03 LAB — MAGNESIUM: Magnesium: 2.2 mg/dL (ref 1.7–2.4)

## 2015-12-03 NOTE — Progress Notes (Signed)
Family Medicine Teaching Service Daily Progress Note Intern Pager: [phone]  Patient name: Kylie [name] Medical record number: [id number] Date of birth: 1971-05-26 Age: 45 y.o. Gender: female  Primary Care Provider: Zigmund Gottron, MD Consultants:  Code Status: FULL    Pt Overview and Major Events to Date:  6/24 admitted with CC: SOB, treated for CAP on azithromycin and ceftriaxone 6/24-6/27 6/26 - transferred to CCU for hypoxic resp failure and AMS started on vanc 6/26-6/30 6/27 - zosyn, d/c'd 7/3 6/27 - OR for R VATS 6/29- QTC= 436 6/30 - QTC= 440 7/2 - extubated 7/3 transferred to FMTS and started on augmentin  Assessment and Plan:   Community acquired pneumonia:  Complicated by acute hypoxic respiratory failure with parapneumonic effusion s/p VATS 6/27 with chest tube removed 7/2.   - extubated to [place] 7/2- wean o2 as tolerated - D/C lasix -duoneb q6hr, pulmicort BID -augmentin day 2 plan for 4-6 weeks.  -repeat CT scan 4-6 weeks - f/u blood cultures -plan to wean from [place] -CXR today shows improved aeration   Hypokalemia:  Previous hypokalemic in CCU, appears to be resolved.  -monitor BMP   Abdominal pain: now resolved, having bowel movements without discomfort.  -advance diet to full.  -pepcid PO Q12hr -ducolax PO daily -senna po BID  Normocytic anemia: mild and stable -assess ability to ambulate, otherwise continue with lovenox daily or SCDs  Hypothyroidism: stable -synthroid daily  anxiety and bipolar disease:   -continue home regimen -start vistaril and lithium -daily EKG to monitor QTC (seroquel and methadone)  chronic pain: receives methadone 120mg  from clinic -methadone, seroquel, baclofen   FEN/GI: Regular diet PPx: lovenox  Disposition: Pending clinical improvement and tolerating full diet. Plan to wean from [place]   Subjective:  The patient was transferred out of the CCU this morning to our service and feels much better. She was extubated  and had her chest tube removed on 7/2.  She complains of pain at the site of her chest tube, which is expected and understands this will subside and she will remain on her methadone and tylenol for pain management.  She is still on a nasal canula for oxygen and the plan is to assess if she actually needs it.    Objective: Temp:  [97.8 F (36.6 C)-98.9 F (37.2 C)] 97.9 F (36.6 C) (07/04 1407) Pulse Rate:  [76-91] 76 (07/04 1407) Resp:  [16-17] 16 (07/04 1407) BP: (95-120)/(49-63) 95/60 mmHg (07/04 1407) SpO2:  [90 %-98 %] 98 % (07/04 1407) Weight:  [205 lb 11.2 oz (93.305 kg)] 205 lb 11.2 oz (93.305 kg) (07/03 1449) Physical Exam: General: Well appearing, middle aged female in NAD Cardiovascular: RRR, s1/s2 present, no MRG appreciated Respiratory: CTABL Abdomen: soft, non-tender, non-distended.  Extremities: warm and well perfused, no cyanosis or edema MSK:  Wound on right side appears c/d and non-indurated.  Psych: normal mood and affect  Laboratory:  Recent Labs Lab 12/01/15 0400 12/02/15 0239 12/03/15 0538  WBC 9.4 9.3 9.2  HGB 9.5* 9.8* 11.0*  HCT 31.5* 32.3* 35.1*  PLT 410* 440* 520*    Recent Labs Lab 11/28/15 0520  12/01/15 0400 12/02/15 0239 12/03/15 0538  NA 141  < > 144 148*  148* 140  K 3.9  < > 3.5 3.6  3.7 3.8  CL 103  < > 102 96*  97* 93*  CO2 31  < > 32 34*  31 37*  BUN <5*  < > 19 17  17 15   CREATININE  0.53  < > 0.52 0.65  0.58 0.67  CALCIUM 7.8*  < > 8.5* 10.0  9.9 9.3  PROT 5.5*  --   --   --   --   BILITOT 0.2*  --   --   --   --   ALKPHOS 103  --   --   --   --   ALT 24  --   --   --   --   AST 28  --   --   --   --   GLUCOSE 95  < > 122* 101*  97 106*  < > = values in this interval not displayed.   Imaging/Diagnostic Tests: Dg Chest 2 View  12/03/2015  CLINICAL DATA:  Shortness of breath.  Pneumonia.  Empyema. EXAM: CHEST  2 VIEW COMPARISON:  12/02/2015. FINDINGS: Normal cardiomediastinal silhouette. Improved aeration with  clearing of bibasilar opacities, with some persistent airspace disease on the RIGHT. Stable RIGHT CP angle blunting. No osseous findings. IMPRESSION: Improved aeration.  Persistent opacity RIGHT base. Electronically Signed   By: Staci Righter M.D.   On: 12/03/2015 08:41   Dg Chest Port 1 View  12/02/2015  CLINICAL DATA:  Pneumonia EXAM: PORTABLE CHEST 1 VIEW FINDINGS: The endotracheal to, NG and right IJ catheters have been removed. Persistent asymmetric interstitial and airspace process in the lungs. No large pleural effusion or pneumothorax. IMPRESSION: Removal of support apparatus. Persistent interstitial and airspace process in the lungs. Electronically Signed   By: Marijo Sanes M.D.   On: 12/02/2015 07:17   Dg Chest Port 1 View  12/01/2015  CLINICAL DATA:  Endotracheal tube present EXAM: PORTABLE CHEST - 1 VIEW COMPARISON:  the previous day's study FINDINGS: Endotracheal tube, nasogastric tube, right IJ central line, and right basilar chest tube are stable in position. no pneumothorax evident. There may be a small right pleural effusion. Heart size normal. Asymmetric patchy airspace opacities throughout the left lung and more confluent at the right lung base, overall stable since previous exam. Visualized bones unremarkable. IMPRESSION: 1. Stable asymmetric airspace opacities. 2.  Support hardware stable in position. Electronically Signed   By: Lucrezia Europe M.D.   On: 12/01/2015 08:39   Dg Chest Port 1 View  11/30/2015  CLINICAL DATA:  eval changes with empyema right side EXAM: PORTABLE CHEST - 1 VIEW COMPARISON:  the previous day's study FINDINGS: Endotracheal tube, nasogastric tube, and right IJ central line stable. One of 2 right chest tubes has been removed. The basilar tube remains in place. No pneumothorax evident.No definite pleural effusion. Moderate airspace opacities throughout both lungs involving bases more than apices, slightly increased. Heart size and mediastinal contours are within normal  limits. Visualized bones unremarkable. IMPRESSION: 1. Removal of 1 of the 2 right chest tubes without pneumothorax. 2. Some increase in bilateral asymmetric airspace opacities. 3. Remainder Support hardware stable in position. Electronically Signed   By: Lucrezia Europe M.D.   On: 11/30/2015 10:19    Eloise Levels, MD 12/03/2015, 2:46 PM PGY-1, [place] Intern pager: [phone], text pages welcome

## 2015-12-03 NOTE — Progress Notes (Addendum)
      [address]       ,[place] [id number]             [phone]      7 Days Post-Op Procedure(s) (LRB): VIDEO ASSISTED THORACOSCOPY (VATS)/EMPYEMA (Right) VIDEO BRONCHOSCOPY (N/A)   Subjective:  Complains of pain, wants diet advanced  Objective: Vital signs in last 24 hours: Temp:  [97.8 F (36.6 C)-98.9 F (37.2 C)] 98.9 F (37.2 C) (07/04 0518) Pulse Rate:  [82-91] 91 (07/04 0518) Cardiac Rhythm:  [-] Normal sinus rhythm (07/03 2013) Resp:  [15-20] 17 (07/04 0518) BP: (94-120)/(49-72) 113/49 mmHg (07/04 0518) SpO2:  [90 %-97 %] 94 % (07/04 0838) Weight:  [205 lb 11.2 oz (93.305 kg)] 205 lb 11.2 oz (93.305 kg) (07/03 1449)  Intake/Output from previous day: 07/03 0701 - 07/04 0700 In: 245 [P.O.:220; IV Piggyback:25] Out: 1270 [Urine:1270]  General appearance: alert, cooperative and no distress Heart: regular rate and rhythm Lungs: rhonchi bilaterally Abdomen: soft, non-tender; bowel sounds normal; no masses,  no organomegaly Wound: clean and dry  Lab Results:  Recent Labs  12/02/15 0239 12/03/15 0538  WBC 9.3 9.2  HGB 9.8* 11.0*  HCT 32.3* 35.1*  PLT 440* 520*   BMET:  Recent Labs  12/02/15 0239 12/03/15 0538  NA 148*  148* 140  K 3.6  3.7 3.8  CL 96*  97* 93*  CO2 34*  31 37*  GLUCOSE 101*  97 106*  BUN 17  17 15   CREATININE 0.65  0.58 0.67  CALCIUM 10.0  9.9 9.3    PT/INR: No results for input(s): LABPROT, INR in the last 72 hours. ABG    Component Value Date/Time   PHART 7.414 11/27/2015 0445   HCO3 31.3* 11/27/2015 0445   TCO2 32.8 11/27/2015 0445   ACIDBASEDEF 4.0* 08/26/2012 1613   O2SAT 97.6 11/27/2015 0445   CBG (last 3)   Recent Labs  12/01/15 2012 12/02/15 0016 12/02/15 0410  GLUCAP 110* 93 83    Assessment/Plan: S/P Procedure(s) (LRB): VIDEO ASSISTED THORACOSCOPY (VATS)/EMPYEMA (Right) VIDEO BRONCHOSCOPY (N/A)  1. Empyema- chest tubes are out, CXR is stable, normal post surgical changes,  R base opacity present 2. Dispo- care per medicine, continue ABX, will sign off... Follow up in 2 weeks... Will place appointment in chart   LOS: 10 days    BARRETT, ERIN 12/03/2015  Patient seen and examined CXR typical post decortication appearance Pain- on methadone and neurontin  Analynn Daum C. Roxan Hockey, MD [facility] [phone]

## 2015-12-04 DIAGNOSIS — Z4659 Encounter for fitting and adjustment of other gastrointestinal appliance and device: Secondary | ICD-10-CM

## 2015-12-04 DIAGNOSIS — J939 Pneumothorax, unspecified: Secondary | ICD-10-CM

## 2015-12-04 DIAGNOSIS — Z419 Encounter for procedure for purposes other than remedying health state, unspecified: Secondary | ICD-10-CM

## 2015-12-04 DIAGNOSIS — Z09 Encounter for follow-up examination after completed treatment for conditions other than malignant neoplasm: Secondary | ICD-10-CM

## 2015-12-04 LAB — CBC WITH DIFFERENTIAL/PLATELET
BASOS ABS: 0 10*3/uL (ref 0.0–0.1)
BASOS PCT: 0 %
EOS ABS: 0.3 10*3/uL (ref 0.0–0.7)
Eosinophils Relative: 4 %
HCT: 33 % — ABNORMAL LOW (ref 36.0–46.0)
HEMOGLOBIN: 10.2 g/dL — AB (ref 12.0–15.0)
Lymphocytes Relative: 20 %
Lymphs Abs: 1.7 10*3/uL (ref 0.7–4.0)
MCH: 28.3 pg (ref 26.0–34.0)
MCHC: 30.9 g/dL (ref 30.0–36.0)
MCV: 91.7 fL (ref 78.0–100.0)
MONOS PCT: 9 %
Monocytes Absolute: 0.8 10*3/uL (ref 0.1–1.0)
NEUTROS ABS: 5.7 10*3/uL (ref 1.7–7.7)
NEUTROS PCT: 67 %
Platelets: 506 10*3/uL — ABNORMAL HIGH (ref 150–400)
RBC: 3.6 MIL/uL — ABNORMAL LOW (ref 3.87–5.11)
RDW: 14.4 % (ref 11.5–15.5)
WBC: 8.5 10*3/uL (ref 4.0–10.5)

## 2015-12-04 LAB — RENAL FUNCTION PANEL
ALBUMIN: 2.1 g/dL — AB (ref 3.5–5.0)
Anion gap: 8 (ref 5–15)
BUN: 8 mg/dL (ref 6–20)
CO2: 34 mmol/L — AB (ref 22–32)
CREATININE: 0.64 mg/dL (ref 0.44–1.00)
Calcium: 9.1 mg/dL (ref 8.9–10.3)
Chloride: 96 mmol/L — ABNORMAL LOW (ref 101–111)
GFR calc non Af Amer: >60 mL/min (ref >60)
GLUCOSE: 111 mg/dL — AB (ref 65–99)
PHOSPHORUS: 4.2 mg/dL (ref 2.5–4.6)
Potassium: 3.8 mmol/L (ref 3.5–5.1)
SODIUM: 138 mmol/L (ref 135–145)

## 2015-12-04 LAB — MAGNESIUM: MAGNESIUM: 2.3 mg/dL (ref 1.7–2.4)

## 2015-12-04 MED ORDER — ALBUTEROL SULFATE (2.5 MG/3ML) 0.083% IN NEBU: 2.5000 mg | INHALATION_SOLUTION | RESPIRATORY_TRACT | Status: DC | PRN

## 2015-12-04 NOTE — Progress Notes (Signed)
Nutrition Follow-up  DOCUMENTATION CODES:   Obesity unspecified  INTERVENTION:   -Continue with regular diet  NUTRITION DIAGNOSIS:   Inadequate oral intake related to inability to eat as evidenced by NPO status.  Resolved  GOAL:   Patient will meet greater than or equal to 90% of their needs  Met  MONITOR:   PO intake, Weight trends, Skin, Labs, I & O's  REASON FOR ASSESSMENT:   Rounds Enteral/tube feeding initiation and management  ASSESSMENT:   45 y.o. female admitted 6/24 with complaint of progressive shortness of breath related to CAP. Transferred to MICU on 6/26 for worsening hypoxia and AMS. S/P right VATS on 6/27.  Pt extubated on 12/01/15. Transferred from ICU to medical floor on 12/02/15.   Pt sitting in bed consuming lunch at time of visit. She reports she always has a good appetite and is happy to be upgraded to a regular diet ("I was getting sick of that chicken broth over and over again"). She reports consuming 100% of her eggs and pancakes this morning at breakfast. Meal completion 75-95%.   Pt denies any weight loss PTA. She reports no further nutritional concerns, but expressed appreciation for visit.   Labs reviewed.   Diet Order:  Diet regular Room service appropriate?: Yes; Fluid consistency:: Thin  Skin:  Reviewed, no issues  Last BM:  12/03/15  Height:   Ht Readings from Last 1 Encounters:  12/02/15 '5\' 7"'  (1.702 m)    Weight:   Wt Readings from Last 1 Encounters:  12/02/15 205 lb 11.2 oz (93.305 kg)    Ideal Body Weight:  65.9 kg  BMI:  Body mass index is 32.21 kg/(m^2).  Estimated Nutritional Needs:   Kcal:  1700-1900  Protein:  80-95 grams  Fluid:  1.7-1.9 L  EDUCATION NEEDS:   No education needs identified at this time  Konstantine Gervasi A. Jimmye Norman, RD, LDN, CDE Pager: [phone] After hours Pager: [phone]

## 2015-12-04 NOTE — Progress Notes (Signed)
SATURATION QUALIFICATIONS: (This note is used to comply with regulatory documentation for home oxygen)  Patient Saturations on Room Air at Rest = 96%  Patient Saturations on Room Air while Ambulating = 79%

## 2015-12-04 NOTE — Progress Notes (Signed)
Family Medicine Teaching Service Daily Progress Note Intern Pager: [phone]  Patient name: Kylie [name] Medical record number: [id number] Date of birth: [birth year] Age: 45 y.o. Gender: female  Primary Care Provider: Zigmund Gottron, MD Consultants:  Code Status: FULL    Pt Overview and Major Events to Date:  6/24 admitted with CC: SOB, treated for CAP on azithromycin and ceftriaxone 6/24-6/27 6/26 - transferred to CCU for hypoxic resp failure and AMS started on vanc 6/26-6/30 6/27 - zosyn, d/c'd 7/3 6/27 - OR for R VATS 6/29- QTC= 436 6/30 - QTC= 440 7/2 - extubated 7/3 transferred to FMTS and started on augmentin  Assessment and Plan:   Community acquired pneumonia:  Complicated by acute hypoxic respiratory failure with parapneumonic effusion s/p VATS 6/27 with chest tube removed 7/2.  Still requiring 3L via [place] and desatted with ambulation.  Considering discharging with home oxygen given her complicated course this admission.  Will assess her eligibility for home O2. - extubated to [place] 7/2- wean o2 as tolerated -duoneb q6hr, pulmicort BID -augmentin day 4 plan for 4-6 weeks.  -repeat CT scan 4-6 weeks - f/u blood cultures - spoke with pts nurse to document sats with and without ambulation  -will switch from neb to MDI for discharge  Hypokalemia:  Previous hypokalemic in CCU, appears to be resolved.  -monitor BMP   Abdominal pain: now resolved, without discomfort.  -diet advanced to full.  -pepcid PO Q12hr -ducolax PO daily -senna po BID  Normocytic anemia: mild and stable -assess ability to ambulate, otherwise continue with lovenox daily or SCDs  Hypothyroidism: stable -synthroid daily  anxiety and bipolar disease:   -continue home regimen -start vistaril and lithium -daily EKG to monitor QTC (seroquel and methadone)  chronic pain: receives methadone 120mg  from clinic -methadone, seroquel, baclofen   FEN/GI: Regular diet PPx: lovenox  Disposition:  Considering sending home on home O2, will assess if eligible.  Subjective:  Patient feels better this AM.  She is still on 3L  and desatted into the 80s last night while ambulating.  Still only pushing about 750 on her spirometer.  Unclear if what her baseline lung function might be due to her 2 pack/day smoking history.  Given her complicated history this admission we feel that she might do well on home O2 for a couple of weeks, given her current O2 requirement.   Tolerated full diet yesterday.   Objective: Temp:  [97.9 F (36.6 C)-98.7 F (37.1 C)] 97.9 F (36.6 C) (07/05 0630) Pulse Rate:  [76-95] 84 (07/05 0630) Resp:  [16-18] 18 (07/05 0630) BP: (95-118)/(56-60) 118/57 mmHg (07/05 0630) SpO2:  [92 %-98 %] 95 % (07/05 0934) Physical Exam: General: Well appearing, middle aged female in NAD Cardiovascular: RRR, s1/s2 present, no MRG appreciated Respiratory: CTABL Abdomen: soft, non-tender, non-distended.  Extremities: warm and well perfused, no cyanosis or edema MSK:  Wound on right side appears c/d and non-indurated.  Psych: normal mood and affect  Laboratory:  Recent Labs Lab 12/02/15 0239 12/03/15 0538 12/04/15 0713  WBC 9.3 9.2 8.5  HGB 9.8* 11.0* 10.2*  HCT 32.3* 35.1* 33.0*  PLT 440* 520* 506*    Recent Labs Lab 11/28/15 0520  12/02/15 0239 12/03/15 0538 12/04/15 0713  NA 141  < > 148*  148* 140 138  K 3.9  < > 3.6  3.7 3.8 3.8  CL 103  < > 96*  97* 93* 96*  CO2 31  < > 34*  31 37* 34*  BUN <  5*  < > 17  17 15 8   CREATININE 0.53  < > 0.65  0.58 0.67 0.64  CALCIUM 7.8*  < > 10.0  9.9 9.3 9.1  PROT 5.5*  --   --   --   --   BILITOT 0.2*  --   --   --   --   ALKPHOS 103  --   --   --   --   ALT 24  --   --   --   --   AST 28  --   --   --   --   GLUCOSE 95  < > 101*  97 106* 111*  < > = values in this interval not displayed.   Imaging/Diagnostic Tests: Dg Chest 2 View  12/03/2015  CLINICAL DATA:  Shortness of breath.  Pneumonia.  Empyema. EXAM:  CHEST  2 VIEW COMPARISON:  12/02/2015. FINDINGS: Normal cardiomediastinal silhouette. Improved aeration with clearing of bibasilar opacities, with some persistent airspace disease on the RIGHT. Stable RIGHT CP angle blunting. No osseous findings. IMPRESSION: Improved aeration.  Persistent opacity RIGHT base. Electronically Signed   By: Staci Righter M.D.   On: 12/03/2015 08:41   Dg Chest Port 1 View  12/02/2015  CLINICAL DATA:  Pneumonia EXAM: PORTABLE CHEST 1 VIEW FINDINGS: The endotracheal to, NG and right IJ catheters have been removed. Persistent asymmetric interstitial and airspace process in the lungs. No large pleural effusion or pneumothorax. IMPRESSION: Removal of support apparatus. Persistent interstitial and airspace process in the lungs. Electronically Signed   By: Marijo Sanes M.D.   On: 12/02/2015 07:17   Dg Chest Port 1 View  12/01/2015  CLINICAL DATA:  Endotracheal tube present EXAM: PORTABLE CHEST - 1 VIEW COMPARISON:  the previous day's study FINDINGS: Endotracheal tube, nasogastric tube, right IJ central line, and right basilar chest tube are stable in position. no pneumothorax evident. There may be a small right pleural effusion. Heart size normal. Asymmetric patchy airspace opacities throughout the left lung and more confluent at the right lung base, overall stable since previous exam. Visualized bones unremarkable. IMPRESSION: 1. Stable asymmetric airspace opacities. 2.  Support hardware stable in position. Electronically Signed   By: Lucrezia Europe M.D.   On: 12/01/2015 08:39    Eloise Levels, MD 12/04/2015, 12:22 PM PGY-1, [place] Intern pager: [phone], text pages welcome

## 2015-12-05 ENCOUNTER — Inpatient Hospital Stay (HOSPITAL_COMMUNITY): Payer: Medicaid Other

## 2015-12-05 DIAGNOSIS — J9601 Acute respiratory failure with hypoxia: Secondary | ICD-10-CM

## 2015-12-05 DIAGNOSIS — M545 Low back pain: Secondary | ICD-10-CM

## 2015-12-05 DIAGNOSIS — Z9689 Presence of other specified functional implants: Secondary | ICD-10-CM | POA: Insufficient documentation

## 2015-12-05 DIAGNOSIS — R0600 Dyspnea, unspecified: Secondary | ICD-10-CM | POA: Insufficient documentation

## 2015-12-05 DIAGNOSIS — F411 Generalized anxiety disorder: Secondary | ICD-10-CM | POA: Insufficient documentation

## 2015-12-05 DIAGNOSIS — G8929 Other chronic pain: Secondary | ICD-10-CM

## 2015-12-05 DIAGNOSIS — R0602 Shortness of breath: Secondary | ICD-10-CM | POA: Insufficient documentation

## 2015-12-05 DIAGNOSIS — Z789 Other specified health status: Secondary | ICD-10-CM

## 2015-12-05 LAB — MAGNESIUM: Magnesium: 2.3 mg/dL (ref 1.7–2.4)

## 2015-12-05 LAB — RENAL FUNCTION PANEL
Albumin: 2.3 g/dL — ABNORMAL LOW (ref 3.5–5.0)
Anion gap: 11 (ref 5–15)
BUN: 8 mg/dL (ref 6–20)
CHLORIDE: 100 mmol/L — AB (ref 101–111)
CO2: 26 mmol/L (ref 22–32)
Calcium: 9.1 mg/dL (ref 8.9–10.3)
Creatinine, Ser: 0.59 mg/dL (ref 0.44–1.00)
Glucose, Bld: 100 mg/dL — ABNORMAL HIGH (ref 65–99)
POTASSIUM: 4.2 mmol/L (ref 3.5–5.1)
Phosphorus: 3.6 mg/dL (ref 2.5–4.6)
Sodium: 137 mmol/L (ref 135–145)

## 2015-12-05 LAB — CBC WITH DIFFERENTIAL/PLATELET
Basophils Absolute: 0 10*3/uL (ref 0.0–0.1)
Basophils Relative: 0 %
EOS PCT: 3 %
Eosinophils Absolute: 0.3 10*3/uL (ref 0.0–0.7)
HCT: 34.7 % — ABNORMAL LOW (ref 36.0–46.0)
Hemoglobin: 10.7 g/dL — ABNORMAL LOW (ref 12.0–15.0)
LYMPHS ABS: 2.4 10*3/uL (ref 0.7–4.0)
LYMPHS PCT: 27 %
MCH: 28.5 pg (ref 26.0–34.0)
MCHC: 30.8 g/dL (ref 30.0–36.0)
MCV: 92.3 fL (ref 78.0–100.0)
MONOS PCT: 9 %
Monocytes Absolute: 0.8 10*3/uL (ref 0.1–1.0)
Neutro Abs: 5.4 10*3/uL (ref 1.7–7.7)
Neutrophils Relative %: 61 %
PLATELETS: 559 10*3/uL — AB (ref 150–400)
RBC: 3.76 MIL/uL — AB (ref 3.87–5.11)
RDW: 14.4 % (ref 11.5–15.5)
WBC: 8.8 10*3/uL (ref 4.0–10.5)

## 2015-12-05 MED ORDER — BACLOFEN 10 MG PO TABS: 5.0000 mg | ORAL_TABLET | Freq: Three times a day (TID) | ORAL | Status: DC

## 2015-12-05 MED ORDER — AMOXICILLIN-POT CLAVULANATE 875-125 MG PO TABS: 1.0000 | ORAL_TABLET | Freq: Two times a day (BID) | ORAL | Status: DC

## 2015-12-05 MED ORDER — FENTANYL 50 MCG/HR TD PT72
100.0000 ug | MEDICATED_PATCH | TRANSDERMAL | Status: DC
  Administered 2015-12-05: 100 ug via TRANSDERMAL
  Filled 2015-12-05: qty 2

## 2015-12-05 NOTE — Progress Notes (Signed)
Family Medicine Teaching Service Daily Progress Note Intern Pager: [phone]  Patient name: Kylie [name] Medical record number: [id number] Date of birth: May 30, 1971 Age: 45 y.o. Gender: female  Primary Care Provider: Zigmund Gottron, MD Consultants:  Code Status: FULL    Pt Overview and Major Events to Date:  6/24 admitted with CC: SOB, treated for CAP on azithromycin and ceftriaxone 6/24-6/27 6/26 - transferred to CCU for hypoxic resp failure and AMS started on vanc 6/26-6/30 6/27 - zosyn, d/c'd 7/3 6/27 - OR for R VATS 6/29- QTC= 436 6/30 - QTC= 440 7/2 - extubated 7/3 transferred to FMTS and started on augmentin  Assessment and Plan:   Community acquired pneumonia complicated by acute hypoxic respiratory failure: with parapneumonic effusion s/p VATS 6/27 with chest tube removed 7/2.  Still requiring 3L via [place] and desatted with ambulation.  Considering discharging with home oxygen given her complicated course this admission.  Pt qualifies for home O2 will continue for several weeks upon discharge.  - extubated to [place] 7/2- wean o2 as tolerated -duoneb q6hr, pulmicort BID -augmentin day 4 plan for 10 days -repeast cxr -repeat CT scan 4-6 weeks - f/u blood cultures -will switch from neb to MDI for discharge  Hypokalemia:  Previous hypokalemic in CCU, appears to be resolved.  -monitor BMP   Abdominal pain: now resolved, without discomfort.  -diet advanced to full.  -pepcid PO Q12hr -ducolax PO daily -senna po BID  Normocytic anemia: mild and stable -assess ability to ambulate, otherwise continue with lovenox daily or SCDs  Hypothyroidism: stable -synthroid daily  anxiety and bipolar disease:   -continue home regimen -start vistaril and lithium -daily EKG to monitor QTC (seroquel and methadone)  chronic pain: receives methadone 120mg  from clinic -methadone, seroquel, baclofen   FEN/GI: Regular diet PPx: lovenox  Disposition: Discharge today with home O2 for  several weeks.  Subjective:  Patient feels better this AM.  She is still on 3L [place] satting at 96%.  Patient qualifies for home O2, desatted to 79% with ambulation. Tolerated full diet yesterday.   Objective: Temp:  [97.5 F (36.4 C)-98.8 F (37.1 C)] 97.5 F (36.4 C) (07/06 0547) Pulse Rate:  [85-91] 85 (07/06 0547) Resp:  [18] 18 (07/06 0547) BP: (110-118)/(62-68) 114/68 mmHg (07/06 0547) SpO2:  [95 %-100 %] 100 % (07/06 0547) Physical Exam: General: Well appearing, middle aged female in NAD Cardiovascular: RRR, s1/s2 present, no MRG appreciated Respiratory: CTABL Abdomen: soft, non-tender, non-distended.  Extremities: warm and well perfused, no cyanosis or edema MSK:  Wound on right side appears c/d and non-indurated.  Psych: normal mood and affect  Laboratory:  Recent Labs Lab 12/03/15 0538 12/04/15 0713 12/05/15 0609  WBC 9.2 8.5 8.8  HGB 11.0* 10.2* 10.7*  HCT 35.1* 33.0* 34.7*  PLT 520* 506* 559*    Recent Labs Lab 12/02/15 0239 12/03/15 0538 12/04/15 0713  NA 148*  148* 140 138  K 3.6  3.7 3.8 3.8  CL 96*  97* 93* 96*  CO2 34*  31 37* 34*  BUN 17  17 15 8   CREATININE 0.65  0.58 0.67 0.64  CALCIUM 10.0  9.9 9.3 9.1  GLUCOSE 101*  97 106* 111*     Imaging/Diagnostic Tests: Dg Chest 2 View  12/03/2015  CLINICAL DATA:  Shortness of breath.  Pneumonia.  Empyema. EXAM: CHEST  2 VIEW COMPARISON:  12/02/2015. FINDINGS: Normal cardiomediastinal silhouette. Improved aeration with clearing of bibasilar opacities, with some persistent airspace disease on the RIGHT. Stable RIGHT  CP angle blunting. No osseous findings. IMPRESSION: Improved aeration.  Persistent opacity RIGHT base. Electronically Signed   By: Staci Righter M.D.   On: 12/03/2015 08:41   Dg Chest Port 1 View  12/02/2015  CLINICAL DATA:  Pneumonia EXAM: PORTABLE CHEST 1 VIEW FINDINGS: The endotracheal to, NG and right IJ catheters have been removed. Persistent asymmetric interstitial and airspace  process in the lungs. No large pleural effusion or pneumothorax. IMPRESSION: Removal of support apparatus. Persistent interstitial and airspace process in the lungs. Electronically Signed   By: Marijo Sanes M.D.   On: 12/02/2015 07:17    Eloise Levels, MD 12/05/2015, 7:19 AM PGY-1, [place] Intern pager: [phone], text pages welcome

## 2015-12-05 NOTE — Progress Notes (Signed)
SATURATION QUALIFICATIONS: (This note is used to comply with regulatory documentation for home oxygen)  Patient Saturations on Room Air at Rest = 99%   Patient Saturations on Room Air while Ambulating = 84%   Patient Saturations on 2 Liters of oxygen while Ambulating = 94%  Please briefly explain why patient needs home oxygen: Pt has respiratory failure.

## 2015-12-05 NOTE — Progress Notes (Signed)
NURSING PROGRESS NOTE  Kylie [name] [id number] Discharge Data: 12/05/2015 7:39 PM Attending Provider: No att. providers found [id number] Arnell Sieving, MD     Bobbye Riggs to be D/C'd Home per MD order.  Discussed with the patient the After Visit Summary and all questions fully answered. All IV's discontinued with no bleeding noted. All belongings returned to patient for patient to take home.   Last Vital Signs:  Blood pressure 103/57, pulse 67, temperature 98 F (36.7 C), temperature source Oral, resp. rate 16, height 5\' 7"  (1.702 m), weight 93.305 kg (205 lb 11.2 oz), last menstrual period 11/23/2015, SpO2 94 %.  Discharge Medication List   Medication List    TAKE these medications        amoxicillin-clavulanate 875-125 MG tablet  Commonly known as:  AUGMENTIN  Take 1 tablet by mouth every 12 (twelve) hours.     baclofen 10 MG tablet  Commonly known as:  LIORESAL  Take 0.5 tablets (5 mg total) by mouth 3 (three) times daily.     gabapentin 300 MG capsule  Commonly known as:  NEURONTIN  Take 1 capsule (300 mg total) by mouth 3 (three) times daily.     hydrOXYzine 25 MG capsule  Commonly known as:  VISTARIL  Take 1 capsule (25 mg total) by mouth 3 (three) times daily as needed for anxiety.     levothyroxine 50 MCG tablet  Commonly known as:  SYNTHROID, LEVOTHROID  Take 1 tablet (50 mcg total) by mouth daily.     lithium 300 MG tablet  Take 1 tablet (300 mg total) by mouth 2 (two) times daily.     methadone 10 MG tablet  Commonly known as:  DOLOPHINE  Take 120 mg by mouth daily.     QUEtiapine 300 MG 24 hr tablet  Commonly known as:  SEROQUEL XR  Take 1 tablet (300 mg total) by mouth at bedtime.

## 2015-12-05 NOTE — Progress Notes (Signed)
2 sutures removed from R lateral chest s/p chest tube removal. Site was cleaned and intact. No signs of infection. Pt tolerated well.

## 2015-12-05 NOTE — Progress Notes (Signed)
9 Days Post-Op Procedure(s) (LRB): VIDEO ASSISTED THORACOSCOPY (VATS)/EMPYEMA (Right) VIDEO BRONCHOSCOPY (N/A) Subjective: Doing well after right VATS drainage of empyema and decortication of lung Final operative cultures are no growth Patient doing well on oral Augmentin Surgical incisions healing well Patient on tramadol and 1 daily dose of methadone with increased postthoracotomy pain as she becomes more active We'll give 1 fentanyl patch for 72 hours then DC  Objective: Vital signs in last 24 hours: Temp:  [97.5 F (36.4 C)-98.8 F (37.1 C)] 97.5 F (36.4 C) (07/06 0547) Pulse Rate:  [85-91] 85 (07/06 0547) Cardiac Rhythm:  [-] Normal sinus rhythm (07/06 0727) Resp:  [18] 18 (07/06 0547) BP: (110-118)/(62-68) 114/68 mmHg (07/06 0547) SpO2:  [97 %-100 %] 100 % (07/06 0547)  Hemodynamic parameters for last 24 hours:  stable still weaning oxygen  Intake/Output from previous day: 07/05 0701 - 07/06 0700 In: 720 [P.O.:720] Out: 1800 [Urine:1800] Intake/Output this shift:   Exam Alert and comfortable Good breath sounds bilaterally Surgical incisions healing well 2 chest tube sutures remain   Lab Results:  Recent Labs  12/04/15 0713 12/05/15 0609  WBC 8.5 8.8  HGB 10.2* 10.7*  HCT 33.0* 34.7*  PLT 506* 559*   BMET:  Recent Labs  12/04/15 0713 12/05/15 0609  NA 138 137  K 3.8 4.2  CL 96* 100*  CO2 34* 26  GLUCOSE 111* 100*  BUN 8 8  CREATININE 0.64 0.59  CALCIUM 9.1 9.1    PT/INR: No results for input(s): LABPROT, INR in the last 72 hours. ABG    Component Value Date/Time   PHART 7.414 11/27/2015 0445   HCO3 31.3* 11/27/2015 0445   TCO2 32.8 11/27/2015 0445   ACIDBASEDEF 4.0* 08/26/2012 1613   O2SAT 97.6 11/27/2015 0445   CBG (last 3)  No results for input(s): GLUCAP in the last 72 hours.  Assessment/Plan: S/PRight VATS for empyema We'll follow-up patient in office with chest x-ray at discharge Continue oral Augmentin for 10 days after  discharge   LOS: 12 days    Tharon Aquas Trigt III 12/05/2015

## 2015-12-05 NOTE — Discharge Instructions (Signed)
You were admitted to the hospital for treatment of a pneumonia and had a complication of the pneumonia that required a thoracotomy and you spent time time in the critical care unit for extra treatment.  You had to be intubated for some time and you have since been intubated and have improved.  We are comfortable discharging you now, however you are still requiring some oxygen to keep your oxygen saturation up to appropriate levels.  Therefore, we are sending you home with oxygen that we recommend you use for a few weeks until you follow up with your primary doctor.  We have scheduled a follow up appointment for you on 12/10/15 at 10:45AM with Dr. Juleen China at the [facility].       Thoracotomy, Care After Refer to this sheet in the next few weeks. These instructions provide you with information on caring for yourself after your procedure. Your health care provider may also give you more specific instructions. Your treatment has been planned according to current medical practices, but problems sometimes occur. Call your health care provider if you have any problems or questions after your procedure. WHAT TO EXPECT AFTER YOUR PROCEDURE After your procedure, it is typical to have the following sensations:  You may feel pain at the incision site.  You may be constipated from the pain medicine given and the change in your level of activity.  You may feel extremely tired. HOME CARE INSTRUCTIONS  Take over-the-counter or prescription medicines for pain, discomfort, or fever only as directed by your health care provider. It is very important to take pain relieving medicine before your pain becomes severe. You will be able to breathe and cough more comfortably if your pain is well controlled.  Take deep breaths. Deep breathing helps to keep your lungs inflated and protects against a lung infection (pneumonia).  Cough frequently. Even though coughing may cause discomfort, coughing is important to  clear mucus (phlegm) and expand your lungs. Coughing helps prevent pneumonia. If it hurts to cough, hold a pillow against your chest when you cough. This may help with the discomfort.  Continue to use an incentive spirometer as directed. The use of an incentive spirometer helps to keep your lungs inflated and protects against pneumonia.  Change the bandages over your incision as needed or as directed by your health care provider.  Remove the bandages over your chest tube site as directed by your health care provider.  Resume your normal diet as directed. It is important to have adequate protein, calories, vitamins, and minerals to promote healing.  Prevent constipation.  Eat high-fiber foods such as whole grain cereals and breads, brown rice, beans, and fresh fruits and vegetables.  Drink enough water and fluids to keep your urine clear or pale yellow. Avoid drinking beverages containing caffeine. Beverages containing caffeine can cause dehydration and harden your stool.  Talk to your health care provider about taking a stool softener or laxative.  Avoid lifting until you are instructed otherwise.  Do not drive until directed by your health care provider.  Do not drive while taking pain medicines (narcotics).  Do not bathe, swim, or use a hot tub until directed by your health care provider. You may shower instead. Gently wash the area of your incision with water and soap as directed. Do not use anything else to clean your incision except as directed by your health care provider.  Do not use any tobacco products including cigarettes, chewing tobacco, or electronic cigarettes.  Avoid  secondhand smoke.  Schedule an appointment for stitch (suture) or staple removal as directed.  Schedule and attend all follow-up visits as directed by your health care provider. It is important to keep all your appointments.  Participate in pulmonary rehabilitation as directed by your health care  provider.  Do not travel by airplane for 2 weeks after your chest tube is removed. SEEK MEDICAL CARE IF:  You are bleeding from your wounds.  Your heartbeat seems irregular.  You have redness, swelling, or increasing pain in the wounds.  There is pus coming from your wounds.  There is a bad smell coming from the wound or dressing.  You have a fever or chills.  You have nausea or are vomiting.  You have muscle aches. SEEK IMMEDIATE MEDICAL CARE IF:  You have a rash.  You have difficulty breathing.  You have a reaction or side effect to medicines given.  You have persistent nausea.  You have lightheadedness or feel faint.  You have shortness of breath or chest pain.  You have persistent pain.   This information is not intended to replace advice given to you by your health care provider. Make sure you discuss any questions you have with your health care provider.   Document Released: 10/31/2010 Document Revised: 10/02/2014 Document Reviewed: 01/04/2013 Elsevier Interactive Patient Education [employer].

## 2015-12-06 ENCOUNTER — Other Ambulatory Visit: Payer: Self-pay | Admitting: Family Medicine

## 2015-12-06 MED ORDER — NICOTINE 14 MG/24HR TD PT24: 14.0000 mg | MEDICATED_PATCH | Freq: Every day | TRANSDERMAL | Status: DC

## 2015-12-06 NOTE — Telephone Encounter (Signed)
Pt called because she needs a refill on her nicotine patches. jw

## 2015-12-10 ENCOUNTER — Inpatient Hospital Stay: Payer: Self-pay | Admitting: Internal Medicine

## 2015-12-10 ENCOUNTER — Other Ambulatory Visit: Payer: Self-pay | Admitting: Cardiothoracic Surgery

## 2015-12-10 DIAGNOSIS — J869 Pyothorax without fistula: Secondary | ICD-10-CM

## 2015-12-11 ENCOUNTER — Ambulatory Visit: Payer: Self-pay | Admitting: Cardiothoracic Surgery

## 2015-12-11 ENCOUNTER — Ambulatory Visit: Payer: Self-pay | Admitting: Internal Medicine

## 2015-12-16 ENCOUNTER — Encounter: Payer: Self-pay | Admitting: Internal Medicine

## 2015-12-16 ENCOUNTER — Ambulatory Visit (INDEPENDENT_AMBULATORY_CARE_PROVIDER_SITE_OTHER): Payer: Medicaid Other | Admitting: Internal Medicine

## 2015-12-16 VITALS — BP 87/27 | HR 88 | Temp 98.3°F | Ht 67.0 in | Wt 209.0 lb

## 2015-12-16 DIAGNOSIS — M7989 Other specified soft tissue disorders: Secondary | ICD-10-CM | POA: Diagnosis present

## 2015-12-16 NOTE — Assessment & Plan Note (Addendum)
Concern for DVT as cause of patient's left LE edema. Well's DVT score of 3 for recent immobilization/procedure, pitting edema confined to symptomatic leg, and calf swelling greater than other leg. Patient is stable from respiratory standpoint with good O2 saturation and no history or exam findings concerning for PE. If DVT ruled out, swelling could be related to IVF use during hospitalization (although this was some time ago and would expect more uniform swelling). May need further investigation of anemia or hypoalbuminemia as cause of swelling. Patient with only Grade I Diastolic Dysfunction. Recent TSH, liver function, and kidney functions tests (obtained during recent hospitalization) have been normal.  -LE doppler scheduled  -return precautions discussed (patient left prior to AVS)

## 2015-12-16 NOTE — Progress Notes (Signed)
Subjective:    Kylie [name] - 45 y.o. female MRN [id number]  Date of birth: Jul 10, 1970  HPI  Kylie [name] is here for SDA for unilateral leg swelling.  Leg Swelling: On left lower extremity extending to knee. Has been present since hospitalization and has not improved. Was discharged about 2.5 weeks ago and is s/p VATS procedure and intubation for approximately 6 days. Denies associated pain with swelling. No pain with ambulation. Was discharged with home O2 but reports that she is not needing it much at home right now. Denies SOB and sensation of racing heart. No new cough and no hemoptysis.   -  reports that she has quit smoking. Her smoking use included Cigarettes. She has a 25 pack-year smoking history. She has never used smokeless tobacco. - Review of Systems: Per HPI. - Past Medical History: Patient Active Problem List   Diagnosis Date Noted  . Swelling of left lower extremity 12/16/2015  . Acute hypoxemic respiratory failure ([place]) 12/05/2015  . Generalized anxiety disorder   . Chest tube in place   . Dyspnea   . SOB (shortness of breath)   . Empyema lung ([place])   . Empyema ([place])   . Acute respiratory failure ([place])   . Mediastinal lymphadenopathy   . Atypical chest pain   . AP (abdominal pain)   . Bipolar I disorder ([place])   . Other specified hypothyroidism   . Chronic pain syndrome   . Acute on chronic respiratory failure ([place])   . Pleural effusion   . CAP (community acquired pneumonia) 11/23/2015  . History of abnormal cervical Pap smear 10/03/2015  . Urinary, incontinence, stress female 10/02/2015  . Major depressive disorder, recurrent episode, moderate ([place])   . Major depression, recurrent ([place]) 04/06/2014  . 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 Suicide attempt ([place])   . Hip pain, left 08/14/2011  . PAP SMER W/ATYPCL SQAUMOUS CELL NOT EXCLD HI GRD 04/09/2010  . HEPATITIS B 01/31/2008  . Drug abuse in  remission 04/04/2007  . OBESITY, NOS 07/29/2006  . ANXIETY 07/29/2006  . TOBACCO DEPENDENCE 07/29/2006  . Bipolar I disorder, most recent episode mixed ([place]) 07/29/2006  . Chronic bilateral low back pain without sciatica 07/29/2006   - Medications: reviewed and updated    Objective:   Physical Exam BP 87/27 mmHg  Pulse 88  Temp(Src) 98.3 F (36.8 C) (Oral)  Ht 5\' 7"  (1.702 m)  Wt 209 lb (94.802 kg)  BMI 32.73 kg/m2  SpO2 95%  LMP 11/23/2015 Gen: NAD, alert, cooperative with exam, well-appearing CV: RRR, good S1/S2, no murmur Resp: CTABL, no wheezes, non-labored Ext: Left calf measures 39.5 cm and right calf measures 36 cm. No TTP, increased warmth, or palpable cord in either calf. Negative Homan's sign bilaterally. 2+ pitting edema in left foot and ankle. 1+ pitting edema to left knee.      Assessment & Plan:   Swelling of left lower extremity Concern for DVT as cause of patient's left LE edema. Well's DVT score of 3 for recent immobilization/procedure, pitting edema confined to symptomatic leg, and calf swelling greater than other leg. Patient is stable from respiratory standpoint with good O2 saturation and no history or exam findings concerning for PE. If DVT ruled out, swelling could be related to IVF use during hospitalization (although this was some time ago and would expect more uniform swelling). May need further investigation of anemia or hypoalbuminemia as cause of swelling. Patient with  only Grade I Diastolic Dysfunction. Recent TSH, liver function, and kidney functions tests (obtained during recent hospitalization) have been normal.  -LE doppler scheduled  -return precautions discussed (patient left prior to AVS)      Phill Myron, D.O. 12/16/2015, 5:25 PM PGY-2, [place]

## 2015-12-17 ENCOUNTER — Inpatient Hospital Stay (HOSPITAL_COMMUNITY): Admission: RE | Admit: 2015-12-17 | Payer: Self-pay | Source: Ambulatory Visit

## 2015-12-18 ENCOUNTER — Ambulatory Visit: Payer: Self-pay | Admitting: Cardiothoracic Surgery

## 2015-12-25 LAB — FUNGUS CULTURE WITH STAIN

## 2015-12-25 LAB — FUNGAL ORGANISM REFLEX

## 2015-12-25 LAB — FUNGUS CULTURE RESULT

## 2016-01-01 ENCOUNTER — Ambulatory Visit: Payer: Self-pay | Admitting: Family Medicine

## 2016-01-09 LAB — ACID FAST CULTURE WITH REFLEXED SENSITIVITIES (MYCOBACTERIA): Acid Fast Culture: NEGATIVE

## 2016-01-15 ENCOUNTER — Telehealth: Payer: Self-pay | Admitting: Family Medicine

## 2016-01-15 NOTE — Telephone Encounter (Signed)
Pt would like her  Flexerill refilled. She has tried baclofen but doesn't like it .  She uses [facility]

## 2016-01-15 NOTE — Telephone Encounter (Signed)
Patient needs hospital follow up appointment with me.  Will discuss med changes during that visit.  Continue current meds until seen.

## 2016-01-16 NOTE — Telephone Encounter (Signed)
LVM for pt to call office. If she calls, please make her an appt to see Dr Andria Frames for medication changes. Ottis Stain, CMA

## 2016-01-23 ENCOUNTER — Ambulatory Visit (INDEPENDENT_AMBULATORY_CARE_PROVIDER_SITE_OTHER): Payer: Medicaid Other | Admitting: Family Medicine

## 2016-01-23 ENCOUNTER — Telehealth: Payer: Self-pay | Admitting: *Deleted

## 2016-01-23 ENCOUNTER — Encounter: Payer: Self-pay | Admitting: Family Medicine

## 2016-01-23 DIAGNOSIS — M545 Low back pain, unspecified: Secondary | ICD-10-CM

## 2016-01-23 DIAGNOSIS — Z23 Encounter for immunization: Secondary | ICD-10-CM | POA: Diagnosis not present

## 2016-01-23 DIAGNOSIS — J869 Pyothorax without fistula: Secondary | ICD-10-CM

## 2016-01-23 DIAGNOSIS — G8929 Other chronic pain: Secondary | ICD-10-CM | POA: Diagnosis not present

## 2016-01-23 DIAGNOSIS — K5900 Constipation, unspecified: Secondary | ICD-10-CM

## 2016-01-23 DIAGNOSIS — F3160 Bipolar disorder, current episode mixed, unspecified: Secondary | ICD-10-CM

## 2016-01-23 MED ORDER — POLYETHYLENE GLYCOL 3350 17 G PO PACK: 17.0000 g | PACK | Freq: Two times a day (BID) | ORAL | 12 refills | Status: AC

## 2016-01-23 MED ORDER — CYCLOBENZAPRINE HCL 10 MG PO TABS: 10.0000 mg | ORAL_TABLET | Freq: Three times a day (TID) | ORAL | 12 refills | Status: DC

## 2016-01-23 NOTE — Telephone Encounter (Signed)
Has appt with Dr. Andria Frames on 01/23/16. Ottis Stain, CMA

## 2016-01-23 NOTE — Telephone Encounter (Signed)
Patient called back after leaving her visit today and states that she some additional questions to ask provider. Madesyn Ast,CMA

## 2016-01-23 NOTE — Patient Instructions (Signed)
I sent in two prescriptions.   I will call with the lithium level See me in 3 months. Quit smoking, stay clean and sober.

## 2016-01-23 NOTE — Telephone Encounter (Signed)
Attempted to call, no answer.

## 2016-01-24 LAB — LITHIUM LEVEL: Lithium Lvl: 0.4 mmol/L — ABNORMAL LOW (ref 0.6–1.2)

## 2016-01-24 NOTE — Assessment & Plan Note (Signed)
Switch back to flexeril.

## 2016-01-24 NOTE — Progress Notes (Signed)
   Subjective:    Patient ID: Kylie [name], female    DOB: 1970/12/23, 45 y.o.   MRN: [id number]  HPI  Hosp follow up, pneumonia.  Unfortunately, she has resumed smoking.   Her main complaint is that she prefers to go back on flexeril, baclofen is not as effective for her.  Remains on methadone. She is slowly regaining strength since hospitalization.      Review of Systems     Objective:   Physical Exam Lungs clear.        Assessment & Plan:

## 2016-01-24 NOTE — Assessment & Plan Note (Signed)
Seems to be recovering without incident.

## 2016-05-01 ENCOUNTER — Ambulatory Visit: Payer: Self-pay | Admitting: Cardiothoracic Surgery

## 2016-05-04 ENCOUNTER — Telehealth: Payer: Self-pay | Admitting: *Deleted

## 2016-05-04 ENCOUNTER — Telehealth: Payer: Self-pay | Admitting: Family Medicine

## 2016-05-04 DIAGNOSIS — F3160 Bipolar disorder, current episode mixed, unspecified: Secondary | ICD-10-CM

## 2016-05-04 MED ORDER — LITHIUM CARBONATE 300 MG PO TABS: 300.0000 mg | ORAL_TABLET | Freq: Two times a day (BID) | ORAL | 5 refills | Status: DC

## 2016-05-04 MED ORDER — QUETIAPINE FUMARATE ER 300 MG PO TB24: 300.0000 mg | ORAL_TABLET | Freq: Every day | ORAL | 5 refills | Status: DC

## 2016-05-04 NOTE — Telephone Encounter (Signed)
Pt has called 3 times today about refill on her medicaitons. She also said dr Andria Frames called her last week and told her her lithum levels are too low.  Please advise

## 2016-05-04 NOTE — Telephone Encounter (Signed)
Pt is calling because her pharmacy told her that they have faxed [place] over 5 times and we are not responding to their refill request. She needs a refill on her Seroquel and Lithium. jw

## 2016-05-05 NOTE — Telephone Encounter (Signed)
Called and LM.  I have approved electronic refill requests from the pharmacy.  If she needs anything else, asked to call back with new message.

## 2016-06-11 ENCOUNTER — Ambulatory Visit: Payer: Self-pay | Admitting: Family Medicine

## 2016-07-09 ENCOUNTER — Telehealth: Payer: Self-pay | Admitting: *Deleted

## 2016-07-09 NOTE — Telephone Encounter (Signed)
Change not appropriate.

## 2016-07-09 NOTE — Telephone Encounter (Signed)
Received fax from [facility] requesting to change muscle relaxant to metaxalone.  Fax placed in provider box for review.  Derl Barrow, RN

## 2016-07-13 ENCOUNTER — Telehealth: Payer: Self-pay | Admitting: Family Medicine

## 2016-07-13 NOTE — Telephone Encounter (Signed)
Pt states she is having serve back pain. Pt scheduled an appointment for 2-15 to see Dr. Andria Frames. Pt states she is taking 120mg  of methadone, pt states that doesn't touch the pain. Pt would like Dr. Andria Frames to call her. ep

## 2016-07-16 ENCOUNTER — Ambulatory Visit: Payer: Self-pay | Admitting: Family Medicine

## 2016-07-16 NOTE — Telephone Encounter (Signed)
Patient did not keep appointment.  Given her history of polysubstance abuse, I am limited in treatment options.  Physical therapy is an option.

## 2016-09-02 ENCOUNTER — Telehealth: Payer: Self-pay | Admitting: *Deleted

## 2016-09-02 NOTE — Telephone Encounter (Signed)
Kylie [name] was seen by Dr. Prescott Gum in June 2017 for a right empyema resulting in surgery for drainage. She has called today to relate that she has been coughing up blood x 1 week and wants to see Dr. Prescott Gum. I explained to her that she needs to either go to the ED or preferably see her PCP to determine the cause of her hemoptysis. She agreed. Of note, she never did come for a f/u apt. with Dr. Prescott Gum after her thoracic surgery. She cancelled many times due to transportation issues and never called to reschedule as she agreed to.

## 2016-09-03 ENCOUNTER — Telehealth: Payer: Self-pay | Admitting: Family Medicine

## 2016-09-03 NOTE — Telephone Encounter (Signed)
woulld like Dr Andria Frames to call her.  She just isnt feeling good and the last four days she has been spitting up blood. She wants to see only dr Andria Frames. She has been constipated for about 2 weeks.

## 2016-09-04 NOTE — Telephone Encounter (Signed)
Called x 2 and went to voice mail.  Second time I left a message suggesting miralax for constipation and to call back if still needs to talk.  I am closing this note.

## 2016-09-28 ENCOUNTER — Telehealth: Payer: Self-pay | Admitting: Family Medicine

## 2016-09-28 DIAGNOSIS — Z87898 Personal history of other specified conditions: Secondary | ICD-10-CM

## 2016-09-28 NOTE — Telephone Encounter (Signed)
Pt states she was supposed to go to [facility] for her breast. Pt canceled appointment and called back today to reschedule, but was told she needs another referral. Pt states one of the things they were supposed to do is an hour long test. DQVHQI [phone]. ep

## 2016-09-29 DIAGNOSIS — Z87898 Personal history of other specified conditions: Secondary | ICD-10-CM | POA: Insufficient documentation

## 2016-09-29 NOTE — Telephone Encounter (Signed)
See problem, Hx of abnormal mammo.  Discussed with patient.  Will order screening mammo now.

## 2016-09-29 NOTE — Assessment & Plan Note (Signed)
Given three year time lapse and that patient is currently asymptomatic, will start with screening mammogram.

## 2016-10-05 NOTE — Telephone Encounter (Signed)
Contacted pt to let her know that the order has been placed and that as long as she is not having any problems she should be able to call and set up her screening mammogram.  Told pt that if she had any problems to please give [place] a call back. Katharina Caper, Sander Remedios D, [place]

## 2016-10-08 ENCOUNTER — Ambulatory Visit: Payer: Medicaid Other | Admitting: Family Medicine

## 2016-10-14 ENCOUNTER — Ambulatory Visit (INDEPENDENT_AMBULATORY_CARE_PROVIDER_SITE_OTHER): Payer: Medicaid Other | Admitting: Family Medicine

## 2016-10-14 ENCOUNTER — Encounter: Payer: Self-pay | Admitting: Family Medicine

## 2016-10-14 ENCOUNTER — Other Ambulatory Visit: Payer: Self-pay | Admitting: Family Medicine

## 2016-10-14 DIAGNOSIS — E038 Other specified hypothyroidism: Secondary | ICD-10-CM | POA: Diagnosis not present

## 2016-10-14 DIAGNOSIS — F17200 Nicotine dependence, unspecified, uncomplicated: Secondary | ICD-10-CM

## 2016-10-14 DIAGNOSIS — K5900 Constipation, unspecified: Secondary | ICD-10-CM

## 2016-10-14 DIAGNOSIS — R87611 Atypical squamous cells cannot exclude high grade squamous intraepithelial lesion on cytologic smear of cervix (ASC-H): Secondary | ICD-10-CM | POA: Diagnosis not present

## 2016-10-14 DIAGNOSIS — E669 Obesity, unspecified: Secondary | ICD-10-CM

## 2016-10-14 DIAGNOSIS — R928 Other abnormal and inconclusive findings on diagnostic imaging of breast: Secondary | ICD-10-CM

## 2016-10-14 DIAGNOSIS — Z87898 Personal history of other specified conditions: Secondary | ICD-10-CM

## 2016-10-14 DIAGNOSIS — F1911 Other psychoactive substance abuse, in remission: Secondary | ICD-10-CM

## 2016-10-14 DIAGNOSIS — R739 Hyperglycemia, unspecified: Secondary | ICD-10-CM | POA: Diagnosis not present

## 2016-10-14 DIAGNOSIS — F3160 Bipolar disorder, current episode mixed, unspecified: Secondary | ICD-10-CM

## 2016-10-14 DIAGNOSIS — E781 Pure hyperglyceridemia: Secondary | ICD-10-CM | POA: Insufficient documentation

## 2016-10-14 LAB — POCT GLYCOSYLATED HEMOGLOBIN (HGB A1C): HEMOGLOBIN A1C: 5.6

## 2016-10-14 MED ORDER — NICOTINE 21 MG/24HR TD PT24: 21.0000 mg | MEDICATED_PATCH | Freq: Every day | TRANSDERMAL | 1 refills | Status: AC

## 2016-10-14 NOTE — Assessment & Plan Note (Signed)
Reorder patches.

## 2016-10-14 NOTE — Assessment & Plan Note (Signed)
Check lipid panel  

## 2016-10-14 NOTE — Assessment & Plan Note (Signed)
reoder mammogram

## 2016-10-14 NOTE — Assessment & Plan Note (Signed)
Remains on methadone.

## 2016-10-14 NOTE — Assessment & Plan Note (Signed)
Check lithium level.  Patient feels bipolar not under control.  Last level low.  May need to increase dose.

## 2016-10-14 NOTE — Assessment & Plan Note (Signed)
Check for DM.  Diet and exercise.

## 2016-10-14 NOTE — Assessment & Plan Note (Signed)
She will call with name of med that worked.

## 2016-10-14 NOTE — Assessment & Plan Note (Signed)
Check for DM 

## 2016-10-14 NOTE — Assessment & Plan Note (Signed)
Check tsh 

## 2016-10-14 NOTE — Patient Instructions (Signed)
I am proud of the way you have begun to turn your life around. Now is the time to focus on your physical health.  Weight loss and smoking cessation are the two, big, important things you can do.  I sent in a script with new patches. Get your mammogram done.   I will call with lab results.  Call me with the name of the constipation medication See me in one month to check on how things are turning around.

## 2016-10-14 NOTE — Assessment & Plan Note (Signed)
q3y paps.

## 2016-10-14 NOTE — Progress Notes (Signed)
   Subjective:    Patient ID: Kylie [name], female    DOB: 1970-07-28, 46 y.o.   MRN: [id number]  HPI Multiple problems: 1. Obesity.  Has gained 25 lbs in last 9 months.  Not active.She has family hx of dm and has had previously elevated BS 2. Hx of abnormal paps.  She had remote abnormal paps.  Review of last two both normal.  Monagamous.  Will change to q3y paps. 3. Hypothyroid, presumably secondary to lithium   4. Hx of increase triglycerides. 5. Hx of abnormal mammogram. Never followed. 6. Severe opioid induced constipation.  Miralax "not working"  She meant to give me the name of something she tried that did work 7. Bipolar, largely in check.  Due for lithium level 8. Wants patch again to quit smoking.    Review of Systems     Objective:   Physical Exam Lungs clear Cardiac RRR without m or g abd benign without tenderness. Mood appropriate.       Assessment & Plan:

## 2016-10-15 ENCOUNTER — Other Ambulatory Visit: Payer: Self-pay | Admitting: *Deleted

## 2016-10-15 ENCOUNTER — Telehealth: Payer: Self-pay | Admitting: Family Medicine

## 2016-10-15 LAB — SPECIMEN STATUS

## 2016-10-15 MED ORDER — QUETIAPINE FUMARATE ER 300 MG PO TB24: 300.0000 mg | ORAL_TABLET | Freq: Every day | ORAL | 3 refills | Status: DC

## 2016-10-15 MED ORDER — LACTULOSE 10 G PO PACK: 10.0000 g | PACK | Freq: Three times a day (TID) | ORAL | 3 refills | Status: AC

## 2016-10-15 NOTE — Telephone Encounter (Signed)
Pt was told to call with the name of a laxative medication she wanted to use. Contstulose Lactulose solution taken orally. Pt uses [facility]. ep

## 2016-10-15 NOTE — Telephone Encounter (Signed)
Spoke to patient and Rx provided.

## 2016-10-15 NOTE — Progress Notes (Signed)
Prior Authorization received from [facility] for Seroquel XR. Formulary preferred is the generic Quetiapine XR.  Generic form reordered per Dr. Andria Frames.   Derl Barrow, RN

## 2016-10-16 LAB — CMP14+EGFR
A/G RATIO: 1.5 (ref 1.2–2.2)
ALT: 13 IU/L (ref 0–32)
AST: 15 IU/L (ref 0–40)
Albumin: 4.4 g/dL (ref 3.5–5.5)
Alkaline Phosphatase: 100 IU/L (ref 39–117)
BUN/Creatinine Ratio: 11 (ref 9–23)
BUN: 8 mg/dL (ref 6–24)
Bilirubin Total: 0.3 mg/dL (ref 0.0–1.2)
CALCIUM: 9.6 mg/dL (ref 8.7–10.2)
CO2: 21 mmol/L (ref 18–29)
CREATININE: 0.76 mg/dL (ref 0.57–1.00)
Chloride: 103 mmol/L (ref 96–106)
GFR, EST AFRICAN AMERICAN: 110 mL/min/{1.73_m2} (ref >59)
GFR, EST NON AFRICAN AMERICAN: 96 mL/min/{1.73_m2} (ref >59)
GLUCOSE: 112 mg/dL — AB (ref 65–99)
Globulin, Total: 2.9 g/dL (ref 1.5–4.5)
Potassium: 4.7 mmol/L (ref 3.5–5.2)
Sodium: 143 mmol/L (ref 134–144)
TOTAL PROTEIN: 7.3 g/dL (ref 6.0–8.5)

## 2016-10-16 LAB — LIPID PANEL
Chol/HDL Ratio: 5.2 ratio — ABNORMAL HIGH (ref 0.0–4.4)
Cholesterol, Total: 197 mg/dL (ref 100–199)
HDL: 38 mg/dL — ABNORMAL LOW (ref >39)
LDL Calculated: 132 mg/dL — ABNORMAL HIGH (ref 0–99)
TRIGLYCERIDES: 137 mg/dL (ref 0–149)
VLDL Cholesterol Cal: 27 mg/dL (ref 5–40)

## 2016-10-16 LAB — LITHIUM LEVEL: LITHIUM LVL: 0.1 mmol/L — AB (ref 0.6–1.2)

## 2016-10-16 LAB — TSH: TSH: 1.67 u[IU]/mL (ref 0.450–4.500)

## 2016-11-02 ENCOUNTER — Telehealth: Payer: Self-pay | Admitting: Family Medicine

## 2016-11-02 NOTE — Telephone Encounter (Signed)
Pt says the medication lactulose is not working.  She had taken some her friend had and it was liquid. The one the pt has is a powder. Once she mixes it up, it is the same as miralax and that doesn't work. Pt feels bad, almost like she has the flu. Please advise

## 2016-11-03 NOTE — Telephone Encounter (Signed)
2nd request. Pt declined appointment and wants to speak with Dr. Andria Frames. ep

## 2016-11-05 NOTE — Telephone Encounter (Signed)
Called and LM to double dose of lactulose.  Also instructed to call back if questions.

## 2016-11-20 ENCOUNTER — Other Ambulatory Visit: Payer: Self-pay | Admitting: Family Medicine

## 2016-11-20 DIAGNOSIS — M545 Low back pain, unspecified: Secondary | ICD-10-CM

## 2016-11-20 DIAGNOSIS — G8929 Other chronic pain: Secondary | ICD-10-CM

## 2016-12-10 ENCOUNTER — Emergency Department (HOSPITAL_BASED_OUTPATIENT_CLINIC_OR_DEPARTMENT_OTHER)
Admission: EM | Admit: 2016-12-10 | Discharge: 2016-12-10 | Disposition: A | Discharge status: Home / Self Care | Payer: Medicaid Other | Attending: Emergency Medicine | Admitting: Emergency Medicine

## 2016-12-10 ENCOUNTER — Encounter (HOSPITAL_BASED_OUTPATIENT_CLINIC_OR_DEPARTMENT_OTHER): Payer: Self-pay | Admitting: *Deleted

## 2016-12-10 DIAGNOSIS — Z87891 Personal history of nicotine dependence: Secondary | ICD-10-CM | POA: Insufficient documentation

## 2016-12-10 DIAGNOSIS — G894 Chronic pain syndrome: Secondary | ICD-10-CM | POA: Insufficient documentation

## 2016-12-10 DIAGNOSIS — Z8541 Personal history of malignant neoplasm of cervix uteri: Secondary | ICD-10-CM | POA: Diagnosis not present

## 2016-12-10 DIAGNOSIS — N390 Urinary tract infection, site not specified: Secondary | ICD-10-CM | POA: Insufficient documentation

## 2016-12-10 DIAGNOSIS — M545 Low back pain, unspecified: Secondary | ICD-10-CM

## 2016-12-10 DIAGNOSIS — Z79899 Other long term (current) drug therapy: Secondary | ICD-10-CM | POA: Diagnosis not present

## 2016-12-10 LAB — URINALYSIS, ROUTINE W REFLEX MICROSCOPIC
GLUCOSE, UA: NEGATIVE mg/dL
Ketones, ur: 15 mg/dL — AB
Nitrite: POSITIVE — AB
PROTEIN: NEGATIVE mg/dL
SPECIFIC GRAVITY, URINE: 1.029 (ref 1.005–1.030)
pH: 6 (ref 5.0–8.0)

## 2016-12-10 LAB — URINALYSIS, MICROSCOPIC (REFLEX)

## 2016-12-10 LAB — PREGNANCY, URINE: Preg Test, Ur: NEGATIVE

## 2016-12-10 MED ORDER — CEPHALEXIN 500 MG PO CAPS: 500.0000 mg | ORAL_CAPSULE | Freq: Four times a day (QID) | ORAL | 0 refills | Status: AC

## 2016-12-10 MED ORDER — IBUPROFEN 600 MG PO TABS: 600.0000 mg | ORAL_TABLET | Freq: Four times a day (QID) | ORAL | 0 refills | Status: AC | PRN

## 2016-12-10 MED ORDER — CEPHALEXIN 250 MG PO CAPS
500.0000 mg | ORAL_CAPSULE | Freq: Once | ORAL | Status: AC
  Administered 2016-12-10: 500 mg via ORAL
  Filled 2016-12-10: qty 2

## 2016-12-10 MED FILL — IBUPROFEN 600 MG TABLET: 600 | 8 days supply | Qty: 30 | Fill #0

## 2016-12-10 MED FILL — CEPHALEXIN 500 MG CAPSULE: 500 | 10 days supply | Qty: 40 | Fill #0

## 2016-12-10 NOTE — ED Triage Notes (Signed)
Pt reports 2 months of low back pain, denies any injury or trauma, states she did not go to her pcp because she knew they couldn't do xrays there.

## 2016-12-10 NOTE — Discharge Instructions (Addendum)
Please read and follow all provided instructions.  Your diagnoses today include:  1. Acute bilateral low back pain without sciatica   2. Urinary tract infection without hematuria, site unspecified     Tests performed today include: Vital signs - see below for your results today  Medications prescribed:   Take any prescribed medications only as directed.  Home care instructions:  Follow any educational materials contained in this packet Please rest, use ice or heat on your back for the next several days Do not lift, push, pull anything more than 10 pounds for the next week  Follow-up instructions: Please follow-up with your primary care provider in the next 1 week for further evaluation of your symptoms.   Return instructions:  SEEK IMMEDIATE MEDICAL ATTENTION IF YOU HAVE: New numbness, tingling, weakness, or problem with the use of your arms or legs Severe back pain not relieved with medications Loss control of your bowels or bladder Increasing pain in any areas of the body (such as chest or abdominal pain) Shortness of breath, dizziness, or fainting.  Worsening nausea (feeling sick to your stomach), vomiting, fever, or sweats Any other emergent concerns regarding your health   Additional Information:  Your vital signs today were: BP 133/78 (BP Location: Right Arm)    Pulse 90    Temp 98 F (36.7 C) (Oral)    Resp 18    Ht 5\' 6"  (1.676 m)    Wt 90.7 kg (200 lb)    LMP 10/30/2016    SpO2 95%    BMI 32.28 kg/m  If your blood pressure (BP) was elevated above 135/85 this visit, please have this repeated by your doctor within one month. --------------

## 2016-12-10 NOTE — ED Notes (Signed)
Pt concerned about wait time.

## 2016-12-10 NOTE — ED Provider Notes (Signed)
[facility] DEPT [facility] Provider Note   CSN: [id number] Arrival date & time: 12/10/16  1042     History   Chief Complaint Chief Complaint  Patient presents with  . Back Pain    HPI Kylie [name] is a 46 y.o. female.  HPI  46 y.o. female with a hx of Bipolar Disorder, DDD, presents to the Emergency Department today due to x 2 months of lower back pain. Pt denies any acute trauma or injury to area. Pt states that she did not see her PCP for this as she knew that they did not do xray imaging there. Notes pain is worse with ambulation and relieved with rest. Denies pain currently. Notes pain in bilateral lower lumbar musculature. No midline pain. No numbness/tingling. No loss of bowel or bladder function. Attempted Flexeril and Methadone with minimal relief. No fevers. No CP/SOB. No abdominal pain. Notes odorous urine. No polyuria. No dysuria. No other symptoms noted.   Past Medical History:  Diagnosis Date  . Anxiety   . Bipolar 1 disorder ([place])   . Cervical cancer ([place])    Stage 2  . Chronic UTI   . Degenerative disk disease   . Hepatitis B infection   . Ovarian cyst   . Panic attack     Patient Active Problem List   Diagnosis Date Noted  . Hypertriglyceridemia 10/14/2016  . Hyperglycemia 10/14/2016  . H/O abnormal mammogram 09/29/2016  . Constipation 01/23/2016  . Generalized anxiety disorder   . Empyema lung ([place])   . Mediastinal lymphadenopathy   . Other specified hypothyroidism   . Chronic pain syndrome   . Urinary, incontinence, stress female 10/02/2015  . 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 Suicide attempt ([place])   . Hip pain, left 08/14/2011  . PAP SMER W/ATYPCL SQAUMOUS CELL NOT EXCLD HI GRD 04/09/2010  . HEPATITIS B 01/31/2008  . Drug abuse in remission 04/04/2007  . OBESITY, NOS 07/29/2006  . TOBACCO DEPENDENCE 07/29/2006  . Bipolar I disorder, most recent episode mixed ([place]) 07/29/2006  . Chronic  bilateral low back pain without sciatica 07/29/2006    Past Surgical History:  Procedure Laterality Date  . CERVIX SURGERY    . MANDIBLE FRACTURE SURGERY    . TUBAL LIGATION    . VIDEO ASSISTED THORACOSCOPY (VATS)/EMPYEMA Right 11/26/2015   Procedure: VIDEO ASSISTED THORACOSCOPY (VATS)/EMPYEMA;  Surgeon: Ivin Poot, MD;  Location: [facility];  Service: Thoracic;  Laterality: Right;  Marland Kitchen VIDEO BRONCHOSCOPY N/A 11/26/2015   Procedure: VIDEO BRONCHOSCOPY;  Surgeon: Ivin Poot, MD;  Location: [facility] OR;  Service: Thoracic;  Laterality: N/A;  . WISDOM TOOTH EXTRACTION      OB History    No data available       Home Medications    Prior to Admission medications   Medication Sig Start Date End Date Taking? Authorizing Provider  cyclobenzaprine (FLEXERIL) 10 MG tablet Take 1 tablet (10 mg total) by mouth 3 (three) times daily. 01/23/16   Zenia Resides, MD  gabapentin (NEURONTIN) 300 MG capsule TAKE 1 CAPSULE BY MOUTH 3 TIMES DAILY 11/23/16   Zenia Resides, MD  lactulose (CEPHULAC) 10 g packet Take 1 packet (10 g total) by mouth 3 (three) times daily. 10/15/16   Zenia Resides, MD  levothyroxine (SYNTHROID, LEVOTHROID) 50 MCG tablet TAKE ONE TABLET BY MOUTH EVERY DAY 11/23/16   Zenia Resides, MD  lithium carbonate 300 MG capsule Take 2 capsules (600 mg total)  by mouth 2 (two) times daily. Note change in dose 10/15/16   Zenia Resides, MD  methadone (DOLOPHINE) 10 MG tablet Take 110 mg by mouth daily.     [provider]  nicotine (NICODERM CQ - DOSED IN MG/24 HOURS) 21 mg/24hr patch Place 1 patch (21 mg total) onto the skin daily. 10/14/16   Zenia Resides, MD  polyethylene glycol (MIRALAX / GLYCOLAX) packet Take 17 g by mouth 2 (two) times daily. Patient not taking: Reported on 10/14/2016 01/23/16   Zenia Resides, MD  QUEtiapine (SEROQUEL XR) 300 MG 24 hr tablet Take 1 tablet (300 mg total) by mouth at bedtime. 10/15/16   Zenia Resides, MD    Family  History History reviewed. No pertinent family history.  Social History Social History  Substance Use Topics  . Smoking status: Former Smoker    Packs/day: 1.00    Years: 25.00    Types: Cigarettes  . Smokeless tobacco: Never Used  . Alcohol use No     Allergies   Pregabalin; Hydrocodone; Citalopram; Ibuprofen; Naproxen; and Povidone-iodine   Review of Systems Review of Systems  Constitutional: Negative for fever.  Respiratory: Negative for shortness of breath.   Cardiovascular: Negative for chest pain.  Gastrointestinal: Negative for nausea and vomiting.  Genitourinary: Negative for difficulty urinating and dysuria.  Musculoskeletal: Positive for back pain.  Skin: Negative for rash.  Allergic/Immunologic: Negative for immunocompromised state.   Physical Exam Updated Vital Signs BP 133/78 (BP Location: Right Arm)   Pulse 90   Temp 98 F (36.7 C) (Oral)   Resp 18   Ht 5\' 6"  (1.676 m)   Wt 90.7 kg (200 lb)   LMP 10/30/2016   SpO2 95%   BMI 32.28 kg/m   Physical Exam  Constitutional: She is oriented to person, place, and time. Vital signs are normal. She appears well-developed and well-nourished.  HENT:  Head: Normocephalic and atraumatic.  Right Ear: Hearing normal.  Left Ear: Hearing normal.  Eyes: Pupils are equal, round, and reactive to light. Conjunctivae and EOM are normal.  Neck: Normal range of motion. Neck supple.  Cardiovascular: Normal rate, regular rhythm, normal heart sounds and intact distal pulses.   Pulmonary/Chest: Effort normal and breath sounds normal.  Abdominal: Soft. Bowel sounds are normal. There is no tenderness. There is no rigidity, no rebound, no guarding, no CVA tenderness, no tenderness at McBurney's point and negative Murphy's sign.  Musculoskeletal: Normal range of motion.  TTP bilateral lower lumbar musculature. No midline spinous process tenderness. No palpable or visible deformities.   Neurological: She is alert and oriented to  person, place, and time.  Skin: Skin is warm and dry.  Psychiatric: She has a normal mood and affect. Her speech is normal and behavior is normal. Thought content normal.  Nursing note and vitals reviewed.  ED Treatments / Results  Labs (all labs ordered are listed, but only abnormal results are displayed) Labs Reviewed  URINALYSIS, ROUTINE W REFLEX MICROSCOPIC - Abnormal; Notable for the following:       Result Value   APPearance CLOUDY (*)    Hgb urine dipstick SMALL (*)    Bilirubin Urine SMALL (*)    Ketones, ur 15 (*)    Nitrite POSITIVE (*)    Leukocytes, UA MODERATE (*)    All other components within normal limits  URINALYSIS, MICROSCOPIC (REFLEX) - Abnormal; Notable for the following:    Bacteria, UA MANY (*)    Squamous Epithelial / LPF  6-30 (*)    All other components within normal limits  PREGNANCY, URINE    EKG  EKG Interpretation None       Radiology No results found.  Procedures Procedures (including critical care time)  Medications Ordered in ED Medications - No data to display   Initial Impression / Assessment and Plan / ED Course  I have reviewed the triage vital signs and the nursing notes.  Pertinent labs & imaging results that were available during my care of the patient were reviewed by me and considered in my medical decision making (see chart for details).  Final Clinical Impressions(s) / ED Diagnoses     {I have reviewed the relevant previous healthcare records.  {I obtained HPI from historian.   ED Course:  Assessment: Patient is a 46 y.o. female with a hx of DDD who presents to the ED with back pain x 2 months. Bilateral. No midline. No neurological deficits appreciated. Patient is ambulatory. No warning symptoms of back pain including: fecal incontinence, urinary retention or overflow incontinence, night sweats, waking from sleep with back pain, unexplained fevers or weight loss, h/o cancer, IVDU, recent trauma. No concern for cauda  equina, epidural abscess, or other serious cause of back pain. Pt noted odorous urine. No dysuria. UA shows UTI. Positive Nitrites. Leuks. Culture sent. Likely lumbar strain. Doubt UTI causing symptoms. No CVA. Will treat accordingly. Conservative measures such as rest, ice/heat and pain medicine indicated with PCP follow-up if no improvement with conservative management. Given Rx Motrin. Listed as allergy, but patient states that she has taken in the past and takes at home. At time of discharge, Patient is in no acute distress. Vital Signs are stable. Patient is able to ambulate. Patient able to tolerate PO.   Disposition/Plan:  DC Home Additional Verbal discharge instructions given and discussed with patient.  Pt Instructed to f/u with PCP in the next week for evaluation and treatment of symptoms. Return precautions given Pt acknowledges and agrees with plan  Supervising Physician Alfonzo Beers, MD  Final diagnoses:  Acute bilateral low back pain without sciatica  Urinary tract infection without hematuria, site unspecified    New Prescriptions New Prescriptions   No medications on file     Shary Decamp, Hershal Coria 12/10/16 Holley, Martha, MD 12/10/16 1213

## 2016-12-12 LAB — URINE CULTURE

## 2016-12-13 ENCOUNTER — Telehealth: Payer: Self-pay

## 2016-12-13 NOTE — Telephone Encounter (Signed)
Post ED Visit - Positive Culture Follow-up  Culture report reviewed by antimicrobial stewardship pharmacist:  []  Elenor Quinones, Pharm.D. []  Heide Guile, Pharm.D., BCPS AQ-ID []  Parks Neptune, Pharm.D., BCPS []  Alycia Rossetti, Pharm.D., BCPS []  [place], Pharm.D., BCPS, AAHIVP []  Legrand Como, Pharm.D., BCPS, AAHIVP []  Salome Arnt, PharmD, BCPS []  Dimitri Ped, PharmD, BCPS []  Vincenza Hews, PharmD, BCPS Jimmy Footman Pharm D Positive urine culture Treated with Cephalexin, organism sensitive to the same and no further patient follow-up is required at this time.  Genia Del 12/13/2016, 10:48 AM

## 2016-12-14 ENCOUNTER — Telehealth: Payer: Self-pay | Admitting: Family Medicine

## 2016-12-14 NOTE — Telephone Encounter (Signed)
Pt went to ED on Thursday for UTI. She was given an antibotic but it isnt working. She is miserable.  She wants to talk to dr Andria Frames

## 2016-12-14 NOTE — Telephone Encounter (Signed)
Called.  C/O severe back pain.  No urgency, fever or flank pain.  Does have some frequency.  I suspect she has a flair of her chronic back pain causing her symptoms.  Offered appointment.  States she will see me this week.

## 2016-12-14 NOTE — Telephone Encounter (Signed)
Pt is calling to see when the doctor would be calling her. The medication she was prescribed in the ED is not working. jw

## 2016-12-17 ENCOUNTER — Ambulatory Visit: Payer: Medicaid Other | Admitting: Family Medicine

## 2016-12-18 ENCOUNTER — Other Ambulatory Visit: Payer: Self-pay | Admitting: Family Medicine

## 2017-01-15 ENCOUNTER — Emergency Department (HOSPITAL_COMMUNITY): Payer: Medicaid Other

## 2017-01-15 ENCOUNTER — Encounter (HOSPITAL_COMMUNITY): Payer: Self-pay | Admitting: Emergency Medicine

## 2017-01-15 ENCOUNTER — Inpatient Hospital Stay (HOSPITAL_COMMUNITY)
Admission: EM | Admit: 2017-01-15 | Discharge: 2017-01-20 | DRG: 917 | Disposition: A | Discharge status: Home / Self Care | Payer: Medicaid Other | Attending: Family Medicine | Admitting: Family Medicine

## 2017-01-15 DIAGNOSIS — R402312 Coma scale, best motor response, none, at arrival to emergency department: Secondary | ICD-10-CM | POA: Diagnosis present

## 2017-01-15 DIAGNOSIS — M545 Low back pain, unspecified: Secondary | ICD-10-CM | POA: Diagnosis present

## 2017-01-15 DIAGNOSIS — E669 Obesity, unspecified: Secondary | ICD-10-CM | POA: Diagnosis present

## 2017-01-15 DIAGNOSIS — J9602 Acute respiratory failure with hypercapnia: Secondary | ICD-10-CM

## 2017-01-15 DIAGNOSIS — J9601 Acute respiratory failure with hypoxia: Secondary | ICD-10-CM | POA: Diagnosis present

## 2017-01-15 DIAGNOSIS — A419 Sepsis, unspecified organism: Secondary | ICD-10-CM

## 2017-01-15 DIAGNOSIS — F3160 Bipolar disorder, current episode mixed, unspecified: Secondary | ICD-10-CM | POA: Diagnosis present

## 2017-01-15 DIAGNOSIS — J690 Pneumonitis due to inhalation of food and vomit: Secondary | ICD-10-CM | POA: Diagnosis present

## 2017-01-15 DIAGNOSIS — J189 Pneumonia, unspecified organism: Secondary | ICD-10-CM

## 2017-01-15 DIAGNOSIS — E119 Type 2 diabetes mellitus without complications: Secondary | ICD-10-CM | POA: Diagnosis present

## 2017-01-15 DIAGNOSIS — T829XXA Unspecified complication of cardiac and vascular prosthetic device, implant and graft, initial encounter: Secondary | ICD-10-CM

## 2017-01-15 DIAGNOSIS — E039 Hypothyroidism, unspecified: Secondary | ICD-10-CM | POA: Diagnosis present

## 2017-01-15 DIAGNOSIS — G894 Chronic pain syndrome: Secondary | ICD-10-CM | POA: Diagnosis present

## 2017-01-15 DIAGNOSIS — F410 Panic disorder [episodic paroxysmal anxiety] without agoraphobia: Secondary | ICD-10-CM | POA: Diagnosis present

## 2017-01-15 DIAGNOSIS — Z8541 Personal history of malignant neoplasm of cervix uteri: Secondary | ICD-10-CM

## 2017-01-15 DIAGNOSIS — T40601A Poisoning by unspecified narcotics, accidental (unintentional), initial encounter: Principal | ICD-10-CM | POA: Diagnosis present

## 2017-01-15 DIAGNOSIS — R402212 Coma scale, best verbal response, none, at arrival to emergency department: Secondary | ICD-10-CM | POA: Diagnosis present

## 2017-01-15 DIAGNOSIS — D649 Anemia, unspecified: Secondary | ICD-10-CM | POA: Diagnosis present

## 2017-01-15 DIAGNOSIS — E870 Hyperosmolality and hypernatremia: Secondary | ICD-10-CM | POA: Diagnosis present

## 2017-01-15 DIAGNOSIS — E872 Acidosis: Secondary | ICD-10-CM

## 2017-01-15 DIAGNOSIS — R6521 Severe sepsis with septic shock: Secondary | ICD-10-CM | POA: Diagnosis present

## 2017-01-15 DIAGNOSIS — G8929 Other chronic pain: Secondary | ICD-10-CM | POA: Diagnosis present

## 2017-01-15 DIAGNOSIS — F1410 Cocaine abuse, uncomplicated: Secondary | ICD-10-CM | POA: Diagnosis present

## 2017-01-15 DIAGNOSIS — Z915 Personal history of self-harm: Secondary | ICD-10-CM

## 2017-01-15 DIAGNOSIS — Z79891 Long term (current) use of opiate analgesic: Secondary | ICD-10-CM

## 2017-01-15 DIAGNOSIS — Z9114 Patient's other noncompliance with medication regimen: Secondary | ICD-10-CM

## 2017-01-15 DIAGNOSIS — Z87891 Personal history of nicotine dependence: Secondary | ICD-10-CM

## 2017-01-15 DIAGNOSIS — G92 Toxic encephalopathy: Secondary | ICD-10-CM | POA: Diagnosis present

## 2017-01-15 DIAGNOSIS — R402112 Coma scale, eyes open, never, at arrival to emergency department: Secondary | ICD-10-CM | POA: Diagnosis present

## 2017-01-15 DIAGNOSIS — F1310 Sedative, hypnotic or anxiolytic abuse, uncomplicated: Secondary | ICD-10-CM | POA: Diagnosis present

## 2017-01-15 DIAGNOSIS — E878 Other disorders of electrolyte and fluid balance, not elsewhere classified: Secondary | ICD-10-CM | POA: Diagnosis present

## 2017-01-15 DIAGNOSIS — Z6841 Body Mass Index (BMI) 40.0 and over, adult: Secondary | ICD-10-CM

## 2017-01-15 DIAGNOSIS — Z9151 Personal history of suicidal behavior: Secondary | ICD-10-CM

## 2017-01-15 DIAGNOSIS — E7220 Disorder of urea cycle metabolism, unspecified: Secondary | ICD-10-CM | POA: Diagnosis present

## 2017-01-15 DIAGNOSIS — T829XXS Unspecified complication of cardiac and vascular prosthetic device, implant and graft, sequela: Secondary | ICD-10-CM

## 2017-01-15 DIAGNOSIS — R0600 Dyspnea, unspecified: Secondary | ICD-10-CM

## 2017-01-15 HISTORY — DX: Type 2 diabetes mellitus without complications: E11.9

## 2017-01-15 LAB — I-STAT ARTERIAL BLOOD GAS, ED
ACID-BASE DEFICIT: 4 mmol/L — AB (ref 0.0–2.0)
Bicarbonate: 25.2 mmol/L (ref 20.0–28.0)
O2 Saturation: 97 %
PH ART: 7.206 — AB (ref 7.350–7.450)
PO2 ART: 108 mmHg (ref 83.0–108.0)
TCO2: 27 mmol/L (ref 0–100)
pCO2 arterial: 63.6 mmHg — ABNORMAL HIGH (ref 32.0–48.0)

## 2017-01-15 LAB — I-STAT CHEM 8, ED
BUN: 16 mg/dL (ref 6–20)
CALCIUM ION: 1.01 mmol/L — AB (ref 1.15–1.40)
CHLORIDE: 102 mmol/L (ref 101–111)
Creatinine, Ser: 0.9 mg/dL (ref 0.44–1.00)
GLUCOSE: 277 mg/dL — AB (ref 65–99)
HCT: 41 % (ref 36.0–46.0)
Hemoglobin: 13.9 g/dL (ref 12.0–15.0)
Potassium: 4.3 mmol/L (ref 3.5–5.1)
SODIUM: 140 mmol/L (ref 135–145)
TCO2: 26 mmol/L (ref 0–100)

## 2017-01-15 LAB — I-STAT BETA HCG BLOOD, ED (MC, WL, AP ONLY): I-stat hCG, quantitative: <5 m[IU]/mL (ref <5)

## 2017-01-15 LAB — CBG MONITORING, ED: GLUCOSE-CAPILLARY: 181 mg/dL — AB (ref 65–99)

## 2017-01-15 LAB — I-STAT TROPONIN, ED: TROPONIN I, POC: 0 ng/mL (ref 0.00–0.08)

## 2017-01-15 LAB — I-STAT CG4 LACTIC ACID, ED: LACTIC ACID, VENOUS: 1.79 mmol/L (ref 0.5–1.9)

## 2017-01-15 MED ORDER — ETOMIDATE 2 MG/ML IV SOLN
INTRAVENOUS | Status: AC | PRN
  Administered 2017-01-15: 20 mg via INTRAVENOUS

## 2017-01-15 MED ORDER — NOREPINEPHRINE BITARTRATE 1 MG/ML IV SOLN
0.0000 ug/min | Freq: Once | INTRAVENOUS | Status: AC
  Administered 2017-01-15: 2 ug/min via INTRAVENOUS
  Filled 2017-01-15: qty 4

## 2017-01-15 MED ORDER — SODIUM CHLORIDE 0.9 % IV BOLUS (SEPSIS)
1000.0000 mL | Freq: Once | INTRAVENOUS | Status: AC
  Administered 2017-01-15: 1000 mL via INTRAVENOUS

## 2017-01-15 MED ORDER — PIPERACILLIN-TAZOBACTAM 3.375 G IVPB 30 MIN
3.3750 g | Freq: Once | INTRAVENOUS | Status: AC
  Administered 2017-01-16: 3.375 g via INTRAVENOUS
  Filled 2017-01-15: qty 50

## 2017-01-15 MED ORDER — FENTANYL CITRATE (PF) 100 MCG/2ML IJ SOLN
100.0000 ug | INTRAMUSCULAR | Status: DC | PRN
  Administered 2017-01-16 – 2017-01-17 (×2): 100 ug via INTRAVENOUS
  Filled 2017-01-15: qty 2

## 2017-01-15 MED ORDER — VANCOMYCIN HCL 10 G IV SOLR
1500.0000 mg | Freq: Once | INTRAVENOUS | Status: AC
  Administered 2017-01-16: 1500 mg via INTRAVENOUS
  Filled 2017-01-15: qty 1500

## 2017-01-15 MED ORDER — ROCURONIUM BROMIDE 50 MG/5ML IV SOLN
INTRAVENOUS | Status: AC | PRN
  Administered 2017-01-15: 100 mg via INTRAVENOUS

## 2017-01-15 MED ORDER — MIDAZOLAM HCL 2 MG/2ML IJ SOLN
2.0000 mg | INTRAMUSCULAR | Status: DC | PRN
  Administered 2017-01-17 (×2): 2 mg via INTRAVENOUS
  Filled 2017-01-15 (×2): qty 2

## 2017-01-15 MED ORDER — FENTANYL CITRATE (PF) 100 MCG/2ML IJ SOLN
100.0000 ug | INTRAMUSCULAR | Status: DC | PRN
  Filled 2017-01-15: qty 2

## 2017-01-15 MED ORDER — MIDAZOLAM HCL 2 MG/2ML IJ SOLN
2.0000 mg | INTRAMUSCULAR | Status: AC | PRN
  Administered 2017-01-16 (×3): 2 mg via INTRAVENOUS
  Filled 2017-01-15 (×3): qty 2

## 2017-01-15 NOTE — ED Notes (Signed)
Blood collected earlier and sent to main lab.

## 2017-01-15 NOTE — Progress Notes (Signed)
   01/15/17 2319  Clinical Encounter Type  Visited With Health care provider;Patient not available  Visit Type Initial;Spiritual support;Code;ED;Trauma   Chaplain told about CPR patient while in the ED on another case.  Patient intubated, no family available at this time.  Chaplain will remain available as needed.

## 2017-01-15 NOTE — ED Provider Notes (Signed)
[facility] DEPT Provider Note   CSN: [id number] Arrival date & time: 01/15/17  2255     History   Chief Complaint Chief Complaint  Patient presents with  . Drug Overdose    HPI Kylie [name] is a 46 y.o. female.  46 yo F ith a chief complaint of unresponsiveness. Per EMS the patient started feeling bad as she was driving. Had been doing illegal drugs throughout the day. Upon EMS arrival the patient was unresponsive. GCS of 3. Unable to intubate in the field. Level V caveat altered mental status   The history is provided by the EMS personnel.  Illness  This is a new problem. The current episode started less than 1 hour ago. The problem occurs constantly. The problem has not changed since onset.Pertinent negatives include no chest pain, no headaches and no shortness of breath. Nothing aggravates the symptoms. Nothing relieves the symptoms. She has tried nothing for the symptoms. The treatment provided no relief.    Past Medical History:  Diagnosis Date  . Anxiety   . Bipolar 1 disorder ([place])   . Cervical cancer ([place])    Stage 2  . Chronic UTI   . Degenerative disk disease   . Diabetes mellitus without complication ([place])   . Hepatitis B infection   . Ovarian cyst   . Panic attack     Patient Active Problem List   Diagnosis Date Noted  . Acute respiratory failure with hypoxia ([place]) 01/16/2017  . Hypertriglyceridemia 10/14/2016  . Hyperglycemia 10/14/2016  . H/O abnormal mammogram 09/29/2016  . Constipation 01/23/2016  . Generalized anxiety disorder   . Empyema lung ([place])   . Mediastinal lymphadenopathy   . Other specified hypothyroidism   . Chronic pain syndrome   . Urinary, incontinence, stress female 10/02/2015  . 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 Suicide attempt ([place])   . Hip pain, left 08/14/2011  . PAP SMER W/ATYPCL SQAUMOUS CELL NOT EXCLD HI GRD 04/09/2010  . HEPATITIS B 01/31/2008  . Drug abuse  in remission 04/04/2007  . OBESITY, NOS 07/29/2006  . TOBACCO DEPENDENCE 07/29/2006  . Bipolar I disorder, most recent episode mixed ([place]) 07/29/2006  . Chronic bilateral low back pain without sciatica 07/29/2006    Past Surgical History:  Procedure Laterality Date  . CERVIX SURGERY    . MANDIBLE FRACTURE SURGERY    . TUBAL LIGATION    . VIDEO ASSISTED THORACOSCOPY (VATS)/EMPYEMA Right 11/26/2015   Procedure: VIDEO ASSISTED THORACOSCOPY (VATS)/EMPYEMA;  Surgeon: Ivin Poot, MD;  Location: [facility];  Service: Thoracic;  Laterality: Right;  Marland Kitchen VIDEO BRONCHOSCOPY N/A 11/26/2015   Procedure: VIDEO BRONCHOSCOPY;  Surgeon: Ivin Poot, MD;  Location: [facility] OR;  Service: Thoracic;  Laterality: N/A;  . WISDOM TOOTH EXTRACTION      OB History    No data available       Home Medications    Prior to Admission medications   Medication Sig Start Date End Date Taking? Authorizing Provider  cyclobenzaprine (FLEXERIL) 10 MG tablet Take 1 tablet (10 mg total) by mouth 3 (three) times daily. 01/23/16   Zenia Resides, MD  gabapentin (NEURONTIN) 300 MG capsule TAKE 1 CAPSULE BY MOUTH 3 TIMES DAILY 11/23/16   Zenia Resides, MD  ibuprofen (ADVIL,MOTRIN) 600 MG tablet Take 1 tablet (600 mg total) by mouth every 6 (six) hours as needed. 12/10/16   Shary Decamp, PA-C  lactulose (CEPHULAC) 10 g packet Take 1 packet (10  g total) by mouth 3 (three) times daily. 10/15/16   Zenia Resides, MD  levothyroxine (SYNTHROID, LEVOTHROID) 50 MCG tablet TAKE ONE TABLET BY MOUTH EVERY DAY 11/23/16   Zenia Resides, MD  lithium carbonate 300 MG capsule Take 2 capsules (600 mg total) by mouth 2 (two) times daily. Note change in dose 10/15/16   Zenia Resides, MD  methadone (DOLOPHINE) 10 MG tablet Take 110 mg by mouth daily.     [provider]  nicotine (NICODERM CQ - DOSED IN MG/24 HOURS) 21 mg/24hr patch Place 1 patch (21 mg total) onto the skin daily. 10/14/16   Zenia Resides, MD  polyethylene  glycol (MIRALAX / GLYCOLAX) packet Take 17 g by mouth 2 (two) times daily. Patient not taking: Reported on 10/14/2016 01/23/16   Zenia Resides, MD  QUEtiapine (SEROQUEL XR) 300 MG 24 hr tablet Take 1 tablet (300 mg total) by mouth at bedtime. 10/15/16   Zenia Resides, MD    Family History No family history on file.  Social History Social History  Substance Use Topics  . Smoking status: Former Smoker    Packs/day: 1.00    Years: 25.00    Types: Cigarettes  . Smokeless tobacco: Never Used  . Alcohol use No     Allergies   Pregabalin; Hydrocodone; Citalopram; Ibuprofen; Naproxen; and Povidone-iodine   Review of Systems Review of Systems  Unable to perform ROS: Patient unresponsive  Constitutional: Negative for chills and fever.  HENT: Negative for congestion and rhinorrhea.   Eyes: Negative for redness and visual disturbance.  Respiratory: Negative for shortness of breath and wheezing.   Cardiovascular: Negative for chest pain and palpitations.  Gastrointestinal: Negative for nausea and vomiting.  Genitourinary: Negative for dysuria and urgency.  Musculoskeletal: Negative for arthralgias and myalgias.  Skin: Negative for pallor and wound.  Neurological: Negative for dizziness and headaches.     Physical Exam Updated Vital Signs BP 105/69   Pulse 66   Temp (!) 97.3 F (36.3 C)   Resp 20   Ht 5' 4" (1.626 m)   Wt 108.9 kg (240 lb 1.3 oz)   SpO2 100%   BMI 41.21 kg/m   Physical Exam  Constitutional: She appears well-developed and well-nourished. No distress.  HENT:  Head: Normocephalic and atraumatic.  Eyes: Pupils are equal, round, and reactive to light. EOM are normal.  Neck: Normal range of motion. Neck supple.  Cardiovascular: Normal rate and regular rhythm.  Exam reveals no gallop and no friction rub.   No murmur heard. Pulmonary/Chest: Effort normal. She has no wheezes. She has no rales.  Abdominal: Soft. She exhibits no distension. There is no  tenderness.  Musculoskeletal: She exhibits no edema or tenderness.  Neurological: She is unresponsive. GCS eye subscore is 1. GCS verbal subscore is 1. GCS motor subscore is 1.  Skin: Skin is warm and dry. She is not diaphoretic.  Psychiatric: She has a normal mood and affect. Her behavior is normal.  Nursing note and vitals reviewed.    ED Treatments / Results  Labs (all labs ordered are listed, but only abnormal results are displayed) Labs Reviewed  AMMONIA - Abnormal; Notable for the following:       Result Value   Ammonia 62 (*)    All other components within normal limits  COMPREHENSIVE METABOLIC PANEL - Abnormal; Notable for the following:    Glucose, Bld 258 (*)    Creatinine, Ser 1.03 (*)    Calcium  7.6 (*)    Total Protein 6.1 (*)    Albumin 3.3 (*)    All other components within normal limits  CBC WITH DIFFERENTIAL/PLATELET - Abnormal; Notable for the following:    WBC 16.8 (*)    Hemoglobin 11.6 (*)    RDW 15.9 (*)    Neutro Abs 15.0 (*)    All other components within normal limits  URINALYSIS, ROUTINE W REFLEX MICROSCOPIC - Abnormal; Notable for the following:    APPearance HAZY (*)    Glucose, UA 150 (*)    Protein, ur 30 (*)    Bacteria, UA FEW (*)    Squamous Epithelial / LPF 0-5 (*)    All other components within normal limits  ACETAMINOPHEN LEVEL - Abnormal; Notable for the following:    Acetaminophen (Tylenol), Serum <10 (*)    All other components within normal limits  CBC - Abnormal; Notable for the following:    Hemoglobin 11.0 (*)    HCT 35.2 (*)    RDW 15.9 (*)    All other components within normal limits  BASIC METABOLIC PANEL - Abnormal; Notable for the following:    Calcium 7.4 (*)    Anion gap 3 (*)    All other components within normal limits  CBG MONITORING, ED - Abnormal; Notable for the following:    Glucose-Capillary 181 (*)    All other components within normal limits  I-STAT CHEM 8, ED - Abnormal; Notable for the following:     Glucose, Bld 277 (*)    Calcium, Ion 1.01 (*)    All other components within normal limits  I-STAT ARTERIAL BLOOD GAS, ED - Abnormal; Notable for the following:    pH, Arterial 7.206 (*)    pCO2 arterial 63.6 (*)    Acid-base deficit 4.0 (*)    All other components within normal limits  URINE CULTURE  CULTURE, BLOOD (ROUTINE X 2)  CULTURE, BLOOD (ROUTINE X 2)  CULTURE, RESPIRATORY (NON-EXPECTORATED)  MRSA PCR SCREENING  ETHANOL  SALICYLATE LEVEL  MAGNESIUM  PHOSPHORUS  GLUCOSE, CAPILLARY  GLUCOSE, CAPILLARY  RAPID URINE DRUG SCREEN, HOSP PERFORMED  HIV ANTIBODY (ROUTINE TESTING)  I-STAT CG4 LACTIC ACID, ED  I-STAT BETA HCG BLOOD, ED ([facility], [facility], [facility] ONLY)  I-STAT BETA HCG BLOOD, ED ([facility], [facility], [facility] ONLY)  I-STAT TROPONIN, ED    EKG  EKG Interpretation None       Radiology Ct Head Wo Contrast  Result Date: 01/16/2017 CLINICAL DATA:  Altered level of consciousness. EXAM: CT HEAD WITHOUT CONTRAST TECHNIQUE: Contiguous axial images were obtained from the base of the skull through the vertex without intravenous contrast. COMPARISON:  Head CT 07/01/2010 FINDINGS: Brain: No intracranial hemorrhage, mass effect, or midline shift. No hydrocephalus. The basilar cisterns are patent. No evidence of acute ischemia. No extra-axial or intracranial fluid collection. Vascular: No hyperdense vessel or unexpected calcification. Skull: No skull fracture or focal lesion. Sinuses/Orbits: Mucosal thickening in the ethmoid air cells. Mastoid air cells are clear. Visualized orbits are normal. Other: None. IMPRESSION: No acute intracranial abnormality. Electronically Signed   By: Jeb Levering M.D.   On: 01/16/2017 00:37   Ct Angio Chest Pe W And/or Wo Contrast  Result Date: 01/16/2017 CLINICAL DATA:  Unresponsive EXAM: CT ANGIOGRAPHY CHEST WITH CONTRAST TECHNIQUE: Multidetector CT imaging of the chest was performed using the standard protocol during bolus administration of intravenous contrast. Multiplanar  CT image reconstructions and MIPs were obtained to evaluate the vascular anatomy. CONTRAST:  80 cc Isovue  370 intravenous COMPARISON:  Radiograph 01/15/2017, CT chest 11/23/2015 FINDINGS: Cardiovascular: For distal branch opacification. No acute central filling defect is visualized. Non aneurysmal aorta. No dissection is seen. Borderline heart size. No large pericardial effusion. Mediastinum/Nodes: Endotracheal tube about 2.7 cm superior to the carina. Esophageal tube tip distal stomach. No thyroid mass. Few prominent mediastinal lymph nodes, a lymph node adjacent to the large measures 11 mm. Right hilar nodes up to 11 mm. Lungs/Pleura: Negative for pneumothorax. Multifocal consolidations and ground-glass densities within the upper and lower lobes. Trace pleural effusions. Upper Abdomen: No acute abnormality in the upper abdomen. Musculoskeletal: No chest wall abnormality. No acute or significant osseous findings. Degenerative changes. Review of the MIP images confirms the above findings. IMPRESSION: 1. Limited evaluation for distal segmental and subsegmental emboli due to extensive consolidations and suboptimal opacification of distal branch vessels. No acute central PE is seen. 2. Multifocal consolidations and ground-glass densities within the upper and lower lobes, concerning for multifocal pneumonia, cannot exclude aspiration at the lower lobes. 3. Trace pleural effusions. Electronically Signed   By: Donavan Foil M.D.   On: 01/16/2017 00:55   Dg Chest Port 1 View  Result Date: 01/16/2017 CLINICAL DATA:  Central line EXAM: PORTABLE CHEST 1 VIEW COMPARISON:  01/15/2017 FINDINGS: Endotracheal tube tip is approximately 3 cm superior to carina. Esophageal tube to is below the diaphragm. Placement of right-sided central venous catheter with tip overlying the low right atrium. No pneumothorax is seen. Low lung volumes with bilateral pulmonary infiltrates. Cardiomegaly. IMPRESSION: 1. Right-sided central venous  catheter tip overlies the low right atrium. Negative for pneumothorax 2. Low lung volumes with continued moderate bilateral pulmonary infiltrates 3. Cardiomegaly Electronically Signed   By: Donavan Foil M.D.   On: 01/16/2017 02:26   Dg Chest Portable 1 View  Result Date: 01/15/2017 CLINICAL DATA:  Unresponsive, intubated EXAM: PORTABLE CHEST 1 VIEW COMPARISON:  12/05/2015 FINDINGS: Endotracheal tube tip is about 2.6 cm superior to the carina. Low lung volumes. Esophageal tube tip is below the diaphragm but is not included. Linear scarring at the right base. Increased opacity in the left upper lobe and left lung base. Mild cardiomegaly. No pneumothorax. IMPRESSION: 1. Endotracheal tube tip about 2.6 cm superior to the carina 2. Left upper lobe and left lung base airspace opacities concerning for pneumonia 3. Linear scarring at the right base 4. Cardiomegaly Electronically Signed   By: Donavan Foil M.D.   On: 01/15/2017 23:40    Procedures  Procedure note: Ultrasound Guided Peripheral IV Ultrasound guided peripheral 1.88 inch angiocath IV placement performed by me. Indications: Nursing unable to place IV. Details: The antecubital fossa and upper arm were evaluated with a multifrequency linear probe. Patent brachial veins were noted. 1 attempt was made to cannulate a vein under realtime US guidance with successful cannulation of the vein and catheter placement. There is return of non-pulsatile dark red blood. The patient tolerated the procedure well without complications. Images archived electronically.  CPT codes: [phone] and [phone] EJ placement: 18 gauge IV placed in L EJ. Skin prepped with alcohol pads, L EJ identified with Valsalva. Cannulated with good return of dark, non-pulsatile blood. Tachyderm placed after easily flushed with NS.  Procedure Name: Intubation Date/Time: 01/15/2017 11:17 PM Performed by: Deno Etienne Pre-anesthesia Checklist: Emergency Drugs available, Patient identified, Suction  available, Patient being monitored and Timeout performed Oxygen Delivery Method: Ambu bag Preoxygenation: Pre-oxygenation with 100% oxygen Induction Type: Rapid sequence Ventilation: Mask ventilation without difficulty Laryngoscope Size: Glidescope Grade View:  Grade I Tube size: 7.5 mm Number of attempts: 1 Placement Confirmation: ETT inserted through vocal cords under direct vision Secured at: 22 cm Tube secured with: ETT holder Dental Injury: Teeth and Oropharynx as per pre-operative assessment  Difficulty Due To: Difficulty was anticipated Future Recommendations: Recommend- induction with short-acting agent, and alternative techniques readily available        (including critical care time)  Medications Ordered in ED Medications  fentaNYL (SUBLIMAZE) injection 100 mcg (not administered)  fentaNYL (SUBLIMAZE) injection 100 mcg (not administered)  midazolam (VERSED) injection 2 mg (not administered)  midazolam (VERSED) injection 2 mg (not administered)  0.9 %  sodium chloride infusion (not administered)  enoxaparin (LOVENOX) injection 40 mg (not administered)  famotidine (PEPCID) IVPB 20 mg premix (not administered)  levothyroxine (SYNTHROID, LEVOTHROID) tablet 50 mcg (not administered)  lithium carbonate (LITHOBID) CR tablet 600 mg (not administered)  piperacillin-tazobactam (ZOSYN) IVPB 3.375 g (not administered)  norepinephrine (LEVOPHED) 4 mg in dextrose 5 % 250 mL (0.016 mg/mL) infusion (2 mcg/min Intravenous Restarted 01/16/17 0345)  chlorhexidine gluconate (MEDLINE KIT) (PERIDEX) 0.12 % solution 15 mL (not administered)  MEDLINE mouth rinse (not administered)  etomidate (AMIDATE) injection (20 mg Intravenous Given 01/15/17 2301)  rocuronium (ZEMURON) injection (100 mg Intravenous Given 01/15/17 2301)  norepinephrine (LEVOPHED) 4 mg in dextrose 5 % 250 mL (0.016 mg/mL) infusion (0 mcg/min Intravenous Stopping Infusion hung by another clincian 01/16/17 0259)  sodium chloride  0.9 % bolus 1,000 mL (0 mLs Intravenous Stopped 01/16/17 0142)  vancomycin (VANCOCIN) 1,500 mg in sodium chloride 0.9 % 500 mL IVPB (0 mg Intravenous Stopped 01/16/17 0306)  piperacillin-tazobactam (ZOSYN) IVPB 3.375 g (0 g Intravenous Stopped 01/16/17 0102)  iopamidol (ISOVUE-370) 76 % injection (100 mLs  Contrast Given 01/16/17 0009)  lactated ringers bolus 1,000 mL (0 mLs Intravenous Stopped 01/16/17 0410)     Initial Impression / Assessment and Plan / ED Course  I have reviewed the triage vital signs and the nursing notes.  Pertinent labs & imaging results that were available during my care of the patient were reviewed by me and considered in my medical decision making (see chart for details).     46 yo F with a chief complaint of unresponsiveness. Patient was given Narcan with no improvement. GCS of 3. Intubated at bedside.  Questionable pneumonia on chest x-ray. With hypotension started on broad-spectrum IV antibiotics. CT of the head and chest with likely multifocal pneumonia but no other concerning processes including PE.  Discussed with critical care for admission.  CRITICAL CARE Performed by: Cecilio Asper   Total critical care time: 80 minutes  Critical care time was exclusive of separately billable procedures and treating other patients.  Critical care was necessary to treat or prevent imminent or life-threatening deterioration.  Critical care was time spent personally by me on the following activities: development of treatment plan with patient and/or surrogate as well as nursing, discussions with consultants, evaluation of patient's response to treatment, examination of patient, obtaining history from patient or surrogate, ordering and performing treatments and interventions, ordering and review of laboratory studies, ordering and review of radiographic studies, pulse oximetry and re-evaluation of patient's condition.  The patients results and plan were reviewed and  discussed.   Any x-rays performed were independently reviewed by myself.   Differential diagnosis were considered with the presenting HPI.  Medications  fentaNYL (SUBLIMAZE) injection 100 mcg (not administered)  fentaNYL (SUBLIMAZE) injection 100 mcg (not administered)  midazolam (VERSED) injection 2 mg (not administered)  midazolam (VERSED) injection 2 mg (not administered)  0.9 %  sodium chloride infusion (not administered)  enoxaparin (LOVENOX) injection 40 mg (not administered)  famotidine (PEPCID) IVPB 20 mg premix (not administered)  levothyroxine (SYNTHROID, LEVOTHROID) tablet 50 mcg (not administered)  lithium carbonate (LITHOBID) CR tablet 600 mg (not administered)  piperacillin-tazobactam (ZOSYN) IVPB 3.375 g (not administered)  norepinephrine (LEVOPHED) 4 mg in dextrose 5 % 250 mL (0.016 mg/mL) infusion (2 mcg/min Intravenous Restarted 01/16/17 0345)  chlorhexidine gluconate (MEDLINE KIT) (PERIDEX) 0.12 % solution 15 mL (not administered)  MEDLINE mouth rinse (not administered)  etomidate (AMIDATE) injection (20 mg Intravenous Given 01/15/17 2301)  rocuronium (ZEMURON) injection (100 mg Intravenous Given 01/15/17 2301)  norepinephrine (LEVOPHED) 4 mg in dextrose 5 % 250 mL (0.016 mg/mL) infusion (0 mcg/min Intravenous Stopping Infusion hung by another clincian 01/16/17 0259)  sodium chloride 0.9 % bolus 1,000 mL (0 mLs Intravenous Stopped 01/16/17 0142)  vancomycin (VANCOCIN) 1,500 mg in sodium chloride 0.9 % 500 mL IVPB (0 mg Intravenous Stopped 01/16/17 0306)  piperacillin-tazobactam (ZOSYN) IVPB 3.375 g (0 g Intravenous Stopped 01/16/17 0102)  iopamidol (ISOVUE-370) 76 % injection (100 mLs  Contrast Given 01/16/17 0009)  lactated ringers bolus 1,000 mL (0 mLs Intravenous Stopped 01/16/17 0410)    Vitals:   01/16/17 0345 01/16/17 0400 01/16/17 0415 01/16/17 0448  BP: (!) 82/51 99/60 105/69   Pulse: 74 68 66   Resp: _0 Temp:    (!) 97.3 F (36.3 C)  TempSrc:      SpO2:  99% 100% 100%   Weight:      Height:        Final diagnoses:  Acute respiratory failure with hypoxia and hypercapnia ([facility])  Septic shock ([facility])    Admission/ observation were discussed with the admitting physician, patient and/or family and they are comfortable with the plan.   Final Clinical Impressions(s) / ED Diagnoses   Final diagnoses:  Acute respiratory failure with hypoxia and hypercapnia ([place])  Septic shock [facility])    New Prescriptions Current Discharge Medication List       Deno Etienne, DO 01/16/17 0451
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
## 2017-01-16 ENCOUNTER — Inpatient Hospital Stay (HOSPITAL_COMMUNITY): Payer: Medicaid Other

## 2017-01-16 ENCOUNTER — Encounter (HOSPITAL_COMMUNITY): Payer: Self-pay | Admitting: Radiology

## 2017-01-16 ENCOUNTER — Emergency Department (HOSPITAL_COMMUNITY): Payer: Medicaid Other

## 2017-01-16 DIAGNOSIS — J9602 Acute respiratory failure with hypercapnia: Secondary | ICD-10-CM

## 2017-01-16 DIAGNOSIS — F319 Bipolar disorder, unspecified: Secondary | ICD-10-CM | POA: Diagnosis not present

## 2017-01-16 DIAGNOSIS — R402112 Coma scale, eyes open, never, at arrival to emergency department: Secondary | ICD-10-CM | POA: Diagnosis present

## 2017-01-16 DIAGNOSIS — F1410 Cocaine abuse, uncomplicated: Secondary | ICD-10-CM | POA: Diagnosis present

## 2017-01-16 DIAGNOSIS — E870 Hyperosmolality and hypernatremia: Secondary | ICD-10-CM | POA: Diagnosis present

## 2017-01-16 DIAGNOSIS — E7220 Disorder of urea cycle metabolism, unspecified: Secondary | ICD-10-CM | POA: Diagnosis present

## 2017-01-16 DIAGNOSIS — E669 Obesity, unspecified: Secondary | ICD-10-CM | POA: Diagnosis present

## 2017-01-16 DIAGNOSIS — R6521 Severe sepsis with septic shock: Secondary | ICD-10-CM | POA: Diagnosis present

## 2017-01-16 DIAGNOSIS — Z915 Personal history of self-harm: Secondary | ICD-10-CM | POA: Diagnosis not present

## 2017-01-16 DIAGNOSIS — F11229 Opioid dependence with intoxication, unspecified: Secondary | ICD-10-CM | POA: Diagnosis not present

## 2017-01-16 DIAGNOSIS — M549 Dorsalgia, unspecified: Secondary | ICD-10-CM | POA: Diagnosis not present

## 2017-01-16 DIAGNOSIS — G92 Toxic encephalopathy: Secondary | ICD-10-CM | POA: Diagnosis present

## 2017-01-16 DIAGNOSIS — T40604S Poisoning by unspecified narcotics, undetermined, sequela: Secondary | ICD-10-CM | POA: Diagnosis not present

## 2017-01-16 DIAGNOSIS — Z8541 Personal history of malignant neoplasm of cervix uteri: Secondary | ICD-10-CM | POA: Diagnosis not present

## 2017-01-16 DIAGNOSIS — E878 Other disorders of electrolyte and fluid balance, not elsewhere classified: Secondary | ICD-10-CM | POA: Diagnosis present

## 2017-01-16 DIAGNOSIS — G894 Chronic pain syndrome: Secondary | ICD-10-CM | POA: Diagnosis present

## 2017-01-16 DIAGNOSIS — Z9114 Patient's other noncompliance with medication regimen: Secondary | ICD-10-CM | POA: Diagnosis not present

## 2017-01-16 DIAGNOSIS — E039 Hypothyroidism, unspecified: Secondary | ICD-10-CM | POA: Diagnosis present

## 2017-01-16 DIAGNOSIS — J9601 Acute respiratory failure with hypoxia: Secondary | ICD-10-CM | POA: Diagnosis not present

## 2017-01-16 DIAGNOSIS — Z6841 Body Mass Index (BMI) 40.0 and over, adult: Secondary | ICD-10-CM | POA: Diagnosis not present

## 2017-01-16 DIAGNOSIS — J690 Pneumonitis due to inhalation of food and vomit: Secondary | ICD-10-CM | POA: Diagnosis present

## 2017-01-16 DIAGNOSIS — A419 Sepsis, unspecified organism: Secondary | ICD-10-CM

## 2017-01-16 DIAGNOSIS — Z87891 Personal history of nicotine dependence: Secondary | ICD-10-CM | POA: Diagnosis not present

## 2017-01-16 DIAGNOSIS — E119 Type 2 diabetes mellitus without complications: Secondary | ICD-10-CM | POA: Diagnosis present

## 2017-01-16 DIAGNOSIS — M545 Low back pain: Secondary | ICD-10-CM | POA: Diagnosis not present

## 2017-01-16 DIAGNOSIS — D649 Anemia, unspecified: Secondary | ICD-10-CM | POA: Diagnosis present

## 2017-01-16 DIAGNOSIS — E872 Acidosis: Secondary | ICD-10-CM

## 2017-01-16 DIAGNOSIS — G8929 Other chronic pain: Secondary | ICD-10-CM | POA: Diagnosis not present

## 2017-01-16 DIAGNOSIS — T40601A Poisoning by unspecified narcotics, accidental (unintentional), initial encounter: Secondary | ICD-10-CM | POA: Diagnosis present

## 2017-01-16 DIAGNOSIS — R402212 Coma scale, best verbal response, none, at arrival to emergency department: Secondary | ICD-10-CM | POA: Diagnosis present

## 2017-01-16 DIAGNOSIS — T829XXA Unspecified complication of cardiac and vascular prosthetic device, implant and graft, initial encounter: Secondary | ICD-10-CM

## 2017-01-16 DIAGNOSIS — T829XXS Unspecified complication of cardiac and vascular prosthetic device, implant and graft, sequela: Secondary | ICD-10-CM | POA: Diagnosis not present

## 2017-01-16 DIAGNOSIS — R402312 Coma scale, best motor response, none, at arrival to emergency department: Secondary | ICD-10-CM | POA: Diagnosis present

## 2017-01-16 DIAGNOSIS — R4182 Altered mental status, unspecified: Secondary | ICD-10-CM | POA: Diagnosis present

## 2017-01-16 DIAGNOSIS — F3160 Bipolar disorder, current episode mixed, unspecified: Secondary | ICD-10-CM | POA: Diagnosis present

## 2017-01-16 LAB — COMPREHENSIVE METABOLIC PANEL
ALK PHOS: 84 U/L (ref 38–126)
ALT: 17 U/L (ref 14–54)
ANION GAP: 9 (ref 5–15)
AST: 22 U/L (ref 15–41)
Albumin: 3.3 g/dL — ABNORMAL LOW (ref 3.5–5.0)
BILIRUBIN TOTAL: 0.5 mg/dL (ref 0.3–1.2)
BUN: 14 mg/dL (ref 6–20)
CALCIUM: 7.6 mg/dL — AB (ref 8.9–10.3)
CO2: 23 mmol/L (ref 22–32)
Chloride: 107 mmol/L (ref 101–111)
Creatinine, Ser: 1.03 mg/dL — ABNORMAL HIGH (ref 0.44–1.00)
GFR calc non Af Amer: >60 mL/min (ref >60)
Glucose, Bld: 258 mg/dL — ABNORMAL HIGH (ref 65–99)
Potassium: 4.2 mmol/L (ref 3.5–5.1)
SODIUM: 139 mmol/L (ref 135–145)
TOTAL PROTEIN: 6.1 g/dL — AB (ref 6.5–8.1)

## 2017-01-16 LAB — RAPID URINE DRUG SCREEN, HOSP PERFORMED
Amphetamines: NOT DETECTED
BARBITURATES: NOT DETECTED
Benzodiazepines: POSITIVE — AB
Cocaine: POSITIVE — AB
Opiates: POSITIVE — AB
Tetrahydrocannabinol: NOT DETECTED

## 2017-01-16 LAB — SALICYLATE LEVEL

## 2017-01-16 LAB — MAGNESIUM
MAGNESIUM: 2.3 mg/dL (ref 1.7–2.4)
MAGNESIUM: 2.4 mg/dL (ref 1.7–2.4)
Magnesium: 2.3 mg/dL (ref 1.7–2.4)

## 2017-01-16 LAB — BASIC METABOLIC PANEL
Anion gap: 3 — ABNORMAL LOW (ref 5–15)
BUN: 11 mg/dL (ref 6–20)
CALCIUM: 7.4 mg/dL — AB (ref 8.9–10.3)
CHLORIDE: 110 mmol/L (ref 101–111)
CO2: 28 mmol/L (ref 22–32)
CREATININE: 0.81 mg/dL (ref 0.44–1.00)
GFR calc non Af Amer: >60 mL/min (ref >60)
GLUCOSE: 86 mg/dL (ref 65–99)
Potassium: 4.4 mmol/L (ref 3.5–5.1)
Sodium: 141 mmol/L (ref 135–145)

## 2017-01-16 LAB — GLUCOSE, CAPILLARY
GLUCOSE-CAPILLARY: 89 mg/dL (ref 65–99)
GLUCOSE-CAPILLARY: 97 mg/dL (ref 65–99)
GLUCOSE-CAPILLARY: 111 mg/dL — AB (ref 65–99)
GLUCOSE-CAPILLARY: 107 mg/dL — AB (ref 65–99)
Glucose-Capillary: 85 mg/dL (ref 65–99)
Glucose-Capillary: 103 mg/dL — ABNORMAL HIGH (ref 65–99)
Glucose-Capillary: 115 mg/dL — ABNORMAL HIGH (ref 65–99)

## 2017-01-16 LAB — CBC WITH DIFFERENTIAL/PLATELET
BASOS PCT: 0 %
Basophils Absolute: 0 10*3/uL (ref 0.0–0.1)
EOS ABS: 0.1 10*3/uL (ref 0.0–0.7)
Eosinophils Relative: 0 %
HCT: 36.9 % (ref 36.0–46.0)
HEMOGLOBIN: 11.6 g/dL — AB (ref 12.0–15.0)
Lymphocytes Relative: 7 %
Lymphs Abs: 1.2 10*3/uL (ref 0.7–4.0)
MCH: 28.2 pg (ref 26.0–34.0)
MCHC: 31.4 g/dL (ref 30.0–36.0)
MCV: 89.8 fL (ref 78.0–100.0)
Monocytes Absolute: 0.6 10*3/uL (ref 0.1–1.0)
Monocytes Relative: 3 %
NEUTROS PCT: 90 %
Neutro Abs: 15 10*3/uL — ABNORMAL HIGH (ref 1.7–7.7)
Platelets: 244 10*3/uL (ref 150–400)
RBC: 4.11 MIL/uL (ref 3.87–5.11)
RDW: 15.9 % — ABNORMAL HIGH (ref 11.5–15.5)
WBC: 16.8 10*3/uL — AB (ref 4.0–10.5)

## 2017-01-16 LAB — URINALYSIS, ROUTINE W REFLEX MICROSCOPIC
BILIRUBIN URINE: NEGATIVE
GLUCOSE, UA: 150 mg/dL — AB
Hgb urine dipstick: NEGATIVE
KETONES UR: NEGATIVE mg/dL
Leukocytes, UA: NEGATIVE
NITRITE: NEGATIVE
PH: 5 (ref 5.0–8.0)
Protein, ur: 30 mg/dL — AB
Specific Gravity, Urine: 1.014 (ref 1.005–1.030)

## 2017-01-16 LAB — CBC
HEMATOCRIT: 35.2 % — AB (ref 36.0–46.0)
HEMOGLOBIN: 11 g/dL — AB (ref 12.0–15.0)
MCH: 27.4 pg (ref 26.0–34.0)
MCHC: 30.7 g/dL (ref 30.0–36.0)
MCV: 89.3 fL (ref 78.0–100.0)
Platelets: 212 10*3/uL (ref 150–400)
RBC: 3.94 MIL/uL (ref 3.87–5.11)
RDW: 15.9 % — AB (ref 11.5–15.5)
WBC: 9.6 10*3/uL (ref 4.0–10.5)

## 2017-01-16 LAB — POCT I-STAT 3, ART BLOOD GAS (G3+)
BICARBONATE: 24.5 mmol/L (ref 20.0–28.0)
O2 Saturation: 98 %
PH ART: 7.416 (ref 7.350–7.450)
TCO2: 26 mmol/L (ref 0–100)
pCO2 arterial: 38.2 mmHg (ref 32.0–48.0)
pO2, Arterial: 110 mmHg — ABNORMAL HIGH (ref 83.0–108.0)

## 2017-01-16 LAB — PHOSPHORUS
PHOSPHORUS: 3.3 mg/dL (ref 2.5–4.6)
PHOSPHORUS: 1.8 mg/dL — AB (ref 2.5–4.6)
PHOSPHORUS: 1.7 mg/dL — AB (ref 2.5–4.6)

## 2017-01-16 LAB — AMMONIA: Ammonia: 62 umol/L — ABNORMAL HIGH (ref 9–35)

## 2017-01-16 LAB — ETHANOL: Alcohol, Ethyl (B): <5 mg/dL (ref <5)

## 2017-01-16 LAB — HIV ANTIBODY (ROUTINE TESTING W REFLEX): HIV SCREEN 4TH GENERATION: NONREACTIVE

## 2017-01-16 LAB — MRSA PCR SCREENING: MRSA by PCR: NEGATIVE

## 2017-01-16 LAB — ACETAMINOPHEN LEVEL

## 2017-01-16 MED ORDER — SODIUM CHLORIDE 0.9 % IV BOLUS (SEPSIS)
500.0000 mL | Freq: Once | INTRAVENOUS | Status: AC
  Administered 2017-01-16: 500 mL via INTRAVENOUS

## 2017-01-16 MED ORDER — FAMOTIDINE IN NACL 20-0.9 MG/50ML-% IV SOLN
20.0000 mg | Freq: Two times a day (BID) | INTRAVENOUS | Status: DC
  Administered 2017-01-16 – 2017-01-18 (×5): 20 mg via INTRAVENOUS
  Filled 2017-01-16 (×6): qty 50

## 2017-01-16 MED ORDER — VITAL HIGH PROTEIN PO LIQD
1000.0000 mL | ORAL | Status: DC
  Administered 2017-01-16 – 2017-01-17 (×2): 1000 mL

## 2017-01-16 MED ORDER — PRO-STAT SUGAR FREE PO LIQD
30.0000 mL | Freq: Two times a day (BID) | ORAL | Status: DC
  Administered 2017-01-16: 30 mL
  Filled 2017-01-16: qty 30

## 2017-01-16 MED ORDER — LEVOTHYROXINE SODIUM 50 MCG PO TABS
50.0000 ug | ORAL_TABLET | Freq: Every day | ORAL | Status: DC
  Administered 2017-01-16 – 2017-01-20 (×5): 50 ug via ORAL
  Filled 2017-01-16 (×6): qty 1

## 2017-01-16 MED ORDER — ORAL CARE MOUTH RINSE
15.0000 mL | Freq: Four times a day (QID) | OROMUCOSAL | Status: DC
  Administered 2017-01-16 – 2017-01-18 (×7): 15 mL via OROMUCOSAL

## 2017-01-16 MED ORDER — ENOXAPARIN SODIUM 40 MG/0.4ML ~~LOC~~ SOLN
40.0000 mg | Freq: Every day | SUBCUTANEOUS | Status: DC
  Administered 2017-01-16 – 2017-01-20 (×5): 40 mg via SUBCUTANEOUS
  Filled 2017-01-16 (×5): qty 0.4

## 2017-01-16 MED ORDER — SODIUM CHLORIDE 0.9 % IV SOLN
INTRAVENOUS | Status: DC
  Administered 2017-01-16 – 2017-01-17 (×3): via INTRAVENOUS

## 2017-01-16 MED ORDER — SODIUM CHLORIDE 0.9 % IV SOLN: 250.0000 mL | INTRAVENOUS | Status: DC

## 2017-01-16 MED ORDER — SODIUM CHLORIDE 0.9 % IV SOLN
3.0000 g | Freq: Four times a day (QID) | INTRAVENOUS | Status: DC
  Administered 2017-01-16 – 2017-01-18 (×9): 3 g via INTRAVENOUS
  Filled 2017-01-16 (×10): qty 3

## 2017-01-16 MED ORDER — LACTATED RINGERS IV BOLUS (SEPSIS)
1000.0000 mL | Freq: Once | INTRAVENOUS | Status: AC
  Administered 2017-01-16: 1000 mL via INTRAVENOUS

## 2017-01-16 MED ORDER — SODIUM CHLORIDE 0.9 % IV SOLN: 250.0000 mL | INTRAVENOUS | Status: DC | PRN

## 2017-01-16 MED ORDER — IOPAMIDOL (ISOVUE-370) INJECTION 76%
INTRAVENOUS | Status: AC
  Administered 2017-01-16: 100 mL
  Filled 2017-01-16: qty 100

## 2017-01-16 MED ORDER — VITAL HIGH PROTEIN PO LIQD
1000.0000 mL | ORAL | Status: DC
  Administered 2017-01-16: 1000 mL

## 2017-01-16 MED ORDER — PIPERACILLIN-TAZOBACTAM 3.375 G IVPB
3.3750 g | Freq: Three times a day (TID) | INTRAVENOUS | Status: DC
  Administered 2017-01-16: 3.375 g via INTRAVENOUS
  Filled 2017-01-16 (×2): qty 50

## 2017-01-16 MED ORDER — LITHIUM CARBONATE ER 300 MG PO TBCR
600.0000 mg | EXTENDED_RELEASE_TABLET | Freq: Two times a day (BID) | ORAL | Status: DC
  Administered 2017-01-16: 600 mg via ORAL
  Filled 2017-01-16 (×2): qty 2

## 2017-01-16 MED ORDER — LITHIUM CARBONATE 300 MG PO CAPS
600.0000 mg | ORAL_CAPSULE | Freq: Two times a day (BID) | ORAL | Status: DC
  Administered 2017-01-16 – 2017-01-19 (×6): 600 mg via ORAL
  Filled 2017-01-16 (×6): qty 2

## 2017-01-16 MED ORDER — FAMOTIDINE IN NACL 20-0.9 MG/50ML-% IV SOLN
20.0000 mg | Freq: Every day | INTRAVENOUS | Status: DC
  Administered 2017-01-16: 20 mg via INTRAVENOUS
  Filled 2017-01-16: qty 50

## 2017-01-16 MED ORDER — DEXTROSE 5 % IV SOLN
0.0000 ug/min | INTRAVENOUS | Status: DC
  Filled 2017-01-16 (×2): qty 4

## 2017-01-16 MED ORDER — CHLORHEXIDINE GLUCONATE 0.12% ORAL RINSE (MEDLINE KIT)
15.0000 mL | Freq: Two times a day (BID) | OROMUCOSAL | Status: DC
  Administered 2017-01-16 – 2017-01-19 (×5): 15 mL via OROMUCOSAL

## 2017-01-16 NOTE — Consult Note (Signed)
Reason for ICU admission: Acute hypoxic resp failure  In summary: 46 yo F with Hx of bipolar disorder and chronic back pain presented because of unresponsive, minimal response to narcan. Intubated for airway protection. Developed hypotension afterwards, possible aspiration  On my assessment: Hemodynamically stable on low dose norepi Totally unresponsive, non-focal S1S2 regular, no murmur Basal crackles, no wheeze Abdomen soft not tender Warm and well perfused  I reviewed imaging and labs  Patient is critically ill and will be admitted to ICU. I am managing the patient for the following  1. Acute hypoxic resp failure 2. Septic shock 3. Aspiration pneumonia 4. Possible Drug overdose   Plan: - propofol for sedation if needed and prn analgesics. Continue lithium - intubated, get ABGs, CXR and VAP protocol.  - volume resuscitation, monitor UOP - send cultures and start zosyn - NPO, OG to LIS - ISS and levothyroxine  - Lovenox and PUD prophylaxis  I spent 44 min of critical care time managing the patient  Samul Dada, MD CCM attending

## 2017-01-16 NOTE — ED Notes (Signed)
Critical MD at bedside.

## 2017-01-16 NOTE — Procedures (Signed)
Central line insertion  Indication: shock / hypotension  Emergent procedure, time out Complete sterile condition  Local lidocaine injection US guided insertion of RT Ranlo central line  CXR ordered, patient tolerated the procedure  Samul Dada, MD CCM attending

## 2017-01-16 NOTE — Progress Notes (Signed)
Initial Nutrition Assessment  DOCUMENTATION CODES:  Morbid obesity  INTERVENTION:  Continue TF via OGT with VHP. Increase to goal rate of 55 ml/h (1320 ml per day) and D/C prostat. Provides 1320 kcals, 116 gm protein, 1104 ml free water daily.  NUTRITION DIAGNOSIS:  Inadequate oral intake related to inability to eat as evidenced by NPO status.  GOAL:  Provide needs based on ASPEN/SCCM guidelines  MONITOR:  Diet advancement, Vent status, Weight trends, Labs, TF tolerance, I & O's  REASON FOR ASSESSMENT:  Consult Enteral/tube feeding initiation and management  ASSESSMENT:  46 y/o female PMHx Anxiety, Bipolar disorder, Hep B, DM. Presented via EMS after doing illegal drugs. Found unresponsive and intubated in ED. Afterwards, developed septic shock w/ ALI. LIkely aspiration PNA. RD consulted for tube feeding  Pt unresponsive and no historians present in room.   Per review of chart, pt has been gaining weight over the past few years, up 30 lbs in 1 year. No mention of patient having decreased intake recently  VHP infusing at 40 cc/hr. Abdomen non distended  Nfpe: wdl  Patient is currently intubated on ventilator support MV: 11.8 L/min Temp (24hrs), Avg:97.6 F (36.4 C), Min:97.3 F (36.3 C), Max:98.1 F (36.7 C)  Propofol: None  Labs: Bgs stabilizing at 90-110, Albumin 3.3. Phos 1.8 Meds: IVF, IV abx, nor epi, H2RA   Recent Labs Lab 01/15/17 2315 01/15/17 2326 01/16/17 0334 01/16/17 1040  NA 139 140 141  --   K 4.2 4.3 4.4  --   CL 107 102 110  --   CO2 23  --  28  --   BUN 14 16 11   --   CREATININE 1.03* 0.90 0.81  --   CALCIUM 7.6*  --  7.4*  --   MG  --   --  2.3 2.3  PHOS  --   --  3.3 1.8*  GLUCOSE 258* 277* 86  --     Diet Order:  Diet NPO time specified  Skin:  Reviewed, no issues  Last BM:  Unknown  Height:  Ht Readings from Last 1 Encounters:  01/16/17 5\' 4"  (1.626 m)   Weight:  Wt Readings from Last 1 Encounters:  01/16/17 240 lb 1.3 oz  (108.9 kg)   Wt Readings from Last 10 Encounters:  01/16/17 240 lb 1.3 oz (108.9 kg)  12/10/16 200 lb (90.7 kg)  10/14/16 235 lb (106.6 kg)  01/23/16 210 lb 9.6 oz (95.5 kg)  12/16/15 209 lb (94.8 kg)  12/02/15 205 lb 11.2 oz (93.3 kg)  10/02/15 216 lb (98 kg)  04/11/15 190 lb (86.2 kg)  11/11/14 200 lb (90.7 kg)  03/05/14 202 lb (91.6 kg)   Ideal Body Weight:  54.54 kg  BMI:  Body mass index is 41.21 kg/m.  Estimated Nutritional Needs:  Kcal:  1200-1525 kcals (11-14 kcal/kg bw) Protein:  109-136 kcals (2-2.5 g/kg ibw) Fluid:  Per MD  EDUCATION NEEDS:  No education needs identified at this time  Burtis Junes RD, LDN, [facility] Pager: [phone] 01/16/2017 1:36 PM

## 2017-01-16 NOTE — Consult Note (Addendum)
Reason for ICU admission: Acute hypoxic resp failure  In summary: 46 yo F with Hx of bipolar disorder and chronic back pain presented because of unresponsive, minimal response to narcan. Intubated for airway protection. Developed hypotension afterwards, possible aspiration  Subjective: remains calm on vent  General: rass -3 Neuro: rass -3, perr 5 mm HEENT: no jvd, obese, ett PULM: coarse bilateral BS CV:  s12 s2 rrr not atchy distant GI: soft, obese, low BS, no r Extremities: min non pit edema   Patient is critically ill and will be admitted to ICU. I am managing the patient for the following  1. Acute hypoxic resp failure / ALI 2. Septic shock 3. Aspiration pneumonia 4. Possible Drug overdose  5. Encpeh, r/o CVA 6. Shock, pos pressure likely related  Plan: -keep plat less 30 -abg noted, increase MV, repeat abg -no SBt able -pcxr in am for infiltrates -even to pos balance -levophed to map 60, on 1 mic, bolus x 1 , re assess need, obtain cvp -may need cortisol -trop neg, need ecg x 1 with sock on cocaine -feed pt -some improvement ins neurostatus,m if not progressing will need mri brain / eeg -saline okay at 100, chem in am  -ppi -get coags, dont have them , may need a line -if needed use benzo with coaine, and fent prn - from home, dc zosyn, ad dunasyn, await sputum -BC if spke   Ccm time 50 min   Lavon Paganini. Titus Mould, MD, [place] Pgr: [facility]

## 2017-01-16 NOTE — Progress Notes (Signed)
Pharmacy Antibiotic Note  Kylie [name] is a 46 y.o. female admitted on 01/15/2017 with overdose/possible aspiration PNA.  Pharmacy has been consulted for Zosyn dosing. WBC is elevated. Pt hypotensive.   Plan: Zosyn 3.375G IV q8h to be infused over 4 hours Trend WBC, temp, renal function  F/U infectious work-up  Temp (24hrs), Avg:98.1 F (36.7 C), Min:98.1 F (36.7 C), Max:98.1 F (36.7 C)   Recent Labs Lab 01/15/17 2312 01/15/17 2315 01/15/17 2326  WBC  --  16.8*  --   CREATININE  --  1.03* 0.90  LATICACIDVEN 1.79  --   --     CrCl cannot be calculated (Unknown ideal weight.).    Allergies  Allergen Reactions  . Pregabalin Anaphylaxis  . Hydrocodone Itching and Rash  . Citalopram     Worsened anxiety  . Ibuprofen     Stomach pain  . Naproxen Swelling    troat and fingers swelling   . Povidone-Iodine Itching and Rash    Narda Bonds 01/16/2017 1:03 AM

## 2017-01-17 ENCOUNTER — Inpatient Hospital Stay (HOSPITAL_COMMUNITY): Payer: Medicaid Other

## 2017-01-17 ENCOUNTER — Encounter (HOSPITAL_COMMUNITY): Payer: Self-pay | Admitting: General Practice

## 2017-01-17 DIAGNOSIS — J9601 Acute respiratory failure with hypoxia: Secondary | ICD-10-CM

## 2017-01-17 DIAGNOSIS — R6521 Severe sepsis with septic shock: Secondary | ICD-10-CM

## 2017-01-17 DIAGNOSIS — A419 Sepsis, unspecified organism: Secondary | ICD-10-CM

## 2017-01-17 DIAGNOSIS — T829XXS Unspecified complication of cardiac and vascular prosthetic device, implant and graft, sequela: Secondary | ICD-10-CM

## 2017-01-17 DIAGNOSIS — J9602 Acute respiratory failure with hypercapnia: Secondary | ICD-10-CM

## 2017-01-17 LAB — CBC WITH DIFFERENTIAL/PLATELET
BASOS PCT: 0 %
Basophils Absolute: 0 10*3/uL (ref 0.0–0.1)
EOS ABS: 0 10*3/uL (ref 0.0–0.7)
EOS PCT: 1 %
HCT: 33.7 % — ABNORMAL LOW (ref 36.0–46.0)
HEMOGLOBIN: 10.4 g/dL — AB (ref 12.0–15.0)
LYMPHS ABS: 1.7 10*3/uL (ref 0.7–4.0)
Lymphocytes Relative: 29 %
MCH: 27.5 pg (ref 26.0–34.0)
MCHC: 30.9 g/dL (ref 30.0–36.0)
MCV: 89.2 fL (ref 78.0–100.0)
MONOS PCT: 6 %
Monocytes Absolute: 0.4 10*3/uL (ref 0.1–1.0)
NEUTROS PCT: 64 %
Neutro Abs: 3.8 10*3/uL (ref 1.7–7.7)
PLATELETS: 188 10*3/uL (ref 150–400)
RBC: 3.78 MIL/uL — ABNORMAL LOW (ref 3.87–5.11)
RDW: 16.5 % — AB (ref 11.5–15.5)
WBC: 5.9 10*3/uL (ref 4.0–10.5)

## 2017-01-17 LAB — URINE CULTURE

## 2017-01-17 LAB — POCT I-STAT 3, ART BLOOD GAS (G3+)
Acid-Base Excess: 1 mmol/L (ref 0.0–2.0)
BICARBONATE: 25.5 mmol/L (ref 20.0–28.0)
O2 Saturation: 97 %
PCO2 ART: 37.7 mmHg (ref 32.0–48.0)
TCO2: 27 mmol/L (ref 0–100)
pH, Arterial: 7.439 (ref 7.350–7.450)
pO2, Arterial: 87 mmHg (ref 83.0–108.0)

## 2017-01-17 LAB — BLOOD CULTURE ID PANEL (REFLEXED)
Acinetobacter baumannii: NOT DETECTED
CANDIDA ALBICANS: NOT DETECTED
CANDIDA KRUSEI: NOT DETECTED
CANDIDA PARAPSILOSIS: NOT DETECTED
CANDIDA TROPICALIS: NOT DETECTED
CARBAPENEM RESISTANCE: NOT DETECTED
Candida glabrata: NOT DETECTED
ENTEROBACTER CLOACAE COMPLEX: NOT DETECTED
ENTEROBACTERIACEAE SPECIES: NOT DETECTED
ENTEROCOCCUS SPECIES: NOT DETECTED
Escherichia coli: NOT DETECTED
Haemophilus influenzae: NOT DETECTED
KLEBSIELLA OXYTOCA: NOT DETECTED
KLEBSIELLA PNEUMONIAE: NOT DETECTED
Listeria monocytogenes: NOT DETECTED
Methicillin resistance: NOT DETECTED
Neisseria meningitidis: NOT DETECTED
PROTEUS SPECIES: NOT DETECTED
PSEUDOMONAS AERUGINOSA: NOT DETECTED
STREPTOCOCCUS AGALACTIAE: NOT DETECTED
STREPTOCOCCUS PNEUMONIAE: NOT DETECTED
Serratia marcescens: NOT DETECTED
Staphylococcus aureus (BCID): NOT DETECTED
Staphylococcus species: NOT DETECTED
Streptococcus pyogenes: NOT DETECTED
Streptococcus species: NOT DETECTED
Vancomycin resistance: NOT DETECTED

## 2017-01-17 LAB — GLUCOSE, CAPILLARY
GLUCOSE-CAPILLARY: 83 mg/dL (ref 65–99)
GLUCOSE-CAPILLARY: 120 mg/dL — AB (ref 65–99)
Glucose-Capillary: 114 mg/dL — ABNORMAL HIGH (ref 65–99)
Glucose-Capillary: 107 mg/dL — ABNORMAL HIGH (ref 65–99)
Glucose-Capillary: 103 mg/dL — ABNORMAL HIGH (ref 65–99)

## 2017-01-17 LAB — BASIC METABOLIC PANEL
ANION GAP: 4 — AB (ref 5–15)
Anion gap: 3 — ABNORMAL LOW (ref 5–15)
BUN: 14 mg/dL (ref 6–20)
BUN: 10 mg/dL (ref 6–20)
CALCIUM: 7.8 mg/dL — AB (ref 8.9–10.3)
CALCIUM: 7.8 mg/dL — AB (ref 8.9–10.3)
CO2: 26 mmol/L (ref 22–32)
CO2: 27 mmol/L (ref 22–32)
CREATININE: 0.8 mg/dL (ref 0.44–1.00)
Chloride: 118 mmol/L — ABNORMAL HIGH (ref 101–111)
Chloride: 111 mmol/L (ref 101–111)
Creatinine, Ser: 0.8 mg/dL (ref 0.44–1.00)
GFR calc Af Amer: >60 mL/min (ref >60)
GFR calc non Af Amer: >60 mL/min (ref >60)
GLUCOSE: 82 mg/dL (ref 65–99)
Glucose, Bld: 109 mg/dL — ABNORMAL HIGH (ref 65–99)
POTASSIUM: 3.6 mmol/L (ref 3.5–5.1)
Potassium: 3.5 mmol/L (ref 3.5–5.1)
SODIUM: 147 mmol/L — AB (ref 135–145)
SODIUM: 142 mmol/L (ref 135–145)

## 2017-01-17 LAB — MAGNESIUM
MAGNESIUM: 2.4 mg/dL (ref 1.7–2.4)
MAGNESIUM: 2.4 mg/dL (ref 1.7–2.4)

## 2017-01-17 LAB — PHOSPHORUS
PHOSPHORUS: 1.4 mg/dL — AB (ref 2.5–4.6)
Phosphorus: 3.4 mg/dL (ref 2.5–4.6)

## 2017-01-17 LAB — LITHIUM LEVEL: LITHIUM LVL: 0.21 mmol/L — AB (ref 0.60–1.20)

## 2017-01-17 MED ORDER — POTASSIUM PHOSPHATES 15 MMOLE/5ML IV SOLN
30.0000 mmol | Freq: Once | INTRAVENOUS | Status: AC
  Administered 2017-01-17: 30 mmol via INTRAVENOUS
  Filled 2017-01-17: qty 10

## 2017-01-17 MED ORDER — METHADONE HCL 10 MG PO TABS
10.0000 mg | ORAL_TABLET | Freq: Two times a day (BID) | ORAL | Status: DC
  Administered 2017-01-17 – 2017-01-20 (×6): 10 mg
  Filled 2017-01-17 (×6): qty 1

## 2017-01-17 MED ORDER — DEXTROSE 5 % IV SOLN
INTRAVENOUS | Status: DC
  Administered 2017-01-17: 1000 mL via INTRAVENOUS
  Administered 2017-01-18: 50 mL via INTRAVENOUS
  Administered 2017-01-19: 02:00:00 via INTRAVENOUS

## 2017-01-17 NOTE — Consult Note (Addendum)
Reason for ICU admission: Acute hypoxic resp failure  In summary: 46 yo F with Hx of bipolar disorder and chronic back pain presented because of unresponsive, minimal response to narcan. Intubated for airway protection. Developed hypotension afterwards, possible aspiration  Subjective: remains calm on vent, neuro status has resolved Vitals:   01/17/17 0500 01/17/17 0600  BP: (!) 106/55 (!) 104/54  Pulse: 68 76  Resp: (!) 26 (!) 23  Temp:    SpO2: 97% 96%    General: awake, fc Neuro: calm, nonfocal approrpiate HEENT: jvd wnl, ett PULM: CTA CV: s1 s2 rrr  No r GI: soft, BS wnl, no r Extremities: no rash, no edema    Patient is critically ill and will be admitted to ICU. I am managing the patient for the following  1. Acute hypoxic resp failure / ALI 2. Septic shock resolved 3. Aspiration pneumonia 4. Possible Drug overdose  5. Encpeh resolving 6. Shock, pos pressure likely related 7. Hypernatremia 8. hypohos 9. 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 -add methadone likely 10 bid, avoid wd, get qtc   Ccm time 30 min   Lavon Paganini. Titus Mould, MD, [place] Pgr: [facility]

## 2017-01-17 NOTE — Procedures (Signed)
Extubation Procedure Note  Patient Details:   Name: Kylie [name] DOB: 1970/06/21 MRN: [id number]   Airway Documentation:     Evaluation  O2 sats: stable throughout Complications: No apparent complications Patient did tolerate procedure well. Bilateral Breath Sounds: Clear, Diminished   Yes the patient very anxious.  Extubated per dr. Janit Pagan verbal order.  Pt talking and placed on 4L O2  Verdon Ferrante V 01/17/2017, 10:21 AM

## 2017-01-17 NOTE — Progress Notes (Signed)
PHARMACY - PHYSICIAN COMMUNICATION CRITICAL VALUE ALERT - BLOOD CULTURE IDENTIFICATION (BCID)  Results for orders placed or performed during the hospital encounter of 01/15/17  Blood Culture ID Panel (Reflexed) (Collected: 01/15/2017 11:59 PM)  Result Value Ref Range   Enterococcus species NOT DETECTED NOT DETECTED   Vancomycin resistance NOT DETECTED NOT DETECTED   Listeria monocytogenes NOT DETECTED NOT DETECTED   Staphylococcus species NOT DETECTED NOT DETECTED   Staphylococcus aureus NOT DETECTED NOT DETECTED   Methicillin resistance NOT DETECTED NOT DETECTED   Streptococcus species NOT DETECTED NOT DETECTED   Streptococcus agalactiae NOT DETECTED NOT DETECTED   Streptococcus pneumoniae NOT DETECTED NOT DETECTED   Streptococcus pyogenes NOT DETECTED NOT DETECTED   Acinetobacter baumannii NOT DETECTED NOT DETECTED   Enterobacteriaceae species NOT DETECTED NOT DETECTED   Enterobacter cloacae complex NOT DETECTED NOT DETECTED   Escherichia coli NOT DETECTED NOT DETECTED   Klebsiella oxytoca NOT DETECTED NOT DETECTED   Klebsiella pneumoniae NOT DETECTED NOT DETECTED   Proteus species NOT DETECTED NOT DETECTED   Serratia marcescens NOT DETECTED NOT DETECTED   Carbapenem resistance NOT DETECTED NOT DETECTED   Haemophilus influenzae NOT DETECTED NOT DETECTED   Neisseria meningitidis NOT DETECTED NOT DETECTED   Pseudomonas aeruginosa NOT DETECTED NOT DETECTED   Candida albicans NOT DETECTED NOT DETECTED   Candida glabrata NOT DETECTED NOT DETECTED   Candida krusei NOT DETECTED NOT DETECTED   Candida parapsilosis NOT DETECTED NOT DETECTED   Candida tropicalis NOT DETECTED NOT DETECTED     Changes to prescribed antibiotics required: None likely contaminant   Harvel Quale 01/17/2017  12:36 PM

## 2017-01-18 ENCOUNTER — Inpatient Hospital Stay (HOSPITAL_COMMUNITY): Payer: Medicaid Other

## 2017-01-18 DIAGNOSIS — J690 Pneumonitis due to inhalation of food and vomit: Secondary | ICD-10-CM

## 2017-01-18 DIAGNOSIS — F319 Bipolar disorder, unspecified: Secondary | ICD-10-CM

## 2017-01-18 DIAGNOSIS — F11229 Opioid dependence with intoxication, unspecified: Secondary | ICD-10-CM

## 2017-01-18 DIAGNOSIS — T40604S Poisoning by unspecified narcotics, undetermined, sequela: Secondary | ICD-10-CM

## 2017-01-18 DIAGNOSIS — Z87891 Personal history of nicotine dependence: Secondary | ICD-10-CM

## 2017-01-18 DIAGNOSIS — M549 Dorsalgia, unspecified: Secondary | ICD-10-CM

## 2017-01-18 LAB — GLUCOSE, CAPILLARY
GLUCOSE-CAPILLARY: 107 mg/dL — AB (ref 65–99)
GLUCOSE-CAPILLARY: 80 mg/dL (ref 65–99)
GLUCOSE-CAPILLARY: 85 mg/dL (ref 65–99)
GLUCOSE-CAPILLARY: 95 mg/dL (ref 65–99)
Glucose-Capillary: 101 mg/dL — ABNORMAL HIGH (ref 65–99)
Glucose-Capillary: 77 mg/dL (ref 65–99)

## 2017-01-18 LAB — CULTURE, BLOOD (ROUTINE X 2): Special Requests: ADEQUATE

## 2017-01-18 LAB — BASIC METABOLIC PANEL
Anion gap: 4 — ABNORMAL LOW (ref 5–15)
BUN: 8 mg/dL (ref 6–20)
CHLORIDE: 110 mmol/L (ref 101–111)
CO2: 28 mmol/L (ref 22–32)
Calcium: 7.7 mg/dL — ABNORMAL LOW (ref 8.9–10.3)
Creatinine, Ser: 0.72 mg/dL (ref 0.44–1.00)
GFR calc non Af Amer: >60 mL/min (ref >60)
Glucose, Bld: 77 mg/dL (ref 65–99)
POTASSIUM: 3.5 mmol/L (ref 3.5–5.1)
SODIUM: 142 mmol/L (ref 135–145)

## 2017-01-18 LAB — CBC WITH DIFFERENTIAL/PLATELET
Basophils Absolute: 0 10*3/uL (ref 0.0–0.1)
Basophils Relative: 0 %
Eosinophils Absolute: 0.1 10*3/uL (ref 0.0–0.7)
Eosinophils Relative: 2 %
HEMATOCRIT: 32.2 % — AB (ref 36.0–46.0)
HEMOGLOBIN: 10 g/dL — AB (ref 12.0–15.0)
LYMPHS ABS: 2.3 10*3/uL (ref 0.7–4.0)
LYMPHS PCT: 35 %
MCH: 28.2 pg (ref 26.0–34.0)
MCHC: 31.1 g/dL (ref 30.0–36.0)
MCV: 90.7 fL (ref 78.0–100.0)
Monocytes Absolute: 0.4 10*3/uL (ref 0.1–1.0)
Monocytes Relative: 6 %
NEUTROS ABS: 3.9 10*3/uL (ref 1.7–7.7)
NEUTROS PCT: 57 %
Platelets: 187 10*3/uL (ref 150–400)
RBC: 3.55 MIL/uL — AB (ref 3.87–5.11)
RDW: 16.5 % — ABNORMAL HIGH (ref 11.5–15.5)
WBC: 6.7 10*3/uL (ref 4.0–10.5)

## 2017-01-18 LAB — CULTURE, RESPIRATORY W GRAM STAIN

## 2017-01-18 LAB — CULTURE, RESPIRATORY: CULTURE: NORMAL

## 2017-01-18 MED ORDER — AMOXICILLIN-POT CLAVULANATE 875-125 MG PO TABS
1.0000 | ORAL_TABLET | Freq: Two times a day (BID) | ORAL | Status: AC
  Administered 2017-01-18 – 2017-01-20 (×4): 1 via ORAL
  Filled 2017-01-18 (×4): qty 1

## 2017-01-18 MED ORDER — LACTULOSE 10 GM/15ML PO SOLN
20.0000 g | Freq: Two times a day (BID) | ORAL | Status: DC
  Administered 2017-01-18 – 2017-01-19 (×3): 20 g via ORAL
  Filled 2017-01-18 (×6): qty 30

## 2017-01-18 NOTE — Progress Notes (Addendum)
Called and attempted report. [id number]. Left number Nurse Edit will return call.

## 2017-01-18 NOTE — Progress Notes (Signed)
Pharmacy Antibiotic Note  Kylie [name] is a 46 y.o. female admitted on 01/15/2017 with overdose/possible aspiration PNA.  Patient has been on unasyn for possible aspiration post-extubation. Plan is to switch to oral therapy today as WBC is within normal limits and cultures continue to be negative.   Plan: Augmentin 875 q12h x 5 days total therapy  D/C Unasyn  Trend WBC, temp, renal function   Temp (24hrs), Avg:98.3 F (36.8 C), Min:97.6 F (36.4 C), Max:99.3 F (37.4 C)   Recent Labs Lab 01/15/17 2312  01/15/17 2315 01/15/17 2326 01/16/17 0334 01/17/17 0450 01/17/17 2219 01/18/17 0423  WBC  --   --  16.8*  --  9.6 5.9  --  6.7  CREATININE  --   < > 1.03* 0.90 0.81 0.80 0.80 0.72  LATICACIDVEN 1.79  --   --   --   --   --   --   --   < > = values in this interval not displayed.  Estimated Creatinine Clearance: 110.1 mL/min (by C-G formula based on SCr of 0.72 mg/dL).    Allergies  Allergen Reactions  . Pregabalin Anaphylaxis  . Hydrocodone Itching and Rash  . Citalopram Other (See Comments)    Worsened anxiety  . Ibuprofen Other (See Comments)    Stomach pain  . Naproxen Swelling and Other (See Comments)    troat and fingers swelling   . Povidone-Iodine Itching and Rash   8/18 vanc x1 8/18 zosyn x2 doses 8/18 unasyn >> 8/20 8/20 augmentin>>8/22  8/18 Bcx: NGTD 8/18 MRSA PCR: negative  8/18 BAL: normal resp flora 8/20 Ucx: pending  Jalene Mullet, Pharm.D. PGY1 Pharmacy Resident 01/18/2017 1:34 PM Main Pharmacy: [phone]   I discussed / reviewed the pharmacy note by Dr. Theda Belfast and I agree with the resident's findings and plans as documented.  Sloan Leiter, PharmD, BCPS Clinical Pharmacist Clinical phone 01/18/2017 until 3:30PM [phone] After hours, please call [phone]

## 2017-01-18 NOTE — Progress Notes (Signed)
Received report from [facility] on [id number].

## 2017-01-18 NOTE — Consult Note (Signed)
[facility] Consult   Reason for Consult:  Bipolar disorder and polysubstance abuse with intoxication Referring Physician:  Dr. Titus Mould Patient Identification: Kylie [name] MRN:  [id number] Principal Diagnosis: Bipolar I disorder, most recent episode mixed [facility]) Diagnosis:   Patient Active Problem List   Diagnosis Date Noted  . Acute respiratory failure with hypoxia and hypercapnia (HCC) [J96.01, J96.02] 01/16/2017  . Central line complication [U93.9XXA]   . Septic shock ([place]) [A41.9, R65.21]   . Acute respiratory acidosis [E87.2]   . Hypertriglyceridemia [E78.1] 10/14/2016  . Hyperglycemia [R73.9] 10/14/2016  . H/O abnormal mammogram [Z87.898] 09/29/2016  . Constipation [K59.00] 01/23/2016  . Generalized anxiety disorder [F41.1]   . Empyema lung ([place]) [J86.9]   . Mediastinal lymphadenopathy [R59.0]   . Other specified hypothyroidism [E03.8]   . Chronic pain syndrome [G89.4]   . Urinary, incontinence, stress female [N39.3] 10/02/2015  . MDD (major depressive disorder), recurrent severe, without psychosis ([place]) [F33.2] 04/05/2014  . Suicide attempt ([place]) [T14.91XA]   . Hip pain, left [M25.552] 08/14/2011  . PAP SMER W/ATYPCL SQAUMOUS CELL NOT EXCLD HI GRD [R87.611] 04/09/2010  . HEPATITIS B [B19.10] 01/31/2008  . Drug abuse in remission [Z87.898] 04/04/2007  . OBESITY, NOS [E66.9] 07/29/2006  . TOBACCO DEPENDENCE [F17.200] 07/29/2006  . Bipolar I disorder, most recent episode mixed ([place]) [F31.60] 07/29/2006  . Chronic bilateral low back pain without sciatica [M54.5, G89.29] 07/29/2006    Total Time spent with patient: 1 hour  Subjective:   Kylie [name] is a 46 y.o. female patient admitted with AMS.  HPI: [name] is a 46 yo Female with Hx of bipolar disorder and chronic back pain on methadone admitted to [facility] ICU on 8/17 as she was presented with unresponsive, and minimal response to narcan. She required intubated for airway  protection on arrival and extubated today. Patient stated that she has met a female friend at Methadone clinic and since than she has been relapsed on her drug of abuse and she has unintentionally overdosed and fell in her back car seat and than she found being in hospital. Patient is awake, alert, oriented x 3. She feels regrets for her relapse on drug of abuse and being friendly with the female friend and non compliant with her bipolar medications. She is willing to restart her bipolar medication and going back to out patient psychiatric services. She denied current suicide or homicide ideation, intention or plans. She has no evidence of psychosis. Patient urin drug screen is positive of cocaine, opioids and benzo's.   Past Psychiatric History: Bipolar disorder and polysubstance abuse. Her last admission to [facility] was April 06, 2014.  Risk to Self:   Risk to Others:   Prior Inpatient Therapy:   Prior Outpatient Therapy:    Past Medical History:  Past Medical History:  Diagnosis Date  . Anxiety   . Bipolar 1 disorder ([place])   . Cervical cancer ([place])    Stage 2  . Chronic UTI   . Degenerative disk disease   . Diabetes mellitus without complication ([place])   . Hepatitis B infection   . Ovarian cyst   . Panic attack     Past Surgical History:  Procedure Laterality Date  . CERVIX SURGERY    . MANDIBLE FRACTURE SURGERY    . TUBAL LIGATION    . VIDEO ASSISTED THORACOSCOPY (VATS)/EMPYEMA Right 11/26/2015   Procedure: VIDEO ASSISTED THORACOSCOPY (VATS)/EMPYEMA;  Surgeon: Ivin Poot, MD;  Location: [place];  Service: Thoracic;  Laterality: Right;  Marland Kitchen VIDEO BRONCHOSCOPY N/A 11/26/2015   Procedure: VIDEO BRONCHOSCOPY;  Surgeon: Ivin Poot, MD;  Location: [facility] OR;  Service: Thoracic;  Laterality: N/A;  . WISDOM TOOTH EXTRACTION     Family History: History reviewed. No pertinent family history. Family Psychiatric  History: unknown Social History:  History  Alcohol Use No     History  Drug  Use No    Comment: previous use- quit 10/10    Social History   Social History  . Marital status: Single    Spouse name: N/A  . Number of children: N/A  . Years of education: N/A   Social History Main Topics  . Smoking status: Former Smoker    Packs/day: 1.00    Years: 25.00    Types: Cigarettes  . Smokeless tobacco: Never Used  . Alcohol use No  . Drug use: No     Comment: previous use- quit 10/10  . Sexual activity: Yes    Partners: Male    Birth control/ protection: Other-see comments, Surgical     Comment: one partner: SP BTL   Other Topics Concern  . None   Social History Narrative  . None   Additional Social History:    Allergies:   Allergies  Allergen Reactions  . Pregabalin Anaphylaxis  . Hydrocodone Itching and Rash  . Citalopram Other (See Comments)    Worsened anxiety  . Ibuprofen Other (See Comments)    Stomach pain  . Naproxen Swelling and Other (See Comments)    troat and fingers swelling   . Povidone-Iodine Itching and Rash    Labs:  Results for orders placed or performed during the hospital encounter of 01/15/17 (from the past 48 hour(s))  Glucose, capillary     Status: Abnormal   Collection Time: 01/16/17 12:27 PM  Result Value Ref Range   Glucose-Capillary 111 (H) 65 - 99 mg/dL   Comment 1 Capillary Specimen    Comment 2 Notify RN   Magnesium     Status: None   Collection Time: 01/16/17  4:10 PM  Result Value Ref Range   Magnesium 2.4 1.7 - 2.4 mg/dL  Phosphorus     Status: Abnormal   Collection Time: 01/16/17  4:10 PM  Result Value Ref Range   Phosphorus 1.7 (L) 2.5 - 4.6 mg/dL  Glucose, capillary     Status: Abnormal   Collection Time: 01/16/17  4:41 PM  Result Value Ref Range   Glucose-Capillary 107 (H) 65 - 99 mg/dL   Comment 1 Capillary Specimen    Comment 2 Notify RN   Glucose, capillary     Status: Abnormal   Collection Time: 01/16/17  8:43 PM  Result Value Ref Range   Glucose-Capillary 103 (H) 65 - 99 mg/dL   Comment  1 Notify RN   Glucose, capillary     Status: Abnormal   Collection Time: 01/16/17 11:30 PM  Result Value Ref Range   Glucose-Capillary 115 (H) 65 - 99 mg/dL   Comment 1 Capillary Specimen   I-STAT 3, arterial blood gas (G3+)     Status: None   Collection Time: 01/17/17  2:52 AM  Result Value Ref Range   pH, Arterial 7.439 7.350 - 7.450   pCO2 arterial 37.7 32.0 - 48.0 mmHg   pO2, Arterial 87.0 83.0 - 108.0 mmHg   Bicarbonate 25.5 20.0 - 28.0 mmol/L   TCO2 27 0 - 100 mmol/L   O2 Saturation 97.0 %   Acid-Base Excess 1.0 0.0 -  2.0 mmol/L   Patient temperature 98.5 F    Collection site RADIAL, ALLEN'S TEST ACCEPTABLE    Drawn by RT    Sample type ARTERIAL   Glucose, capillary     Status: Abnormal   Collection Time: 01/17/17  4:09 AM  Result Value Ref Range   Glucose-Capillary 114 (H) 65 - 99 mg/dL   Comment 1 Capillary Specimen   Basic metabolic panel     Status: Abnormal   Collection Time: 01/17/17  4:50 AM  Result Value Ref Range   Sodium 147 (H) 135 - 145 mmol/L   Potassium 3.5 3.5 - 5.1 mmol/L    Comment: DELTA CHECK NOTED   Chloride 118 (H) 101 - 111 mmol/L   CO2 26 22 - 32 mmol/L   Glucose, Bld 109 (H) 65 - 99 mg/dL   BUN 14 6 - 20 mg/dL   Creatinine, Ser 0.80 0.44 - 1.00 mg/dL   Calcium 7.8 (L) 8.9 - 10.3 mg/dL   GFR calc non Af Amer >60 >60 mL/min   GFR calc Af Amer >60 >60 mL/min    Comment: (NOTE) The eGFR has been calculated using the CKD EPI equation. This calculation has not been validated in all clinical situations. eGFR's persistently <60 mL/min signify possible Chronic Kidney Disease.    Anion gap 3 (L) 5 - 15  CBC with Differential/Platelet     Status: Abnormal   Collection Time: 01/17/17  4:50 AM  Result Value Ref Range   WBC 5.9 4.0 - 10.5 K/uL   RBC 3.78 (L) 3.87 - 5.11 MIL/uL   Hemoglobin 10.4 (L) 12.0 - 15.0 g/dL   HCT 33.7 (L) 36.0 - 46.0 %   MCV 89.2 78.0 - 100.0 fL   MCH 27.5 26.0 - 34.0 pg   MCHC 30.9 30.0 - 36.0 g/dL   RDW 16.5 (H) 11.5 -  15.5 %   Platelets 188 150 - 400 K/uL   Neutrophils Relative % 64 %   Neutro Abs 3.8 1.7 - 7.7 K/uL   Lymphocytes Relative 29 %   Lymphs Abs 1.7 0.7 - 4.0 K/uL   Monocytes Relative 6 %   Monocytes Absolute 0.4 0.1 - 1.0 K/uL   Eosinophils Relative 1 %   Eosinophils Absolute 0.0 0.0 - 0.7 K/uL   Basophils Relative 0 %   Basophils Absolute 0.0 0.0 - 0.1 K/uL  Magnesium     Status: None   Collection Time: 01/17/17  4:50 AM  Result Value Ref Range   Magnesium 2.4 1.7 - 2.4 mg/dL  Phosphorus     Status: Abnormal   Collection Time: 01/17/17  4:50 AM  Result Value Ref Range   Phosphorus 1.4 (L) 2.5 - 4.6 mg/dL  Glucose, capillary     Status: None   Collection Time: 01/17/17  9:19 AM  Result Value Ref Range   Glucose-Capillary 83 65 - 99 mg/dL   Comment 1 Capillary Specimen    Comment 2 Notify RN   Lithium level     Status: Abnormal   Collection Time: 01/17/17 11:07 AM  Result Value Ref Range   Lithium Lvl 0.21 (L) 0.60 - 1.20 mmol/L  Glucose, capillary     Status: Abnormal   Collection Time: 01/17/17 12:51 PM  Result Value Ref Range   Glucose-Capillary 107 (H) 65 - 99 mg/dL   Comment 1 Capillary Specimen    Comment 2 Notify RN   Glucose, capillary     Status: Abnormal   Collection Time: 01/17/17  4:03 PM  Result Value Ref Range   Glucose-Capillary 120 (H) 65 - 99 mg/dL   Comment 1 Notify RN   Magnesium     Status: None   Collection Time: 01/17/17  5:27 PM  Result Value Ref Range   Magnesium 2.4 1.7 - 2.4 mg/dL  Phosphorus     Status: None   Collection Time: 01/17/17  5:27 PM  Result Value Ref Range   Phosphorus 3.4 2.5 - 4.6 mg/dL  Glucose, capillary     Status: Abnormal   Collection Time: 01/17/17  9:21 PM  Result Value Ref Range   Glucose-Capillary 103 (H) 65 - 99 mg/dL   Comment 1 Notify RN   Basic metabolic panel     Status: Abnormal   Collection Time: 01/17/17 10:19 PM  Result Value Ref Range   Sodium 142 135 - 145 mmol/L   Potassium 3.6 3.5 - 5.1 mmol/L    Chloride 111 101 - 111 mmol/L   CO2 27 22 - 32 mmol/L   Glucose, Bld 82 65 - 99 mg/dL   BUN 10 6 - 20 mg/dL   Creatinine, Ser 0.80 0.44 - 1.00 mg/dL   Calcium 7.8 (L) 8.9 - 10.3 mg/dL   GFR calc non Af Amer >60 >60 mL/min   GFR calc Af Amer >60 >60 mL/min    Comment: (NOTE) The eGFR has been calculated using the CKD EPI equation. This calculation has not been validated in all clinical situations. eGFR's persistently <60 mL/min signify possible Chronic Kidney Disease.    Anion gap 4 (L) 5 - 15  Glucose, capillary     Status: Abnormal   Collection Time: 01/18/17 12:30 AM  Result Value Ref Range   Glucose-Capillary 101 (H) 65 - 99 mg/dL   Comment 1 Notify RN   Basic metabolic panel     Status: Abnormal   Collection Time: 01/18/17  4:23 AM  Result Value Ref Range   Sodium 142 135 - 145 mmol/L   Potassium 3.5 3.5 - 5.1 mmol/L   Chloride 110 101 - 111 mmol/L   CO2 28 22 - 32 mmol/L   Glucose, Bld 77 65 - 99 mg/dL   BUN 8 6 - 20 mg/dL   Creatinine, Ser 0.72 0.44 - 1.00 mg/dL   Calcium 7.7 (L) 8.9 - 10.3 mg/dL   GFR calc non Af Amer >60 >60 mL/min   GFR calc Af Amer >60 >60 mL/min    Comment: (NOTE) The eGFR has been calculated using the CKD EPI equation. This calculation has not been validated in all clinical situations. eGFR's persistently <60 mL/min signify possible Chronic Kidney Disease.    Anion gap 4 (L) 5 - 15  CBC with Differential/Platelet     Status: Abnormal   Collection Time: 01/18/17  4:23 AM  Result Value Ref Range   WBC 6.7 4.0 - 10.5 K/uL   RBC 3.55 (L) 3.87 - 5.11 MIL/uL   Hemoglobin 10.0 (L) 12.0 - 15.0 g/dL   HCT 32.2 (L) 36.0 - 46.0 %   MCV 90.7 78.0 - 100.0 fL   MCH 28.2 26.0 - 34.0 pg   MCHC 31.1 30.0 - 36.0 g/dL   RDW 16.5 (H) 11.5 - 15.5 %   Platelets 187 150 - 400 K/uL   Neutrophils Relative % 57 %   Neutro Abs 3.9 1.7 - 7.7 K/uL   Lymphocytes Relative 35 %   Lymphs Abs 2.3 0.7 - 4.0 K/uL   Monocytes Relative 6 %  Monocytes Absolute 0.4 0.1  - 1.0 K/uL   Eosinophils Relative 2 %   Eosinophils Absolute 0.1 0.0 - 0.7 K/uL   Basophils Relative 0 %   Basophils Absolute 0.0 0.0 - 0.1 K/uL  Glucose, capillary     Status: None   Collection Time: 01/18/17  4:50 AM  Result Value Ref Range   Glucose-Capillary 77 65 - 99 mg/dL  Glucose, capillary     Status: None   Collection Time: 01/18/17  8:33 AM  Result Value Ref Range   Glucose-Capillary 80 65 - 99 mg/dL   Comment 1 Document in Chart   Glucose, capillary     Status: Abnormal   Collection Time: 01/18/17 12:12 PM  Result Value Ref Range   Glucose-Capillary 107 (H) 65 - 99 mg/dL   Comment 1 Capillary Specimen     Current Facility-Administered Medications  Medication Dose Route Frequency Provider Last Rate Last Dose  . amoxicillin-clavulanate (AUGMENTIN) 875-125 MG per tablet 1 tablet  1 tablet Oral Q12H Rush Farmer, MD      . chlorhexidine gluconate (MEDLINE KIT) (PERIDEX) 0.12 % solution 15 mL  15 mL Mouth Rinse BID Judeth Porch, MD   15 mL at 01/18/17 3151  . dextrose 5 % solution   Intravenous Continuous Marijean Heath, NP 10 mL/hr at 01/18/17 0917    . enoxaparin (LOVENOX) injection 40 mg  40 mg Subcutaneous Daily Judeth Porch, MD   40 mg at 01/18/17 0939  . famotidine (PEPCID) IVPB 20 mg premix  20 mg Intravenous Q12H Harvel Quale, [facility]   Stopped at 01/18/17 1010  . lactulose (CHRONULAC) 10 GM/15ML solution 20 g  20 g Oral BID Whiteheart, Kathryn A, NP      . levothyroxine (SYNTHROID, LEVOTHROID) tablet 50 mcg  50 mcg Oral QAC breakfast Judeth Porch, MD   50 mcg at 01/18/17 0831  . lithium carbonate capsule 600 mg  600 mg Oral BID Raylene Miyamoto, MD   600 mg at 01/18/17 0940  . MEDLINE mouth rinse  15 mL Mouth Rinse QID Judeth Porch, MD   15 mL at 01/18/17 1207  . methadone (DOLOPHINE) tablet 10 mg  10 mg Per Tube BID Raylene Miyamoto, MD   10 mg at 01/18/17 0940    Musculoskeletal: Strength & Muscle Tone: decreased Gait & Station:  unable to stand Patient leans: N/A  Psychiatric Specialty Exam: Physical Exam as per history and physical  ROS chronic pain syndrome, relapse on multiple drugs and non compliant with psych medication. She has mild nausea, but denied vomiting, sweating and shaking. No Chest pain and SOB. No Fever-chills, No Headache, No changes with Vision or hearing, reports vertigo No problems swallowing food or Liquids, No Chest pain, Cough or Shortness of Breath, No Abdominal pain, No Nausea or Vommitting, Bowel movements are regular, No Blood in stool or Urine, No dysuria, No new skin rashes or bruises, No new joints pains-aches,  No new weakness, tingling, numbness in any extremity, No recent weight gain or loss, No polyuria, polydypsia or polyphagia,  A full 10 point Review of Systems was done, except as stated above, all other Review of Systems were negative.  Blood pressure (!) 104/56, pulse 61, temperature 98.1 F (36.7 C), temperature source Oral, resp. rate (!) 9, height _0  (1.626 m), weight 112.3 kg (247 lb 9.2 oz), SpO2 97 %.Body mass index is 42.5 kg/m.  General Appearance: Disheveled and Guarded  Eye Contact:  Good  Speech:  Slow  Volume:  Decreased  Mood:  Depressed  Affect:  Constricted and Depressed  Thought Process:  Coherent and Goal Directed  Orientation:  Full (Time, Place, and Person)  Thought Content:  Rumination  Suicidal Thoughts:  No  Homicidal Thoughts:  No  Memory:  Immediate;   Good Recent;   Fair Remote;   Fair  Judgement:  Impaired  Insight:  Fair  Psychomotor Activity:  Decreased  Concentration:  Concentration: Fair and Attention Span: Fair  Recall:  [employer] of Knowledge:  Good  Language:  Good  Akathisia:  Negative  Handed:  Right  AIMS (if indicated):     Assets:  Communication Skills Desire for Improvement Financial Resources/Insurance Housing Leisure Time Resilience Social Support  ADL's:  Impaired  Cognition:  WNL  Sleep:         Treatment Plan Summary: Patient with long history of bipolar disorder and polysubstance abuse presented with relapse of multiple drugs over 2 months and denied suicide or homicide ideation, intention or plans.  Patient Altered mental status has been resolved with her current medication treatment May restart her seroquel XR 300 mg when medically stable and monitor for Qtc prolongation. As per 01/16/2017 - QT/QTc 464/486 ms Appreciate psychiatric consultation and we sign off as of today Please contact [phone] or [phone] if needs further assistance   Disposition: Patient will be referred to out patient medication management and Methadone clinic when medically stable - she prefer to go back to [facility] family medicine for psych medication and refuses need of psych out patient and IOP. Patient does not meet criteria for psychiatric inpatient admission. Supportive therapy provided about ongoing stressors.  Ambrose Finland, MD 01/18/2017 12:15 PM

## 2017-01-18 NOTE — Progress Notes (Signed)
PULMONARY / CRITICAL CARE MEDICINE   Name: Kylie [name] MRN: [id number] DOB: 1970/11/29    ADMISSION DATE:  01/15/2017 CONSULTATION DATE:  8/18  REFERRING MD:  EDP   HISTORY OF PRESENT ILLNESS:   46 yo F with Hx of bipolar disorder and chronic back pain on methadone presented 8/17 unresponsive, minimal response to narcan. Intubated for airway protection. Developed hypotension afterwards, possible aspiration.    SUBJECTIVE:  Extubated yesterday.  Stable overnight.   VITAL SIGNS: BP 102/70   Pulse 63   Temp 97.8 F (36.6 C) (Oral)   Resp 10   Ht 5\' 4"  (1.626 m)   Wt 112.3 kg (247 lb 9.2 oz)   SpO2 98%   BMI 42.50 kg/m   HEMODYNAMICS: CVP:  [13 mmHg] 13 mmHg  VENTILATOR SETTINGS: Vent Mode: PSV;CPAP FiO2 (%):  [40 %] 40 % PEEP:  [5 cmH20] 5 cmH20 Pressure Support:  [5 cmH20] 5 cmH20  INTAKE / OUTPUT: I/O last 3 completed shifts: In: 4141.7 [I.V.:2156.7; NG/GT:825; IV Piggyback:1160] Out: 2230 [Urine:2230]  PHYSICAL EXAMINATION: General:  Obese female, NAD  Neuro:  Flat affect, MAE, follows commands, oriented  HEENT:  Mm dry, no JVD  Cardiovascular:  s1s2 rrr Lungs:  resps even non labored, strong cough, coarse throughout Abdomen:  Round, soft, +bs  Musculoskeletal:  Warm and dry, no edema   LABS:  BMET  Recent Labs Lab 01/17/17 0450 01/17/17 2219 01/18/17 0423  NA 147* 142 142  K 3.5 3.6 3.5  CL 118* 111 110  CO2 26 27 28   BUN 14 10 8   CREATININE 0.80 0.80 0.72  GLUCOSE 109* 82 77    Electrolytes  Recent Labs Lab 01/16/17 1610 01/17/17 0450 01/17/17 1727 01/17/17 2219 01/18/17 0423  CALCIUM  --  7.8*  --  7.8* 7.7*  MG 2.4 2.4 2.4  --   --   PHOS 1.7* 1.4* 3.4  --   --     CBC  Recent Labs Lab 01/16/17 0334 01/17/17 0450 01/18/17 0423  WBC 9.6 5.9 6.7  HGB 11.0* 10.4* 10.0*  HCT 35.2* 33.7* 32.2*  PLT 212 188 187    Coag's No results for input(s): APTT, INR in the last 168 hours.  Sepsis Markers  Recent Labs Lab  01/15/17 2312  LATICACIDVEN 1.79    ABG  Recent Labs Lab 01/15/17 2327 01/16/17 1125 01/17/17 0252  PHART 7.206* 7.416 7.439  PCO2ART 63.6* 38.2 37.7  PO2ART 108.0 110.0* 87.0    Liver Enzymes  Recent Labs Lab 01/15/17 2315  AST 22  ALT 17  ALKPHOS 84  BILITOT 0.5  ALBUMIN 3.3*    Cardiac Enzymes No results for input(s): TROPONINI, PROBNP in the last 168 hours.  Glucose  Recent Labs Lab 01/17/17 1251 01/17/17 1603 01/17/17 2121 01/18/17 0030 01/18/17 0450 01/18/17 0833  GLUCAP 107* 120* 103* 101* 77 80    Imaging Dg Chest Port 1 View  Result Date: 01/18/2017 CLINICAL DATA:  Respiratory acidosis. EXAM: PORTABLE CHEST 1 VIEW COMPARISON:  11/17/2016 . FINDINGS: Interim extubation and removal of NG tube. Right subclavian line tip noted at the cavoatrial junction. Cardiomegaly with mild bilateral interstitial prominence. A component of mild CHF cannot be excluded. Subsegmental atelectatic changes noted in both mid lung fields. Tiny bilateral pleural effusions cannot be excluded. No pneumothorax . IMPRESSION: 1. Interim extubation and removal of NG tube. Right subclavian line tip noted at the cavoatrial junction. 2. Cardiomegaly with mild increase interstitial prominence suggesting mild CHF. Tiny bilateral pleural effusions  cannot be excluded. 3.  Bilateral mid lung field subsegmental atelectasis . Electronically Signed   By: Marcello Moores  Register   On: 01/18/2017 06:20     STUDIES:  CT head 8/18>>> neg acute  CTA chest 8/18>>> 1. Limited evaluation for distal segmental and subsegmental emboli due to extensive consolidations and suboptimal opacification of distal branch vessels. No acute central PE is seen. 2. Multifocal consolidations and ground-glass densities within the upper and lower lobes, concerning for multifocal pneumonia, cannot exclude aspiration at the lower lobes. 3. Trace pleural effusions.  CULTURES: BC x 2 8/18>>>GPR>>> Sputum 8/18>>> GPR,  GNR>>> Urine 8/18>>> recollect>>>  ANTIBIOTICS: Unasyn 8/18>>>  SIGNIFICANT EVENTS:   LINES/TUBES: ETT 8/18>>>8/19  DISCUSSION: 46yo female with hx bipolar admitted 8/18 with AMS requiring intubation, no response to narcan, possible aspiration.   ASSESSMENT / PLAN:  PULMONARY Acute hypoxic respiratory failure r/t AMS Aspiration PNA  P:   Pulmonary hygiene  Continue unasyn  Mobilize  F/u CXR  Follow cultures  CARDIOVASCULAR Septic shock - resolved  P:  Off pressors  KVO IVF    RENAL Hypernatremia - improved  hypophos  Hyperchloremia  P:   F/u chem  KVO D5   GASTROINTESTINAL No active issue  P:   Add PO diet   HEMATOLOGIC Anemia - mild  P:  lovenox  F/u CBC   INFECTIOUS Aspiration PNA  sepsis P:   Follow culture data  Recollect urine  Continue unasyn  If remains culture neg continue unasyn->augmentin for total 5-7 days    ENDOCRINE hypothyroid P:   Continue home synthroid   NEUROLOGIC AMS - unclear etiology.  ?OD.   Hyperammonemia  P:   Psych to see  Sitter at bedside  Continue lithium, methadone  Continue to hold home seroquel, neurontin for now Resume home lactulose   FAMILY  - Updates:  Pt updated, no family available 8/20   tx to floor, will ask [facility] to assume care in am 8/21    Nickolas Madrid, NP 01/18/2017  9:18 AM Pager: [phone] or [phone]  Attending Note:  46 year female with bipolar disorder presenting with a narcotic overdose that required intubation.  Extubated yesterday.  On exam, lungs are clear.  I reviewed CXR myself, no acute disease noted.  Discussed with TR[facility]-MD and PCCM-NP.  Drug overdose:  - Hold of narcotics for now.  - Psych called  Acute respiratory failure:  - Titrate o2 for sat of 88-92%  Aspiration PNA  - Unasyn  - F/U on cultures  Bipolar:  - Lithium  - Methadone, 10 mg BID PO  Transfer out of the ICU and to [facility] service with PCCM off 8/21  Patient seen and examined,  agree with above note.  I dictated the care and orders written for this patient under my direction.  Rush Farmer, [place]

## 2017-01-19 ENCOUNTER — Inpatient Hospital Stay (HOSPITAL_COMMUNITY): Payer: Medicaid Other

## 2017-01-19 DIAGNOSIS — G8929 Other chronic pain: Secondary | ICD-10-CM

## 2017-01-19 DIAGNOSIS — M545 Low back pain: Secondary | ICD-10-CM

## 2017-01-19 DIAGNOSIS — J189 Pneumonia, unspecified organism: Secondary | ICD-10-CM

## 2017-01-19 DIAGNOSIS — F3160 Bipolar disorder, current episode mixed, unspecified: Secondary | ICD-10-CM

## 2017-01-19 LAB — URINE CULTURE: CULTURE: NO GROWTH

## 2017-01-19 LAB — CBC
HCT: 35.3 % — ABNORMAL LOW (ref 36.0–46.0)
Hemoglobin: 11.5 g/dL — ABNORMAL LOW (ref 12.0–15.0)
MCH: 28.4 pg (ref 26.0–34.0)
MCHC: 32.6 g/dL (ref 30.0–36.0)
MCV: 87.2 fL (ref 78.0–100.0)
PLATELETS: 214 10*3/uL (ref 150–400)
RBC: 4.05 MIL/uL (ref 3.87–5.11)
RDW: 15.2 % (ref 11.5–15.5)
WBC: 7.9 10*3/uL (ref 4.0–10.5)

## 2017-01-19 LAB — BASIC METABOLIC PANEL
Anion gap: 6 (ref 5–15)
BUN: 8 mg/dL (ref 6–20)
CALCIUM: 8.2 mg/dL — AB (ref 8.9–10.3)
CO2: 24 mmol/L (ref 22–32)
CREATININE: 0.75 mg/dL (ref 0.44–1.00)
Chloride: 109 mmol/L (ref 101–111)
Glucose, Bld: 78 mg/dL (ref 65–99)
Potassium: 3.8 mmol/L (ref 3.5–5.1)
SODIUM: 139 mmol/L (ref 135–145)

## 2017-01-19 LAB — GLUCOSE, CAPILLARY
GLUCOSE-CAPILLARY: 86 mg/dL (ref 65–99)
Glucose-Capillary: 97 mg/dL (ref 65–99)

## 2017-01-19 MED ORDER — LEVOFLOXACIN 750 MG PO TABS
750.0000 mg | ORAL_TABLET | Freq: Every day | ORAL | Status: DC
  Administered 2017-01-19: 750 mg via ORAL
  Filled 2017-01-19: qty 1

## 2017-01-19 MED ORDER — NICOTINE 21 MG/24HR TD PT24: 21.0000 mg | MEDICATED_PATCH | Freq: Every day | TRANSDERMAL | Status: DC | PRN

## 2017-01-19 MED ORDER — LITHIUM CARBONATE 300 MG PO CAPS
300.0000 mg | ORAL_CAPSULE | Freq: Two times a day (BID) | ORAL | Status: DC
  Administered 2017-01-19 – 2017-01-20 (×2): 300 mg via ORAL
  Filled 2017-01-19 (×3): qty 1

## 2017-01-19 MED ORDER — ACETAMINOPHEN 325 MG PO TABS: 650.0000 mg | ORAL_TABLET | Freq: Four times a day (QID) | ORAL | Status: DC | PRN

## 2017-01-19 MED ORDER — LEVOFLOXACIN 500 MG PO TABS
500.0000 mg | ORAL_TABLET | Freq: Every day | ORAL | Status: DC
  Administered 2017-01-20: 500 mg via ORAL
  Filled 2017-01-19: qty 1

## 2017-01-19 MED ORDER — QUETIAPINE FUMARATE ER 300 MG PO TB24: 300.0000 mg | ORAL_TABLET | Freq: Every day | ORAL | Status: DC

## 2017-01-19 MED ORDER — QUETIAPINE FUMARATE ER 300 MG PO TB24
300.0000 mg | ORAL_TABLET | Freq: Every day | ORAL | Status: DC
  Administered 2017-01-19: 300 mg via ORAL
  Filled 2017-01-19 (×2): qty 1

## 2017-01-19 NOTE — Progress Notes (Signed)
Family Medicine Teaching Service Daily Progress Note Intern Pager: [phone]  Patient name: Kylie [name] Medical record number: [id number] Date of birth: 04/22/1971 Age: 46 y.o. Gender: female  Primary Care Provider: Zenia Resides, MD Consultants: psych Code Status: full  Pt Overview and Major Events to Date:  46 yo F with Hx of bipolar disorder and chronic back pain on methadone presented to EMS 8/17 unresponsive with minimal response to narcan. Intubated for airway protection. Developed hypotension afterwards with possible aspiration pneumonia.   Extubated 8/20 and transferred to med-surg.  Assessment and Plan: 46 yo F with Hx of bipolar disorder and chronic back pain on methadone presented to EMS 8/17 unresponsive with minimal response to narcan.  Suspected drug overdose- + for cocaine, opiate, benzo.stabilized and now extubated.  Patient says she does not remember using drugs "I thought I was done with that".   -assuming care from [facility] team today, we appreciate their assistance  -methadone 10mg  -consult SW for rehab resources -will check CIWAs for at least 24hours  Suicidal risk: history of suicide attempt and loss of multiple family members to suicide -sitter with patient -psych to follow -patient denies ideation of selfharm or to harm others  Suspected aspiration pneumonia/vsCOOP/vsards-cultures negative to date -augmentin (day 2 of 5) per pharm -levoquin (day 1of7) -CXR ordered  Bipolar -lithium (reduced to 300 BID due to concern of prior noncompliance) -lithium level check on 8/25 -ecg -will consider home seroquel after ecg  Hx of suicide attempt -will discuss SI/HI with patient -psych following  Hypothyroidism -home synthroid  FEN/GI: famotidine, lactulose PPx: lovenox  Disposition: home vs rehab vs psych upon medical stabilization  Subjective:  Patient was pleasant upon interview but very confused about the details of how she got here.   She remembers  driving with her friend and not feeling well but does not recal using drugs that day and does not remember how she got to the hospital.   We discussed the story as I knew and she said she ahd thought she quit drugs.   She was alert and oriented and remember Dr. Andria Frames, she denied SI/HI and said her only pain was her chronic hip/lower back pain which she thought was tolerable currently.  She had no immediate complaints or requests.  Objective: Temp:  [97.9 F (36.6 C)-98.2 F (36.8 C)] 98.1 F (36.7 C) (08/21 0626) Pulse Rate:  [59-67] 67 (08/21 0626) Resp:  [9-23] 18 (08/21 0626) BP: (96-130)/(43-79) 130/79 (08/21 0626) SpO2:  [90 %-99 %] 98 % (08/21 0626) Weight:  [243 lb 3.2 oz (110.3 kg)] 243 lb 3.2 oz (110.3 kg) (08/21 4235) Physical Exam: General: NAD, alert and conversational, obese Cardiovascular: 2/6 murmurs but difficult to auscultate due to louder breath sounds, heart rate seemed RRR Respiratory: Lung sounds CTA, no wheezing, no IWB  Abdomen: soft with no TTP, bowel sounds throughout Extremities: able to sit unaided, no deficits noted  Laboratory:  Recent Labs Lab 01/17/17 0450 01/18/17 0423 01/19/17 0733  WBC 5.9 6.7 7.9  HGB 10.4* 10.0* 11.5*  HCT 33.7* 32.2* 35.3*  PLT 188 187 214    Recent Labs Lab 01/15/17 2315  01/17/17 2219 01/18/17 0423 01/19/17 0609  NA 139  < > 142 142 139  K 4.2  < > 3.6 3.5 3.8  CL 107  < > 111 110 109  CO2 23  < > 27 28 24   BUN 14  < > 10 8 8   CREATININE 1.03*  < > 0.80 0.72 0.75  CALCIUM 7.6*  < > 7.8* 7.7* 8.2*  PROT 6.1*  --   --   --   --   BILITOT 0.5  --   --   --   --   ALKPHOS 84  --   --   --   --   ALT 17  --   --   --   --   AST 22  --   --   --   --   GLUCOSE 258*  < > 82 77 78  < > = values in this interval not displayed.    Imaging/Diagnostic Tests: Ct Head Wo Contrast  Result Date: 01/16/2017 IMPRESSION: No acute intracranial abnormality. Electronically Signed   By: Jeb Levering M.D.   On:  01/16/2017 00:37   Ct Angio Chest Pe W And/or Wo Contrast  Result Date: 01/16/2017 IMPRESSION: 1. Limited evaluation for distal segmental and subsegmental emboli due to extensive consolidations and suboptimal opacification of distal branch vessels. No acute central PE is seen. 2. Multifocal consolidations and ground-glass densities within the upper and lower lobes, concerning for multifocal pneumonia, cannot exclude aspiration at the lower lobes. 3. Trace pleural effusions. Electronically Signed   By: Donavan Foil M.D.   On: 01/16/2017 00:55   Dg Chest Port 1 View  Result Date: 01/16/2017  IMPRESSION: 1. Right-sided central venous catheter tip overlies the low right atrium. Negative for pneumothorax 2. Low lung volumes with continued moderate bilateral pulmonary infiltrates 3. Cardiomegaly Electronically Signed   By: Donavan Foil M.D.   On: 01/16/2017 02:26   Dg Chest Portable 1 View  Result Date: 01/15/2017  IMPRESSION: 1. Endotracheal tube tip about 2.6 cm superior to the carina 2. Left upper lobe and left lung base airspace opacities concerning for pneumonia 3. Linear scarring at the right base 4. Cardiomegaly Electronically Signed   By: Donavan Foil M.D.   On: 01/15/2017 23:40     Sherene Sires, DO 01/19/2017, 9:24 AM PGY-1, [place] Intern pager: [phone], text pages welcome

## 2017-01-19 NOTE — Discharge Summary (Signed)
[facility] Discharge Summary  Patient name: Kylie [name] Medical record number: [id number] Date of birth: 07/23/1970 Age: 46 y.o. Gender: female Date of Admission: 01/15/2017  Date of Discharge: 01/20/17 Admitting Physician: Raylene Miyamoto, MD  Primary Care Provider: Zenia Resides, MD Consultants: CCM, Psych  Indication for Hospitalization: suspected drug overdose  Discharge Diagnoses/Problem List:  Suspected drug overdose Bipolar Aspiration pneumonia hypothyroidism  Disposition: to home  Discharge Condition: stable  Discharge Exam: General: NAD, alert and conversational, obese Cardiovascular: 2/6 murmurs but difficult to auscultate due to louder breath sounds, heart rate seemed RRR Respiratory: Lung sounds CTA, no wheezing, no IWB  Abdomen: soft with no TTP, bowel sounds throughout Extremities: able to sit unaided, no deficits noted  Brief Hospital Course:  46 yo F with Hx of bipolar disorder and chronic back pain on methadone presented to EMS 8/17 unresponsive with minimal response to narcan. Intubated for airway protection. Developed hypotension afterwards with possible aspiration pneumonia.   Extubated 8/20 and transferred to med-surg with improving CXR.  Upon interview on the med-surg floor she was evaluated by psych for prior suicide attempts and concern that this might have been an intentional overdose.   She denied suicidal ideation consiistently and was cleared by psych for outpatient followup.  Given her medical stability and psych clearance she was discharged.  She has transportation concerns and for that reason she refused to allow [place] to schedule her followups for her.  Issues for Follow Up:  1. Patient would benefit from coordination with substance abuse treatment resources 2. Patient needs mental health resources for bipolar with multiple suicide attemps 3. Lithium level needs checked ~8/25, dose was reduced to 300BID from  prescribed 600BID due to concerns of noncompliance 4. Patient refused to have an appt scheduled for her with the [facility] because of transportation that she wanted to arrange first, so please help facilitate her followup with [facility] and the methadone clinic if possible. 5. She had a serious pulmonary infection and was partially through a course of augmenting and levoquin on D/C.  Please inquire as to her adherence with these treatment and her respiratory status.  Significant Procedures: intubation with concern for aspiration pneumonia secondary to overdose  Significant Labs and Imaging:   Recent Labs Lab 01/18/17 0423 01/19/17 0733 01/20/17 0427  WBC 6.7 7.9 8.4  HGB 10.0* 11.5* 11.3*  HCT 32.2* 35.3* 35.5*  PLT 187 214 227    Recent Labs Lab 01/15/17 2315  01/16/17 0334 01/16/17 1040 01/16/17 1610 01/17/17 0450 01/17/17 1727 01/17/17 2219 01/18/17 0423 01/19/17 0609 01/20/17 0427  NA 139  < > 141  --   --  147*  --  142 142 139 140  K 4.2  < > 4.4  --   --  3.5  --  3.6 3.5 3.8 3.3*  CL 107  < > 110  --   --  118*  --  111 110 109 108  CO2 23  --  28  --   --  26  --  27 28 24 25   GLUCOSE 258*  < > 86  --   --  109*  --  82 77 78 95  BUN 14  < > 11  --   --  14  --  10 8 8  5*  CREATININE 1.03*  < > 0.81  --   --  0.80  --  0.80 0.72 0.75 0.72  CALCIUM 7.6*  --  7.4*  --   --  7.8*  --  7.8* 7.7* 8.2* 8.4*  MG  --   --  2.3 2.3 2.4 2.4 2.4  --   --   --   --   PHOS  --   --  3.3 1.8* 1.7* 1.4* 3.4  --   --   --   --   ALKPHOS 84  --   --   --   --   --   --   --   --   --   --   AST 22  --   --   --   --   --   --   --   --   --   --   ALT 17  --   --   --   --   --   --   --   --   --   --   ALBUMIN 3.3*  --   --   --   --   --   --   --   --   --   --   < > = values in this interval not displayed.  Ct Head Wo Contrast  Result Date: 01/16/2017 CLINICAL DATA:  Altered level of consciousness. EXAM: CT HEAD WITHOUT CONTRAST TECHNIQUE: Contiguous axial images were obtained from  the base of the skull through the vertex without intravenous contrast. COMPARISON:  Head CT 07/01/2010 FINDINGS: Brain: No intracranial hemorrhage, mass effect, or midline shift. No hydrocephalus. The basilar cisterns are patent. No evidence of acute ischemia. No extra-axial or intracranial fluid collection. Vascular: No hyperdense vessel or unexpected calcification. Skull: No skull fracture or focal lesion. Sinuses/Orbits: Mucosal thickening in the ethmoid air cells. Mastoid air cells are clear. Visualized orbits are normal. Other: None. IMPRESSION: No acute intracranial abnormality. Electronically Signed   By: Jeb Levering M.D.   On: 01/16/2017 00:37   Ct Angio Chest Pe W And/or Wo Contrast  Result Date: 01/16/2017 CLINICAL DATA:  Unresponsive EXAM: CT ANGIOGRAPHY CHEST WITH CONTRAST TECHNIQUE: Multidetector CT imaging of the chest was performed using the standard protocol during bolus administration of intravenous contrast. Multiplanar CT image reconstructions and MIPs were obtained to evaluate the vascular anatomy. CONTRAST:  80 cc Isovue 370 intravenous COMPARISON:  Radiograph 01/15/2017, CT chest 11/23/2015 FINDINGS: Cardiovascular: For distal branch opacification. No acute central filling defect is visualized. Non aneurysmal aorta. No dissection is seen. Borderline heart size. No large pericardial effusion. Mediastinum/Nodes: Endotracheal tube about 2.7 cm superior to the carina. Esophageal tube tip distal stomach. No thyroid mass. Few prominent mediastinal lymph nodes, a lymph node adjacent to the large measures 11 mm. Right hilar nodes up to 11 mm. Lungs/Pleura: Negative for pneumothorax. Multifocal consolidations and ground-glass densities within the upper and lower lobes. Trace pleural effusions. Upper Abdomen: No acute abnormality in the upper abdomen. Musculoskeletal: No chest wall abnormality. No acute or significant osseous findings. Degenerative changes. Review of the MIP images confirms the  above findings. IMPRESSION: 1. Limited evaluation for distal segmental and subsegmental emboli due to extensive consolidations and suboptimal opacification of distal branch vessels. No acute central PE is seen. 2. Multifocal consolidations and ground-glass densities within the upper and lower lobes, concerning for multifocal pneumonia, cannot exclude aspiration at the lower lobes. 3. Trace pleural effusions. Electronically Signed   By: Donavan Foil M.D.   On: 01/16/2017 00:55   Dg Chest Port 1 View  Result Date: 01/16/2017 CLINICAL DATA:  Central line EXAM: PORTABLE CHEST 1  VIEW COMPARISON:  01/15/2017 FINDINGS: Endotracheal tube tip is approximately 3 cm superior to carina. Esophageal tube to is below the diaphragm. Placement of right-sided central venous catheter with tip overlying the low right atrium. No pneumothorax is seen. Low lung volumes with bilateral pulmonary infiltrates. Cardiomegaly. IMPRESSION: 1. Right-sided central venous catheter tip overlies the low right atrium. Negative for pneumothorax 2. Low lung volumes with continued moderate bilateral pulmonary infiltrates 3. Cardiomegaly Electronically Signed   By: Donavan Foil M.D.   On: 01/16/2017 02:26   Dg Chest Portable 1 View  Result Date: 01/15/2017 CLINICAL DATA:  Unresponsive, intubated EXAM: PORTABLE CHEST 1 VIEW COMPARISON:  12/05/2015 FINDINGS: Endotracheal tube tip is about 2.6 cm superior to the carina. Low lung volumes. Esophageal tube tip is below the diaphragm but is not included. Linear scarring at the right base. Increased opacity in the left upper lobe and left lung base. Mild cardiomegaly. No pneumothorax. IMPRESSION: 1. Endotracheal tube tip about 2.6 cm superior to the carina 2. Left upper lobe and left lung base airspace opacities concerning for pneumonia 3. Linear scarring at the right base 4. Cardiomegaly Electronically Signed   By: Donavan Foil M.D.   On: 01/15/2017 23:40    Results/Tests Pending at Time of  Discharge: None  Discharge Medications:  Allergies as of 01/20/2017      Reactions   Pregabalin Anaphylaxis   Hydrocodone Itching, Rash   Citalopram Other (See Comments)   Worsened anxiety   Ibuprofen Other (See Comments)   Stomach pain   Naproxen Swelling, Other (See Comments)   troat and fingers swelling    Povidone-iodine Itching, Rash      Medication List    TAKE these medications   amoxicillin-clavulanate 875-125 MG tablet Commonly known as:  AUGMENTIN Take 1 tablet by mouth 2 (two) times daily.   cyclobenzaprine 10 MG tablet Commonly known as:  FLEXERIL Take 1 tablet (10 mg total) by mouth 3 (three) times daily.   gabapentin 300 MG capsule Commonly known as:  NEURONTIN TAKE 1 CAPSULE BY MOUTH 3 TIMES DAILY What changed:  See the new instructions.   ibuprofen 600 MG tablet Commonly known as:  ADVIL,MOTRIN Take 1 tablet (600 mg total) by mouth every 6 (six) hours as needed. What changed:  reasons to take this   lactulose 10 g packet Commonly known as:  CEPHULAC Take 1 packet (10 g total) by mouth 3 (three) times daily.   levofloxacin 500 MG tablet Commonly known as:  LEVAQUIN Take 1 tablet (500 mg total) by mouth daily.   levothyroxine 50 MCG tablet Commonly known as:  SYNTHROID, LEVOTHROID TAKE ONE TABLET BY MOUTH EVERY DAY What changed:  See the new instructions.   lithium carbonate 300 MG capsule Take 1 capsule (300 mg total) by mouth 2 (two) times daily. What changed:  how much to take  additional instructions   methadone 10 MG tablet Commonly known as:  DOLOPHINE Take 110 mg by mouth daily.   nicotine 21 mg/24hr patch Commonly known as:  NICODERM CQ - dosed in mg/24 hours Place 1 patch (21 mg total) onto the skin daily.   polyethylene glycol packet Commonly known as:  MIRALAX / GLYCOLAX Take 17 g by mouth 2 (two) times daily.   QUEtiapine 300 MG 24 hr tablet Commonly known as:  SEROQUEL XR Take 1 tablet (300 mg total) by mouth at  bedtime.            Discharge Care Instructions  Start     Ordered   01/20/17 0000  levofloxacin (LEVAQUIN) 500 MG tablet  Daily     01/20/17 1125   01/20/17 0000  lithium carbonate 300 MG capsule  2 times daily     01/20/17 1125   01/20/17 0000  amoxicillin-clavulanate (AUGMENTIN) 875-125 MG tablet  2 times daily     01/20/17 1125      Discharge Instructions: Please refer to Patient Instructions section of EMR for full details.  Patient was counseled important signs and symptoms that should prompt return to medical care, changes in medications, dietary instructions, activity restrictions, and follow up appointments.   Follow-Up Appointments:   Sherene Sires, DO 01/20/2017, 7:29 PM PGY-1, [place]

## 2017-01-19 NOTE — Progress Notes (Signed)
Pt states her NSL in rt a/c is burning and pt requesting for it to be removed. Removed NSL per pt request. Pt doesn't want NSL restarted due to her stating she is probably leaving in the morning. Will continue to monitor pt. Ranelle Oyster, RN

## 2017-01-19 NOTE — Progress Notes (Signed)
Patient transferred from 24M with suicide sitter in place and all suicide precautions in place. Telemetry box 21 and pulse oximetry applied and verified. Vital signs taken and are stable. Skin assessed and second verified by Simone Curia. Will continue to monitor and treat per MD orders.

## 2017-01-19 NOTE — Progress Notes (Addendum)
Nutrition Follow-up  DOCUMENTATION CODES:   Morbid obesity  INTERVENTION:   -Ensure Enlive po q HS, each supplement provides 350 kcal and 20 grams of protein  NUTRITION DIAGNOSIS:   Inadequate oral intake related to acute illness as evidenced by meal completion < 50%.  Progressing  GOAL:   Patient will meet greater than or equal to 90% of their needs  Progressing  MONITOR:   PO intake, Supplement acceptance, Labs, Weight trends, Skin, I & O's  REASON FOR ASSESSMENT:   Consult Enteral/tube feeding initiation and management  ASSESSMENT:   46 y/o female PMHx Anxiety, Bipolar disorder, Hep B, DM. Presented via EMS after doing illegal drugs. Found unresponsive and intubated in ED. Afterwards, developed septic shock w/ ALI. LIkely aspiration PNA. RD consulted for tube feeding  8/19- extubated 8/21- transferred from ICU to medical floor  Pt in with RN, sitter, and multiple family members at time of visit. Pt now on a regular diet with variable intake; PO: 25-75%.   Labs reviewed.   Diet Order:  Diet regular Room service appropriate? Yes; Fluid consistency: Thin  Skin:  Reviewed, no issues  Last BM:  01/18/17  Height:   Ht Readings from Last 1 Encounters:  01/16/17 5\' 4"  (1.626 m)    Weight:   Wt Readings from Last 1 Encounters:  01/19/17 243 lb 3.2 oz (110.3 kg)    Ideal Body Weight:  54.54 kg  BMI:  Body mass index is 41.75 kg/m.  Estimated Nutritional Needs:   Kcal:  1600-1800  Protein:  80-95 grams  Fluid:  1.6-1.8 L  EDUCATION NEEDS:   No education needs identified at this time  [place] A. Jimmye Norman, RD, LDN, CDE Pager: [phone] After hours Pager: [phone]

## 2017-01-20 ENCOUNTER — Inpatient Hospital Stay (HOSPITAL_COMMUNITY): Payer: Medicaid Other

## 2017-01-20 DIAGNOSIS — Z915 Personal history of self-harm: Secondary | ICD-10-CM

## 2017-01-20 DIAGNOSIS — Z9151 Personal history of suicidal behavior: Secondary | ICD-10-CM

## 2017-01-20 LAB — BASIC METABOLIC PANEL
ANION GAP: 7 (ref 5–15)
BUN: 5 mg/dL — ABNORMAL LOW (ref 6–20)
CALCIUM: 8.4 mg/dL — AB (ref 8.9–10.3)
CO2: 25 mmol/L (ref 22–32)
Chloride: 108 mmol/L (ref 101–111)
Creatinine, Ser: 0.72 mg/dL (ref 0.44–1.00)
GFR calc Af Amer: >60 mL/min (ref >60)
GLUCOSE: 95 mg/dL (ref 65–99)
Potassium: 3.3 mmol/L — ABNORMAL LOW (ref 3.5–5.1)
Sodium: 140 mmol/L (ref 135–145)

## 2017-01-20 LAB — CBC
HCT: 35.5 % — ABNORMAL LOW (ref 36.0–46.0)
Hemoglobin: 11.3 g/dL — ABNORMAL LOW (ref 12.0–15.0)
MCH: 27.6 pg (ref 26.0–34.0)
MCHC: 31.8 g/dL (ref 30.0–36.0)
MCV: 86.6 fL (ref 78.0–100.0)
PLATELETS: 227 10*3/uL (ref 150–400)
RBC: 4.1 MIL/uL (ref 3.87–5.11)
RDW: 15.1 % (ref 11.5–15.5)
WBC: 8.4 10*3/uL (ref 4.0–10.5)

## 2017-01-20 MED ORDER — LITHIUM CARBONATE 300 MG PO CAPS: 300.0000 mg | ORAL_CAPSULE | Freq: Two times a day (BID) | ORAL | 0 refills | Status: DC

## 2017-01-20 MED ORDER — LEVOFLOXACIN 500 MG PO TABS: 500.0000 mg | ORAL_TABLET | Freq: Every day | ORAL | 0 refills | Status: AC

## 2017-01-20 MED ORDER — POTASSIUM CHLORIDE CRYS ER 20 MEQ PO TBCR
20.0000 meq | EXTENDED_RELEASE_TABLET | Freq: Once | ORAL | Status: DC
  Administered 2017-01-20: 20 meq via ORAL
  Filled 2017-01-20: qty 1

## 2017-01-20 MED ORDER — CALCIUM CARBONATE-VITAMIN D 500-200 MG-UNIT PO TABS
1.0000 | ORAL_TABLET | Freq: Two times a day (BID) | ORAL | Status: DC
  Administered 2017-01-20: 10:00:00 1 via ORAL
  Filled 2017-01-20: qty 1

## 2017-01-20 MED ORDER — PNEUMOCOCCAL VAC POLYVALENT 25 MCG/0.5ML IJ INJ: 0.5000 mL | INJECTION | INTRAMUSCULAR | Status: DC

## 2017-01-20 MED ORDER — AMOXICILLIN-POT CLAVULANATE 875-125 MG PO TABS: 1.0000 | ORAL_TABLET | Freq: Two times a day (BID) | ORAL | 0 refills | Status: AC

## 2017-01-20 MED ORDER — POTASSIUM CHLORIDE CRYS ER 20 MEQ PO TBCR
40.0000 meq | EXTENDED_RELEASE_TABLET | Freq: Once | ORAL | Status: AC
  Administered 2017-01-20: 40 meq via ORAL
  Filled 2017-01-20: qty 2

## 2017-01-20 NOTE — Progress Notes (Signed)
Family Medicine Teaching Service Daily Progress Note Intern Pager: [phone]  Patient name: Kylie [name] Medical record number: [id number] Date of birth: 1970-12-05 Age: 46 y.o. Gender: female  Primary Care Provider: Zenia Resides, MD Consultants: psych Code Status: full  Pt Overview and Major Events to Date:  46 yo F with Hx of bipolar disorder and chronic back pain on methadone presented to EMS 8/17 unresponsive with minimal response to narcan. Intubated for airway protection. Developed hypotension afterwards with possible aspiration pneumonia.   Extubated 8/20 and transferred to med-surg.  Assessment and Plan: 46 yo F with Hx of bipolar disorder and chronic back pain on methadone presented to EMS 8/17 unresponsive with minimal response to narcan.  Suspected drug overdose- + for cocaine, opiate, benzo.stabilized and now extubated.  Patient says she does not remember using drugs "I thought I was done with that".  Ciwa 0. -psych signed off for outpatient treatment w/ methadone clinic -methadone 10mg  -consult SW for rehab resources -will check CIWAs for at least 24hours  Suicidal risk: history of suicide attempt and loss of multiple family members to suicide.  Psych has signed off for outpatient followup with [facility], patient refuses psych referal.  patient denies ideation of selfharm or to harm others -sitter with patient -will need appt. With [facility]  Suspected aspiration pneumonia/vsCOOP/vsards-cultures negative to date.  CXR shows minimal improvement to 8/21 from 8/20 -augmentin (day 3 of 5) per pharm -levoquin (day 2 of 7)  Bipolar- QT reduced to 438 per ecg 8/21 -lithium (reduced to 300 BID due to concern of prior noncompliance) -lithium level check on 8/25 -ecg -psych suggests seroquel 300xr after evaluating QT  Hx of suicide attempt - denies SI/HI, psych suggests outpatient followup -outpatient referal -will discuss need for sitter while inpatent  Hypocalcemia-  8.4 -oscal w/ vitD X2  Hypokalemia- 3.3 -Kdur 20mg   Anemia- Hgb 11.3, steady between 10-11.5 since 8/18 -no intervention currently needed, will trend  Hypothyroidism -home synthroid  FEN/GI: famotidine, lactulose PPx: lovenox  Disposition: home vs rehab upon medical stabilization  Subjective:  Felt her breathing was fine and denied SI/HI.   She thought she was not getting enough methadone in the hospital and was frustrated with that.   She wants to follow with the [facility] clinic but wants to make her own appt due to concerns of a reliable ride.  Objective: Temp:  [97.7 F (36.5 C)-98.1 F (36.7 C)] 97.7 F (36.5 C) (08/22 0434) Pulse Rate:  [69-72] 72 (08/22 0434) Resp:  [16] 16 (08/22 0434) BP: (112-117)/(61-70) 112/61 (08/22 0434) SpO2:  [93 %-98 %] 93 % (08/22 0434) Physical Exam: General: NAD, alert and conversational, obese Cardiovascular: 2/6 murmurs but difficult to auscultate due to louder breath sounds, heart rate seemed RRR Respiratory: Lung sounds CTA, no wheezing, no IWB  Abdomen: soft with no TTP, bowel sounds throughout Extremities: able to sit unaided, no deficits noted Psych: denies SI/HI  Laboratory:  Recent Labs Lab 01/18/17 0423 01/19/17 0733 01/20/17 0427  WBC 6.7 7.9 8.4  HGB 10.0* 11.5* 11.3*  HCT 32.2* 35.3* 35.5*  PLT 187 214 227    Recent Labs Lab 01/15/17 2315  01/18/17 0423 01/19/17 0609 01/20/17 0427  NA 139  < > 142 139 140  K 4.2  < > 3.5 3.8 3.3*  CL 107  < > 110 109 108  CO2 23  < > 28 24 25   BUN 14  < > 8 8 5*  CREATININE 1.03*  < > 0.72 0.75  0.72  CALCIUM 7.6*  < > 7.7* 8.2* 8.4*  PROT 6.1*  --   --   --   --   BILITOT 0.5  --   --   --   --   ALKPHOS 84  --   --   --   --   ALT 17  --   --   --   --   AST 22  --   --   --   --   GLUCOSE 258*  < > 77 78 95  < > = values in this interval not displayed.    Imaging/Diagnostic Tests: Ct Head Wo Contrast  Result Date: 01/16/2017 CLINICAL DATA:  Altered level of  consciousness. EXAM: CT HEAD WITHOUT CONTRAST TECHNIQUE: Contiguous axial images were obtained from the base of the skull through the vertex without intravenous contrast. COMPARISON:  Head CT 07/01/2010 FINDINGS: Brain: No intracranial hemorrhage, mass effect, or midline shift. No hydrocephalus. The basilar cisterns are patent. No evidence of acute ischemia. No extra-axial or intracranial fluid collection. Vascular: No hyperdense vessel or unexpected calcification. Skull: No skull fracture or focal lesion. Sinuses/Orbits: Mucosal thickening in the ethmoid air cells. Mastoid air cells are clear. Visualized orbits are normal. Other: None. IMPRESSION: No acute intracranial abnormality. Electronically Signed   By: Jeb Levering M.D.   On: 01/16/2017 00:37   Ct Angio Chest Pe W And/or Wo Contrast  Result Date: 01/16/2017 CLINICAL DATA:  Unresponsive EXAM: CT ANGIOGRAPHY CHEST WITH CONTRAST TECHNIQUE: Multidetector CT imaging of the chest was performed using the standard protocol during bolus administration of intravenous contrast. Multiplanar CT image reconstructions and MIPs were obtained to evaluate the vascular anatomy. CONTRAST:  80 cc Isovue 370 intravenous COMPARISON:  Radiograph 01/15/2017, CT chest 11/23/2015 FINDINGS: Cardiovascular: For distal branch opacification. No acute central filling defect is visualized. Non aneurysmal aorta. No dissection is seen. Borderline heart size. No large pericardial effusion. Mediastinum/Nodes: Endotracheal tube about 2.7 cm superior to the carina. Esophageal tube tip distal stomach. No thyroid mass. Few prominent mediastinal lymph nodes, a lymph node adjacent to the large measures 11 mm. Right hilar nodes up to 11 mm. Lungs/Pleura: Negative for pneumothorax. Multifocal consolidations and ground-glass densities within the upper and lower lobes. Trace pleural effusions. Upper Abdomen: No acute abnormality in the upper abdomen. Musculoskeletal: No chest wall abnormality. No  acute or significant osseous findings. Degenerative changes. Review of the MIP images confirms the above findings. IMPRESSION: 1. Limited evaluation for distal segmental and subsegmental emboli due to extensive consolidations and suboptimal opacification of distal branch vessels. No acute central PE is seen. 2. Multifocal consolidations and ground-glass densities within the upper and lower lobes, concerning for multifocal pneumonia, cannot exclude aspiration at the lower lobes. 3. Trace pleural effusions. Electronically Signed   By: Donavan Foil M.D.   On: 01/16/2017 00:55   Dg Chest Port 1 View  Result Date: 01/16/2017 CLINICAL DATA:  Central line EXAM: PORTABLE CHEST 1 VIEW COMPARISON:  01/15/2017 FINDINGS: Endotracheal tube tip is approximately 3 cm superior to carina. Esophageal tube to is below the diaphragm. Placement of right-sided central venous catheter with tip overlying the low right atrium. No pneumothorax is seen. Low lung volumes with bilateral pulmonary infiltrates. Cardiomegaly. IMPRESSION: 1. Right-sided central venous catheter tip overlies the low right atrium. Negative for pneumothorax 2. Low lung volumes with continued moderate bilateral pulmonary infiltrates 3. Cardiomegaly Electronically Signed   By: Donavan Foil M.D.   On: 01/16/2017 02:26   Dg Chest  Portable 1 View  Result Date: 01/15/2017 CLINICAL DATA:  Unresponsive, intubated EXAM: PORTABLE CHEST 1 VIEW COMPARISON:  12/05/2015 FINDINGS: Endotracheal tube tip is about 2.6 cm superior to the carina. Low lung volumes. Esophageal tube tip is below the diaphragm but is not included. Linear scarring at the right base. Increased opacity in the left upper lobe and left lung base. Mild cardiomegaly. No pneumothorax. IMPRESSION: 1. Endotracheal tube tip about 2.6 cm superior to the carina 2. Left upper lobe and left lung base airspace opacities concerning for pneumonia 3. Linear scarring at the right base 4. Cardiomegaly Electronically  Signed   By: Donavan Foil M.D.   On: 01/15/2017 23:40     Sherene Sires, DO 01/20/2017, 6:53 AM PGY-1, [place] Intern pager: [phone], text pages welcome

## 2017-01-20 NOTE — Discharge Instructions (Signed)
You were seen in the hospital for a drug overdose that you do not remember.   It is very important to avoid substance abuse in the future.   You have stated that you will re-establish your treatment at the methadone clinic so please followthrough on that.   You have refused to have an appt scheduled for you there or at the [facility] clinic for primary care because you want to confirm your ride availability and arrange it yourself.  Please finish your antibiotics course as prescribed since you had a serious lung infection.  You have denied suicidal ideation consistently while here and told us you were safe.   If for any reason that changes, please seek medical attention immediately so we can give you the help you need.

## 2017-01-21 LAB — CULTURE, BLOOD (ROUTINE X 2)
Culture: NO GROWTH
SPECIAL REQUESTS: ADEQUATE

## 2017-01-25 ENCOUNTER — Other Ambulatory Visit: Payer: Self-pay | Admitting: Family Medicine

## 2017-01-25 DIAGNOSIS — G8929 Other chronic pain: Secondary | ICD-10-CM

## 2017-01-25 DIAGNOSIS — M545 Low back pain, unspecified: Secondary | ICD-10-CM

## 2017-01-27 ENCOUNTER — Telehealth: Payer: Self-pay | Admitting: *Deleted

## 2017-01-27 NOTE — Telephone Encounter (Signed)
Caller from [facility] (no name left) wanted to let the provider know that there are some med compliance issues with the following meds:  Cyclobenzaprine Seroquel Levaquin  Did not give more info than that.  Requested that we call patient to followup.  WIll forward to PCP and Howell Rucks, RN who helps with Transitions of Care.  Kylie [name], Salome Spotted, CMA

## 2017-01-27 NOTE — Telephone Encounter (Signed)
Attempted to call patient.  No answer.  I will likely try to call again - mostly to touch base after hospitalization.  The vague information we received from the caller is neither professional nor actionable.

## 2017-01-27 NOTE — Telephone Encounter (Signed)
Called patient.  She wanted me to supply a letter about seat belts.  States she is doing well.  Back at [facility].  States that she has all her DC meds.  Does not currently have an appointment.  She will make one with me and we will discuss the letter further at that time.

## 2017-01-28 ENCOUNTER — Telehealth: Payer: Self-pay

## 2017-01-28 NOTE — Telephone Encounter (Signed)
Patient needs a prescription for lice shampoo ASAP. Marland KitchenOzella Almond

## 2017-01-29 MED ORDER — PERMETHRIN 1 % EX LOTN: TOPICAL_LOTION | CUTANEOUS | 0 refills | Status: DC

## 2017-01-29 NOTE — Telephone Encounter (Signed)
Will prescribe without visit.

## 2017-02-03 ENCOUNTER — Inpatient Hospital Stay: Payer: Self-pay | Admitting: Family Medicine

## 2017-02-10 ENCOUNTER — Ambulatory Visit (INDEPENDENT_AMBULATORY_CARE_PROVIDER_SITE_OTHER): Payer: Medicaid Other | Admitting: Family Medicine

## 2017-02-10 ENCOUNTER — Encounter: Payer: Self-pay | Admitting: Family Medicine

## 2017-02-10 DIAGNOSIS — F17200 Nicotine dependence, unspecified, uncomplicated: Secondary | ICD-10-CM | POA: Diagnosis not present

## 2017-02-10 DIAGNOSIS — Z23 Encounter for immunization: Secondary | ICD-10-CM

## 2017-02-10 DIAGNOSIS — B852 Pediculosis, unspecified: Secondary | ICD-10-CM | POA: Diagnosis not present

## 2017-02-10 DIAGNOSIS — F3160 Bipolar disorder, current episode mixed, unspecified: Secondary | ICD-10-CM | POA: Diagnosis not present

## 2017-02-10 DIAGNOSIS — Z87898 Personal history of other specified conditions: Secondary | ICD-10-CM | POA: Diagnosis not present

## 2017-02-10 DIAGNOSIS — F1911 Other psychoactive substance abuse, in remission: Secondary | ICD-10-CM

## 2017-02-10 MED ORDER — PERMETHRIN 5 % EX CREA
1.0000 | TOPICAL_CREAM | Freq: Once | CUTANEOUS | 0 refills | Status: AC
Start: 2017-02-10 — End: 2017-02-10

## 2017-02-10 NOTE — Assessment & Plan Note (Signed)
Encouraged to cut back and set quit date.

## 2017-02-10 NOTE — Assessment & Plan Note (Signed)
Rx permethrin

## 2017-02-10 NOTE — Progress Notes (Signed)
   Subjective:    Patient ID: Kylie [name], female    DOB: [birth year], 46 y.o.   MRN: [id number]  HPI FU hospitalization, unintentional OD.  Had resp failure.  Now back on all home meds including methadone 110 mg from [facility].  States she is clean and sober.  She was seriously scared about almost dying.  Hopefully scared straight.  Still with lingering cough but slowly improving.  Smoker.  She is now cutting back and states that she intends to quit.   Needs a letter about not wearing a seatbelt, for which she was ticketed and is facing a $180 fine.   Exposed to lice.  Could not afford OTC treatment.  Asks for 5% permethrin.   Review of Systems     Objective:   Physical Exam  Lungs scattered coarse rhonci. Cardiac RRR without m or g Abd benign.        Assessment & Plan:

## 2017-02-10 NOTE — Assessment & Plan Note (Signed)
Hopefully she will stay in remission.

## 2017-02-10 NOTE — Assessment & Plan Note (Signed)
In good spirits today.  

## 2017-02-10 NOTE — Patient Instructions (Signed)
See me in three months. Stay clean and sober.   You have people who love you.

## 2017-02-19 ENCOUNTER — Other Ambulatory Visit: Payer: Self-pay | Admitting: *Deleted

## 2017-02-19 MED ORDER — PERMETHRIN 5 % EX CREA: 1.0000 "application " | TOPICAL_CREAM | Freq: Once | CUTANEOUS | 0 refills | Status: AC

## 2017-02-19 NOTE — Telephone Encounter (Signed)
Patient left message on nurse line requesting a refill on medication for head lice.  Derl Barrow, RN

## 2017-02-22 ENCOUNTER — Other Ambulatory Visit: Payer: Self-pay | Admitting: Family Medicine

## 2017-04-08 ENCOUNTER — Telehealth: Payer: Self-pay

## 2017-04-08 DIAGNOSIS — B852 Pediculosis, unspecified: Secondary | ICD-10-CM

## 2017-04-08 MED ORDER — PERMETHRIN 5 % EX CREA: 1.0000 "application " | TOPICAL_CREAM | Freq: Once | CUTANEOUS | 0 refills | Status: AC

## 2017-04-08 NOTE — Telephone Encounter (Signed)
Pt would like 2 bottles because her hair is so long. Fleeger, Salome Spotted, CMA

## 2017-04-08 NOTE — Telephone Encounter (Signed)
Patient says that she needs a prescription for lice shampoo sen to her pharmacy.Kylie [name]

## 2017-04-08 NOTE — Telephone Encounter (Signed)
Done

## 2017-04-12 ENCOUNTER — Other Ambulatory Visit: Payer: Self-pay

## 2017-04-12 ENCOUNTER — Emergency Department (HOSPITAL_BASED_OUTPATIENT_CLINIC_OR_DEPARTMENT_OTHER)
Admission: EM | Admit: 2017-04-12 | Discharge: 2017-04-12 | Discharge status: Against Medical Advice | Payer: Medicaid Other | Attending: Emergency Medicine | Admitting: Emergency Medicine

## 2017-04-12 ENCOUNTER — Encounter (HOSPITAL_BASED_OUTPATIENT_CLINIC_OR_DEPARTMENT_OTHER): Payer: Self-pay

## 2017-04-12 ENCOUNTER — Telehealth: Payer: Self-pay | Admitting: *Deleted

## 2017-04-12 DIAGNOSIS — Z5321 Procedure and treatment not carried out due to patient leaving prior to being seen by health care provider: Secondary | ICD-10-CM | POA: Insufficient documentation

## 2017-04-12 DIAGNOSIS — R05 Cough: Secondary | ICD-10-CM | POA: Diagnosis not present

## 2017-04-12 NOTE — ED Triage Notes (Signed)
C/o flu like sx x 2 weeks-NAD-steady gait-states she did get flu shot this year

## 2017-04-12 NOTE — Telephone Encounter (Signed)
Message left on clinic nurse voice mail - Patient states she is very sick and went to ED, but long wait.  Requesting OV for today.  Returned call to patient for additional info.  Left message to call our office back.  Burna Forts, BSN, RN-BC

## 2017-04-13 ENCOUNTER — Ambulatory Visit (INDEPENDENT_AMBULATORY_CARE_PROVIDER_SITE_OTHER): Payer: Medicaid Other | Admitting: Family Medicine

## 2017-04-13 VITALS — BP 148/98 | HR 105 | Temp 98.6°F | Wt 243.0 lb

## 2017-04-13 DIAGNOSIS — H9202 Otalgia, left ear: Secondary | ICD-10-CM | POA: Diagnosis not present

## 2017-04-13 DIAGNOSIS — R05 Cough: Secondary | ICD-10-CM

## 2017-04-13 DIAGNOSIS — J029 Acute pharyngitis, unspecified: Secondary | ICD-10-CM

## 2017-04-13 DIAGNOSIS — R059 Cough, unspecified: Secondary | ICD-10-CM

## 2017-04-13 LAB — POCT RAPID STREP A (OFFICE): Rapid Strep A Screen: NEGATIVE

## 2017-04-13 MED ORDER — DOXYCYCLINE HYCLATE 100 MG PO TABS: 100.0000 mg | ORAL_TABLET | Freq: Two times a day (BID) | ORAL | 0 refills | Status: AC

## 2017-04-13 MED ORDER — BENZONATATE 100 MG PO CAPS: 100.0000 mg | ORAL_CAPSULE | Freq: Two times a day (BID) | ORAL | 0 refills | Status: AC | PRN

## 2017-04-13 NOTE — Progress Notes (Signed)
Subjective:     Patient ID: Kylie [name], female   DOB: 06-12-1970, 46 y.o.   MRN: [id number]  Cough  This is a new problem. The current episode started 1 to 4 weeks ago (Coughing for 3 weeks). The problem has been unchanged. The cough is non-productive (Difficult to get the phlemg out). Associated symptoms include chest pain, ear pain, a fever, headaches, nasal congestion and a sore throat. Pertinent negatives include no rash. Associated symptoms comments: Left ear pain. Nothing aggravates the symptoms. Risk factors for lung disease include smoking/tobacco exposure. Treatments tried: Mucinex,Theraflu. The treatment provided mild relief. There is no history of asthma, COPD or environmental allergies.  Sore Throat   This is a new problem. The current episode started 1 to 4 weeks ago (3 weeks. ). The problem has been unchanged. The maximum temperature recorded prior to her arrival was 101 - 101.9 F. Associated symptoms include coughing, ear pain and headaches. She has had no exposure to strep or mono. She has tried nothing for the symptoms.   Current Outpatient Medications on File Prior to Visit  Medication Sig Dispense Refill  . cyclobenzaprine (FLEXERIL) 10 MG tablet TAKE ONE TABLET BY MOUTH 3 TIMES DAILY 90 tablet 3  . gabapentin (NEURONTIN) 300 MG capsule TAKE 1 CAPSULE BY MOUTH 3 TIMES DAILY (Patient taking differently: Take 300 mg by mouth 3 times daily) 90 capsule 12  . ibuprofen (ADVIL,MOTRIN) 600 MG tablet Take 1 tablet (600 mg total) by mouth every 6 (six) hours as needed. (Patient taking differently: Take 600 mg by mouth every 6 (six) hours as needed for headache or moderate pain. ) 30 tablet 0  . lactulose (CEPHULAC) 10 g packet Take 1 packet (10 g total) by mouth 3 (three) times daily. 90 each 3  . levothyroxine (SYNTHROID, LEVOTHROID) 50 MCG tablet TAKE ONE TABLET BY MOUTH EVERY DAY (Patient taking differently: Take 50 mcg by mouth once daily) 90 tablet 3  . lithium carbonate 300 MG  capsule TAKE 1 CAPSULE BY MOUTH TWICE DAILY 60 capsule 12  . methadone (DOLOPHINE) 10 MG tablet Take 110 mg by mouth daily.     . nicotine (NICODERM CQ - DOSED IN MG/24 HOURS) 21 mg/24hr patch Place 1 patch (21 mg total) onto the skin daily. 28 patch 1  . polyethylene glycol (MIRALAX / GLYCOLAX) packet Take 17 g by mouth 2 (two) times daily. (Patient not taking: Reported on 10/14/2016) 100 each 12  . QUEtiapine (SEROQUEL XR) 300 MG 24 hr tablet Take 1 tablet (300 mg total) by mouth at bedtime. 90 tablet 3   No current facility-administered medications on file prior to visit.    Past Medical History:  Diagnosis Date  . Anxiety   . Bipolar 1 disorder ([place])   . Cervical cancer ([place])    Stage 2  . Chronic UTI   . Degenerative disk disease   . Diabetes mellitus without complication ([place])   . Hepatitis B infection   . Ovarian cyst   . Panic attack     Vitals:   04/13/17 0930  BP: (!) 148/98  Pulse: (!) 105  Temp: 98.6 F (37 C)  TempSrc: Oral  SpO2: 93%  Weight: 243 lb (110.2 kg)     Review of Systems  Constitutional: Positive for fever.  HENT: Positive for ear pain and sore throat.   Respiratory: Positive for cough.   Cardiovascular: Positive for chest pain.  Skin: Negative for rash.  Allergic/Immunologic: Negative for environmental allergies.  Neurological:  Positive for headaches.  All other systems reviewed and are negative.      Objective:   Physical Exam  Constitutional: She is oriented to person, place, and time. She appears well-developed. No distress.  HENT:  Right Ear: Tympanic membrane, external ear and ear canal normal.  Left Ear: Tympanic membrane, external ear and ear canal normal.  Mouth/Throat: Uvula is midline, oropharynx is clear and moist and mucous membranes are normal.  Eyes: Conjunctivae, EOM and lids are normal. Pupils are equal, round, and reactive to light.  Cardiovascular: Normal rate, regular rhythm and normal heart sounds.  No murmur  heard. Pulmonary/Chest: Effort normal and breath sounds normal. No respiratory distress. She has no wheezes.  Neurological: She is alert and oriented to person, place, and time.  Nursing note and vitals reviewed.      Assessment:     Cough Sore throat Ear pain    Plan:     1> Likely bronchitis vs atypical pneumonia.    Since this has been on going for 3 weeks, I will treat with A/B.    Doxycycline sent to pharmacy.    Tessalone prn cough.  2. Rapid strep test neg.     Tylenol as needed for pain.  3. Ear exam benign.     No ear infection.     Tylenol as needed for pain.  Return precaution discussed.

## 2017-04-13 NOTE — Patient Instructions (Signed)
Bronchitis  Acute Bronchitis, Adult Acute bronchitis is when air tubes (bronchi) in the lungs suddenly get swollen. The condition can make it hard to breathe. It can also cause these symptoms:  A cough.  Coughing up clear, yellow, or green mucus.  Wheezing.  Chest congestion.  Shortness of breath.  A fever.  Body aches.  Chills.  A sore throat. Follow these instructions at home: Medicines   Take over-the-counter and prescription medicines only as told by your doctor.  If you were prescribed an antibiotic medicine, take it as told by your doctor. Do not stop taking the antibiotic even if you start to feel better. General instructions   Rest.  Drink enough fluids to keep your pee (urine) clear or pale yellow.  Avoid smoking and secondhand smoke. If you smoke and you need help quitting, ask your doctor. Quitting will help your lungs heal faster.  Use an inhaler, cool mist vaporizer, or humidifier as told by your doctor.  Keep all follow-up visits as told by your doctor. This is important. How is this prevented? To lower your risk of getting this condition again:  Wash your hands often with soap and water. If you cannot use soap and water, use hand sanitizer.  Avoid contact with people who have cold symptoms.  Try not to touch your hands to your mouth, nose, or eyes.  Make sure to get the flu shot every year. Contact a doctor if:  Your symptoms do not get better in 2 weeks. Get help right away if:  You cough up blood.  You have chest pain.  You have very bad shortness of breath.  You become dehydrated.  You faint (pass out) or keep feeling like you are going to pass out.  You keep throwing up (vomiting).  You have a very bad headache.  Your fever or chills gets worse. This information is not intended to replace advice given to you by your health care provider. Make sure you discuss any questions you have with your health care provider. Document  Released: 11/04/2007 Document Revised: 12/25/2015 Document Reviewed: 11/06/2015 Elsevier Interactive Patient Education  2017 Elsevier Inc.  

## 2017-04-15 LAB — CULTURE, GROUP A STREP

## 2017-04-16 ENCOUNTER — Telehealth: Payer: Self-pay | Admitting: Family Medicine

## 2017-04-16 NOTE — Telephone Encounter (Signed)
HIPPA compliant message left for her to call back.  Note:  Throat culture positive for non-A beta hemolytic strep.  Likely covered by the antibiotic given.  Have patient return soon if still having symptoms.

## 2017-04-29 ENCOUNTER — Other Ambulatory Visit: Payer: Self-pay

## 2017-04-29 ENCOUNTER — Ambulatory Visit (INDEPENDENT_AMBULATORY_CARE_PROVIDER_SITE_OTHER): Payer: Medicaid Other | Admitting: Family Medicine

## 2017-04-29 ENCOUNTER — Encounter: Payer: Self-pay | Admitting: Family Medicine

## 2017-04-29 VITALS — BP 128/82 | HR 88 | Temp 98.0°F | Ht 64.0 in | Wt 236.0 lb

## 2017-04-29 DIAGNOSIS — R059 Cough, unspecified: Secondary | ICD-10-CM | POA: Insufficient documentation

## 2017-04-29 DIAGNOSIS — G8929 Other chronic pain: Secondary | ICD-10-CM

## 2017-04-29 DIAGNOSIS — R05 Cough: Secondary | ICD-10-CM | POA: Diagnosis not present

## 2017-04-29 DIAGNOSIS — F3160 Bipolar disorder, current episode mixed, unspecified: Secondary | ICD-10-CM

## 2017-04-29 DIAGNOSIS — F17200 Nicotine dependence, unspecified, uncomplicated: Secondary | ICD-10-CM | POA: Diagnosis not present

## 2017-04-29 DIAGNOSIS — M545 Low back pain, unspecified: Secondary | ICD-10-CM

## 2017-04-29 DIAGNOSIS — K5900 Constipation, unspecified: Secondary | ICD-10-CM

## 2017-04-29 DIAGNOSIS — Z23 Encounter for immunization: Secondary | ICD-10-CM | POA: Diagnosis not present

## 2017-04-29 MED ORDER — GABAPENTIN 300 MG PO CAPS: 600.0000 mg | ORAL_CAPSULE | Freq: Three times a day (TID) | ORAL | 12 refills | Status: DC

## 2017-04-29 NOTE — Assessment & Plan Note (Signed)
Will bump gabapentin which she feels helps with anxiety.

## 2017-04-29 NOTE — Assessment & Plan Note (Addendum)
Post infectious versus COPD.  Will check PFTs.  Might benefit from [place].  Given concern for COPD, will give pneumovax today.

## 2017-04-29 NOTE — Assessment & Plan Note (Signed)
Increase gabapentin  

## 2017-04-29 NOTE — Patient Instructions (Addendum)
See Dr. Valentina Lucks at your earliest convenience for pulmonary function tests to see if you have COPD. Increase your gabapentin to 2 capsules, three times per day.   You will get a pneumonia vaccine today.   Get back on the miralax. Keep working on the smoking cessation.

## 2017-04-29 NOTE — Assessment & Plan Note (Signed)
Regular dosing of miralax.

## 2017-04-29 NOTE — Progress Notes (Signed)
   Subjective:    Patient ID: Kylie [name], female    DOB: [birth year], 46 y.o.   MRN: [id number]  HPI Cough is slowly improving post hospitalization for severe (aspiration?) pneumonia and suspected drug OD.    Continues to have right lower chest discomfort.  Seen on 11/13 by [place] and rxed with doxy for pneumonia.  Slow improvement.  Never Dxed with COPD.  Currently cuttling back on smoking - down to a pack per day.  Has lifetime 45 pack year history.  Chronic low back pain.  Still bad.  No controled substances through our office.  Wants increase gabapentin.  Helps only a little for her chronic back pain  Helps considerably with anxiety.  Methadone maint.  Was on 110 at max daily.  Now down to 40 mg daily.    Constipation.  She is not regular with her miralax.    Review of Systems     Objective:   Physical Exam Lungs clear Cardiac RRR without m or g abd benign.       Assessment & Plan:

## 2017-04-29 NOTE — Assessment & Plan Note (Signed)
Down to 1/2 ppd.  Congratulated and encouraged complete cessation.

## 2017-05-01 ENCOUNTER — Other Ambulatory Visit: Payer: Self-pay | Admitting: Family Medicine

## 2017-05-01 DIAGNOSIS — M545 Low back pain, unspecified: Secondary | ICD-10-CM

## 2017-05-01 DIAGNOSIS — G8929 Other chronic pain: Secondary | ICD-10-CM

## 2017-05-13 ENCOUNTER — Ambulatory Visit: Payer: Self-pay | Admitting: Pharmacist

## 2017-05-20 ENCOUNTER — Other Ambulatory Visit: Payer: Self-pay | Admitting: Family Medicine

## 2017-05-20 NOTE — Telephone Encounter (Signed)
I do not know what the problem is - but this is the fourth permethrin prescritpion request since September.    Permethrin has important neurotoxicity.  She needs an appointment before additional refills.  I need to be sure that: 1. She actually has lice  2. She is doing the necessary environmental measures to prevent reinfection.

## 2017-08-04 ENCOUNTER — Ambulatory Visit: Payer: Self-pay | Admitting: Family Medicine

## 2017-08-11 ENCOUNTER — Telehealth: Payer: Self-pay | Admitting: *Deleted

## 2017-08-11 DIAGNOSIS — G8929 Other chronic pain: Secondary | ICD-10-CM

## 2017-08-11 DIAGNOSIS — M545 Low back pain: Principal | ICD-10-CM

## 2017-08-11 NOTE — Telephone Encounter (Signed)
Pt is requesting a referral to [facility] for her back and hips.  States that she cant stand more than "3 minutes which makes it impossible to exercise like Dr. Andria Frames wanted me to" . Sheila Ocasio, Salome Spotted, CMA

## 2017-08-12 NOTE — Telephone Encounter (Signed)
Referral entered as requested.  I am pessimistic that Kylie [name] will find what she is looking for.  Still, I want to be her advocate and thus am willing to refer.

## 2017-09-30 ENCOUNTER — Other Ambulatory Visit: Payer: Self-pay | Admitting: Family Medicine

## 2017-10-28 ENCOUNTER — Other Ambulatory Visit: Payer: Self-pay | Admitting: Family Medicine

## 2017-10-28 DIAGNOSIS — G8929 Other chronic pain: Secondary | ICD-10-CM

## 2017-10-28 DIAGNOSIS — M545 Low back pain, unspecified: Secondary | ICD-10-CM

## 2017-12-03 ENCOUNTER — Other Ambulatory Visit: Payer: Self-pay | Admitting: Family Medicine

## 2018-02-24 ENCOUNTER — Telehealth: Payer: Self-pay

## 2018-02-24 NOTE — Telephone Encounter (Signed)
Pt LVM on nurse line requesting to speak with one of [facility] nurses. No other information was given.

## 2018-02-25 NOTE — Telephone Encounter (Signed)
LVM for pt to call office back to inquire about below. Katharina Caper, Flara Storti D, [place]

## 2018-02-28 NOTE — Telephone Encounter (Signed)
Pt lmovm returning call to April. Fleeger, Kylie [name], CMA

## 2018-02-28 NOTE — Telephone Encounter (Signed)
I also tried to return the call, which went to voicemail.

## 2018-02-28 NOTE — Telephone Encounter (Signed)
No answer despite 3 calls by me.

## 2018-03-01 NOTE — Telephone Encounter (Signed)
4th call.  LM that she will need to call back with a time and number if she still wants to talk to me.  I do not intend to keep calling.

## 2018-03-01 NOTE — Telephone Encounter (Signed)
LVM to call office back.  Will mail letter requesting pt to contact our office due to multiple attempts to reach pt with no success. Katharina Caper, April D, [place]

## 2018-03-02 NOTE — Telephone Encounter (Signed)
Patient left angry voicemail about not receiving call back and stating she needs to speak to PCP regarding a private matter. Stated her phone number has changed and is [phone]. Added this number to chart.  Danley Danker, RN [facility] RN)

## 2018-03-03 NOTE — Telephone Encounter (Signed)
OK, this is my 5th call.  Called number provided, no answer, no voice mail set up.  I will try again.

## 2018-03-03 NOTE — Telephone Encounter (Signed)
Call #6. No answer.

## 2018-03-03 NOTE — Telephone Encounter (Signed)
Called and she needs appointment. Asks about pap.  Last pap normal 1.5 years ago.  But high risk HPV present and hx of abnormal pap.  Yes, pap due. Also needs an EKG for her methadone clinic. Agreed and she has appointment to see me.

## 2018-03-08 ENCOUNTER — Telehealth: Payer: Self-pay

## 2018-03-08 ENCOUNTER — Other Ambulatory Visit: Payer: Self-pay | Admitting: Family Medicine

## 2018-03-08 DIAGNOSIS — G8929 Other chronic pain: Secondary | ICD-10-CM

## 2018-03-08 DIAGNOSIS — M545 Low back pain, unspecified: Secondary | ICD-10-CM

## 2018-03-08 NOTE — Telephone Encounter (Signed)
Called, no answer and no voice mail.  Will try again later.

## 2018-03-08 NOTE — Telephone Encounter (Signed)
Pt called nurse line requesting to speak with PCP. Pt would like a referral to an orthopedic surgeon for her back pain. I have scheduled her an apt for 10/23, per last pcp note. Patient would like to know if pcp can refer her before her apt on 10/23. Pt is requesting a call back from pcp. Verified number [phone].

## 2018-03-09 NOTE — Telephone Encounter (Signed)
Called, no answer, no voicemail.   Patient did this last time and it took 6(?) phone calls by me to contact her.  I cannot continue this.  I will speak with her if she calls back leaves me a window of time where she will answer her phone. I did refill her gabapentin based on a pharmacy request. Patient does not get controled substances through this office due to repeated drug abuse.

## 2018-03-18 ENCOUNTER — Encounter: Payer: Self-pay | Admitting: Family Medicine

## 2018-03-23 ENCOUNTER — Ambulatory Visit: Payer: Medicaid Other | Admitting: Family Medicine

## 2018-04-01 DEATH — deceased

## 2019-08-21 IMAGING — CR DG CHEST 2V
2 series · 2 of 2 positions shown · non-contrast
Comparison: Yesterday

CLINICAL DATA: Dyspnea

EXAM:
CHEST  2 VIEW

[chest pa]
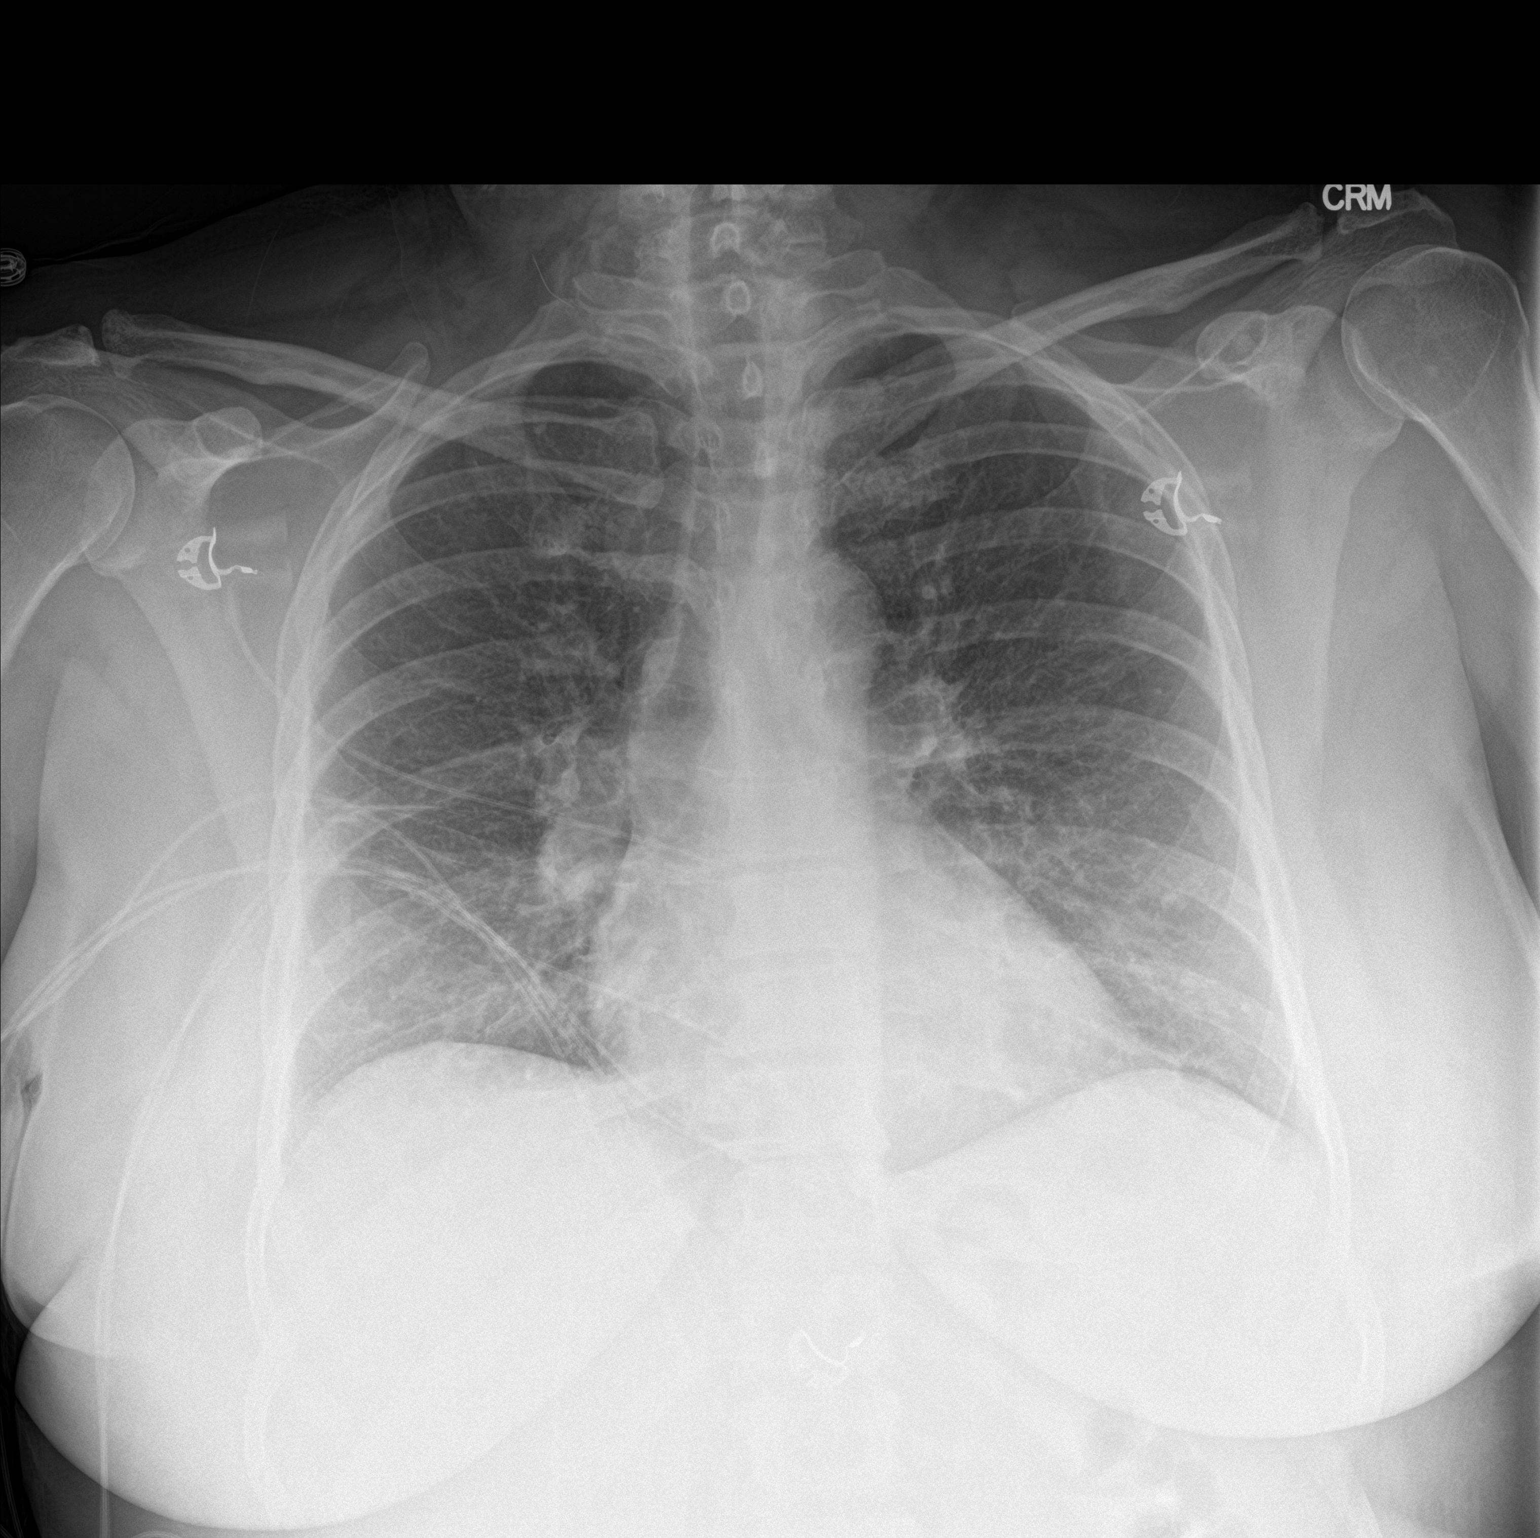

[chest lat]
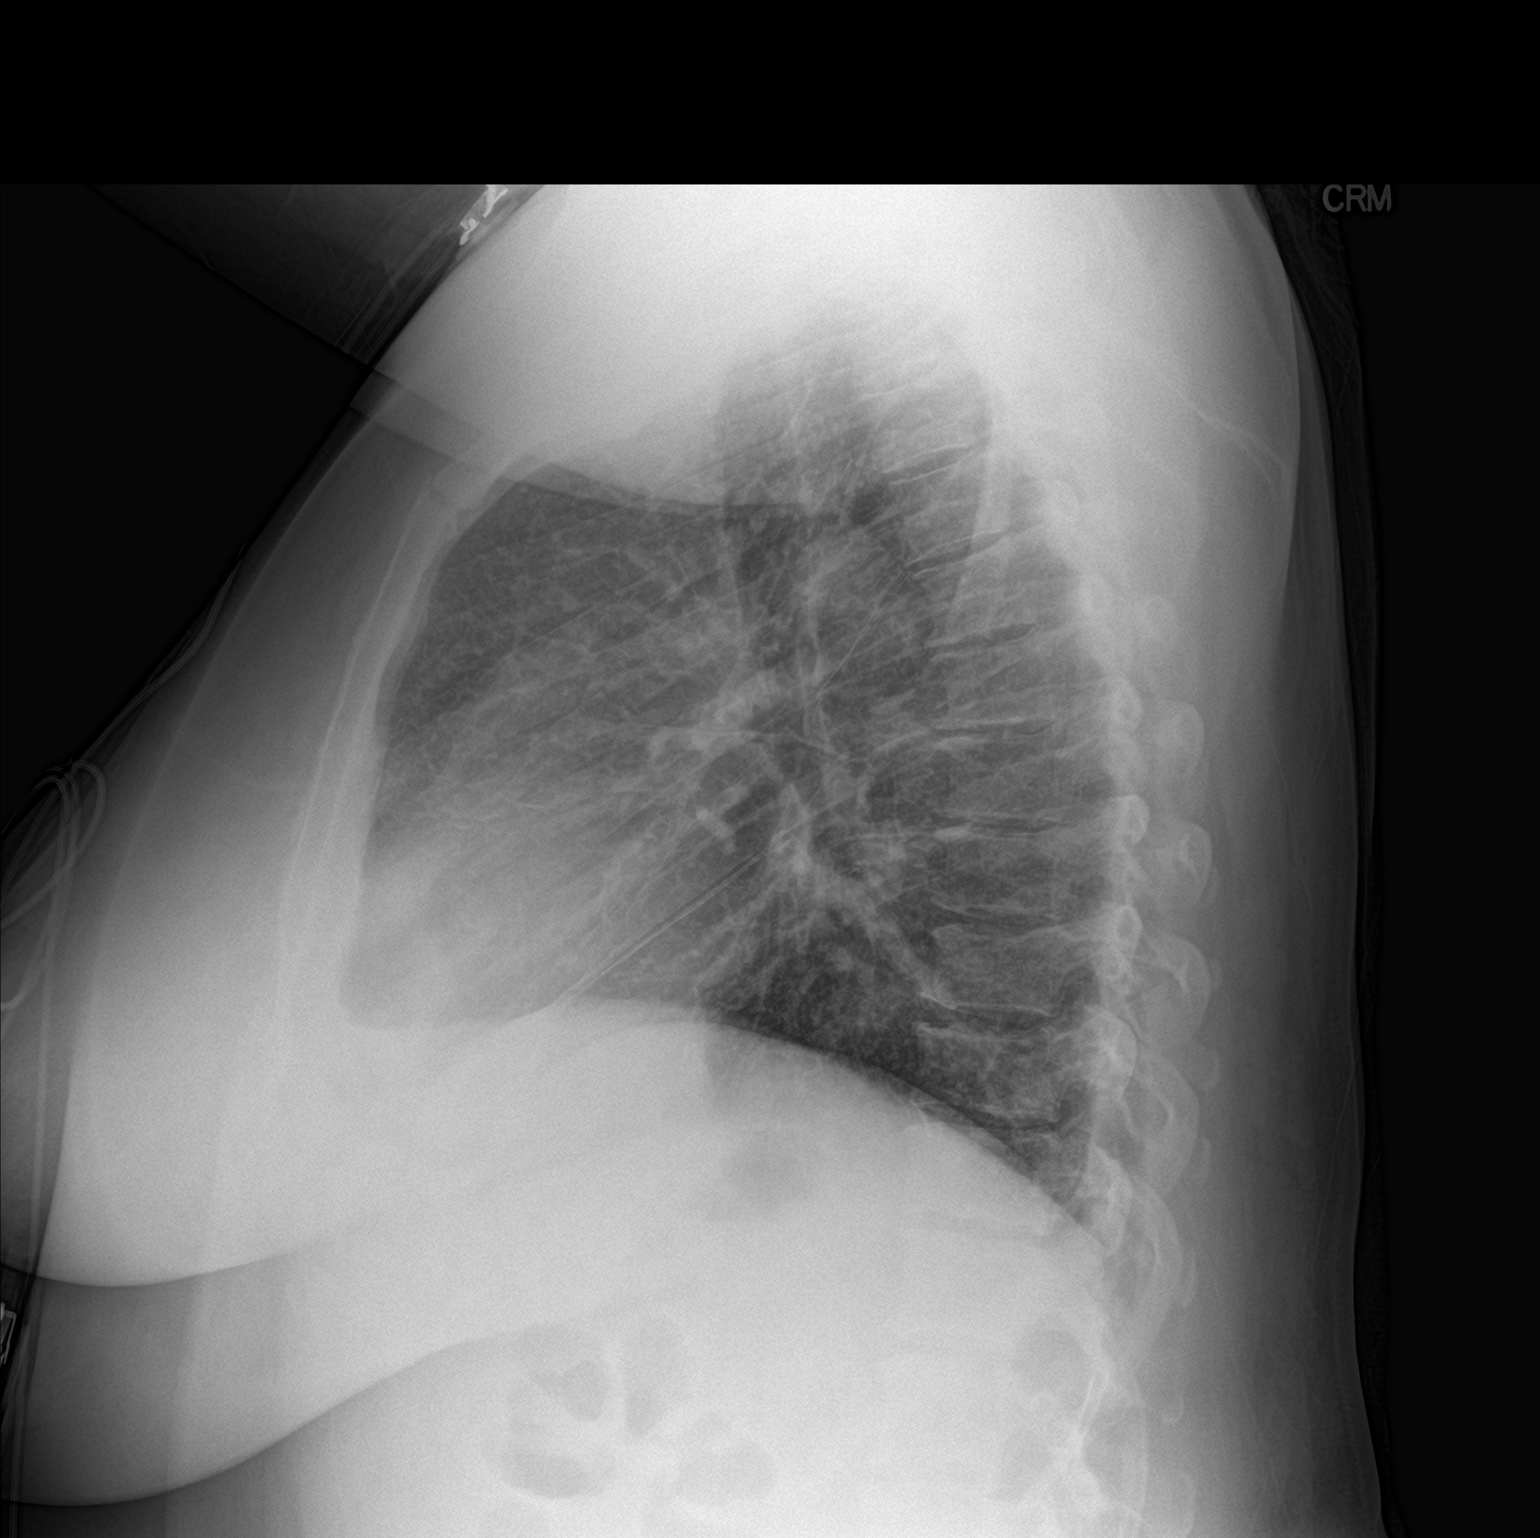

[2 of 2 positions shown; findings below may reference images not displayed]

FINDINGS: Generalized interstitial coarsening and minimal atelectasis. There
is no edema, consolidation, effusion, or pneumothorax. Normal heart
size and mediastinal contours. Chronic wirelike foreign body in the
lower sternocleidomastoid based on prior CT.
IMPRESSION: Bronchitic markings. Atelectasis and inflation has improved since
admission CT.
